# Patient Record
Sex: Female | Born: 1940 | Race: White | Hispanic: No | Smoking: Never smoker
Health system: Southern US, Community
[De-identification: ages and names within clinical notes are randomized; demographics above are authoritative.]

## PROBLEM LIST (undated history)

## (undated) DIAGNOSIS — F419 Anxiety disorder, unspecified: Secondary | ICD-10-CM

## (undated) DIAGNOSIS — G309 Alzheimer's disease, unspecified: Secondary | ICD-10-CM

## (undated) DIAGNOSIS — G252 Other specified forms of tremor: Secondary | ICD-10-CM

## (undated) DIAGNOSIS — G250 Essential tremor: Secondary | ICD-10-CM

## (undated) DIAGNOSIS — R269 Unspecified abnormalities of gait and mobility: Secondary | ICD-10-CM

## (undated) DIAGNOSIS — E785 Hyperlipidemia, unspecified: Secondary | ICD-10-CM

## (undated) DIAGNOSIS — I10 Essential (primary) hypertension: Secondary | ICD-10-CM

## (undated) DIAGNOSIS — F0280 Dementia in other diseases classified elsewhere without behavioral disturbance: Secondary | ICD-10-CM

## (undated) DIAGNOSIS — R413 Other amnesia: Principal | ICD-10-CM

## (undated) DIAGNOSIS — F0390 Unspecified dementia without behavioral disturbance: Secondary | ICD-10-CM

## (undated) DIAGNOSIS — S0300XA Dislocation of jaw, unspecified side, initial encounter: Secondary | ICD-10-CM

## (undated) DIAGNOSIS — K219 Gastro-esophageal reflux disease without esophagitis: Secondary | ICD-10-CM

## (undated) HISTORY — DX: Other amnesia: R41.3

## (undated) HISTORY — DX: Essential (primary) hypertension: I10

## (undated) HISTORY — DX: Unspecified abnormalities of gait and mobility: R26.9

## (undated) HISTORY — PX: RETINAL TEAR REPAIR CRYOTHERAPY: SHX5304

## (undated) HISTORY — DX: Essential tremor: G25.0

## (undated) HISTORY — DX: Hyperlipidemia, unspecified: E78.5

## (undated) HISTORY — DX: Dislocation of jaw, unspecified side, initial encounter: S03.00XA

## (undated) HISTORY — DX: Anxiety disorder, unspecified: F41.9

## (undated) HISTORY — DX: Unspecified dementia, unspecified severity, without behavioral disturbance, psychotic disturbance, mood disturbance, and anxiety: F03.90

## (undated) HISTORY — DX: Other specified forms of tremor: G25.2

## (undated) HISTORY — DX: Gastro-esophageal reflux disease without esophagitis: K21.9

---

## 2000-05-07 ENCOUNTER — Other Ambulatory Visit: Admission: RE | Admit: 2000-05-07 | Discharge: 2000-05-07 | Payer: Self-pay | Admitting: Obstetrics & Gynecology

## 2000-09-15 ENCOUNTER — Encounter: Admission: RE | Admit: 2000-09-15 | Discharge: 2000-09-15 | Payer: Self-pay | Admitting: Pulmonary Disease

## 2000-09-15 ENCOUNTER — Encounter: Payer: Self-pay | Admitting: Pulmonary Disease

## 2001-06-28 ENCOUNTER — Ambulatory Visit (HOSPITAL_BASED_OUTPATIENT_CLINIC_OR_DEPARTMENT_OTHER): Admission: RE | Admit: 2001-06-28 | Discharge: 2001-06-28 | Payer: Self-pay | Admitting: Orthopedic Surgery

## 2001-12-12 ENCOUNTER — Other Ambulatory Visit: Admission: RE | Admit: 2001-12-12 | Discharge: 2001-12-12 | Payer: Self-pay | Admitting: Family Medicine

## 2002-11-28 ENCOUNTER — Encounter: Payer: Self-pay | Admitting: Family Medicine

## 2002-11-28 ENCOUNTER — Encounter: Admission: RE | Admit: 2002-11-28 | Discharge: 2002-11-28 | Payer: Self-pay | Admitting: Family Medicine

## 2003-12-18 ENCOUNTER — Encounter: Admission: RE | Admit: 2003-12-18 | Discharge: 2003-12-18 | Payer: Self-pay | Admitting: Family Medicine

## 2004-08-27 ENCOUNTER — Other Ambulatory Visit: Admission: RE | Admit: 2004-08-27 | Discharge: 2004-08-27 | Payer: Self-pay | Admitting: Family Medicine

## 2005-01-02 ENCOUNTER — Ambulatory Visit: Payer: Self-pay | Admitting: Family Medicine

## 2005-03-30 ENCOUNTER — Ambulatory Visit: Payer: Self-pay | Admitting: Family Medicine

## 2005-07-31 ENCOUNTER — Ambulatory Visit: Payer: Self-pay | Admitting: Family Medicine

## 2005-08-06 ENCOUNTER — Ambulatory Visit: Payer: Self-pay | Admitting: Family Medicine

## 2006-10-11 ENCOUNTER — Encounter: Admission: RE | Admit: 2006-10-11 | Discharge: 2006-10-11 | Payer: Self-pay | Admitting: Internal Medicine

## 2009-03-01 ENCOUNTER — Encounter: Admission: RE | Admit: 2009-03-01 | Discharge: 2009-03-01 | Payer: Self-pay | Admitting: Internal Medicine

## 2010-05-25 ENCOUNTER — Inpatient Hospital Stay (HOSPITAL_COMMUNITY): Admission: EM | Admit: 2010-05-25 | Discharge: 2010-05-27 | Payer: Self-pay | Admitting: Emergency Medicine

## 2010-12-21 ENCOUNTER — Encounter: Payer: Self-pay | Admitting: Internal Medicine

## 2011-02-15 LAB — CK TOTAL AND CKMB (NOT AT ARMC)
CK, MB: 3.2 ng/mL (ref 0.3–4.0)
CK, MB: 3.4 ng/mL (ref 0.3–4.0)
Relative Index: 1 (ref 0.0–2.5)
Relative Index: 1.3 (ref 0.0–2.5)
Relative Index: 1.4 (ref 0.0–2.5)
Total CK: 241 U/L — ABNORMAL HIGH (ref 7–177)
Total CK: 246 U/L — ABNORMAL HIGH (ref 7–177)

## 2011-02-15 LAB — BASIC METABOLIC PANEL
BUN: 5 mg/dL — ABNORMAL LOW (ref 6–23)
BUN: 5 mg/dL — ABNORMAL LOW (ref 6–23)
CO2: 28 mEq/L (ref 19–32)
CO2: 29 mEq/L (ref 19–32)
Calcium: 8.8 mg/dL (ref 8.4–10.5)
Calcium: 8.9 mg/dL (ref 8.4–10.5)
Chloride: 105 mEq/L (ref 96–112)
Chloride: 108 mEq/L (ref 96–112)
Creatinine, Ser: 0.71 mg/dL (ref 0.4–1.2)
Creatinine, Ser: 0.78 mg/dL (ref 0.4–1.2)
GFR calc Af Amer: >60 mL/min (ref >60)
GFR calc Af Amer: >60 mL/min (ref >60)
GFR calc non Af Amer: >60 mL/min (ref >60)
GFR calc non Af Amer: >60 mL/min (ref >60)
Glucose, Bld: 112 mg/dL — ABNORMAL HIGH (ref 70–99)
Glucose, Bld: 115 mg/dL — ABNORMAL HIGH (ref 70–99)
Potassium: 3.6 mEq/L (ref 3.5–5.1)
Potassium: 3.9 mEq/L (ref 3.5–5.1)
Sodium: 141 mEq/L (ref 135–145)
Sodium: 141 mEq/L (ref 135–145)

## 2011-02-15 LAB — TROPONIN I
Troponin I: 0.01 ng/mL (ref 0.00–0.06)
Troponin I: 0.01 ng/mL (ref 0.00–0.06)
Troponin I: 0.01 ng/mL (ref 0.00–0.06)

## 2011-02-15 LAB — COMPREHENSIVE METABOLIC PANEL
ALT: 13 U/L (ref 0–35)
AST: 19 U/L (ref 0–37)
CO2: 29 mEq/L (ref 19–32)
Calcium: 8.6 mg/dL (ref 8.4–10.5)
Chloride: 99 mEq/L (ref 96–112)
Creatinine, Ser: 0.76 mg/dL (ref 0.4–1.2)
Creatinine, Ser: 0.83 mg/dL (ref 0.4–1.2)
GFR calc Af Amer: >60 mL/min (ref >60)
GFR calc Af Amer: >60 mL/min (ref >60)
GFR calc non Af Amer: >60 mL/min (ref >60)
Glucose, Bld: 119 mg/dL — ABNORMAL HIGH (ref 70–99)
Glucose, Bld: 142 mg/dL — ABNORMAL HIGH (ref 70–99)
Sodium: 141 mEq/L (ref 135–145)
Total Bilirubin: 0.7 mg/dL (ref 0.3–1.2)
Total Protein: 5.6 g/dL — ABNORMAL LOW (ref 6.0–8.3)
Total Protein: 7.5 g/dL (ref 6.0–8.3)

## 2011-02-15 LAB — FOLATE: Folate: >20 ng/mL

## 2011-02-15 LAB — RETICULOCYTES
RBC.: 3.66 MIL/uL — ABNORMAL LOW (ref 3.87–5.11)
Retic Count, Absolute: 40.3 10*3/uL (ref 19.0–186.0)
Retic Ct Pct: 1.1 % (ref 0.4–3.1)

## 2011-02-15 LAB — CBC
HCT: 33.8 % — ABNORMAL LOW (ref 36.0–46.0)
Hemoglobin: 11.6 g/dL — ABNORMAL LOW (ref 12.0–15.0)
Hemoglobin: 13.7 g/dL (ref 12.0–15.0)
MCH: 34.9 pg — ABNORMAL HIGH (ref 26.0–34.0)
MCH: 35.3 pg — ABNORMAL HIGH (ref 26.0–34.0)
MCHC: 34.5 g/dL (ref 30.0–36.0)
MCHC: 35.2 g/dL (ref 30.0–36.0)
MCV: 101.3 fL — ABNORMAL HIGH (ref 78.0–100.0)
Platelets: 223 10*3/uL (ref 150–400)
Platelets: 269 10*3/uL (ref 150–400)
RBC: 3.33 MIL/uL — ABNORMAL LOW (ref 3.87–5.11)
RDW: 13.1 % (ref 11.5–15.5)
RDW: 13.2 % (ref 11.5–15.5)
RDW: 13.4 % (ref 11.5–15.5)
WBC: 8.5 10*3/uL (ref 4.0–10.5)

## 2011-02-15 LAB — IRON AND TIBC
Iron: 45 ug/dL (ref 42–135)
UIBC: 262 ug/dL

## 2011-02-15 LAB — URINALYSIS, ROUTINE W REFLEX MICROSCOPIC
Glucose, UA: NEGATIVE mg/dL
Specific Gravity, Urine: 1.024 (ref 1.005–1.030)
Urobilinogen, UA: 1 mg/dL (ref 0.0–1.0)

## 2011-02-15 LAB — SAMPLE TO BLOOD BANK

## 2011-02-15 LAB — PROTIME-INR: INR: 1.13 (ref 0.00–1.49)

## 2011-02-15 LAB — DIFFERENTIAL
Eosinophils Absolute: 0.2 10*3/uL (ref 0.0–0.7)
Eosinophils Relative: 1 % (ref 0–5)
Lymphocytes Relative: 12 % (ref 12–46)
Lymphocytes Relative: 32 % (ref 12–46)
Lymphs Abs: 2.6 10*3/uL (ref 0.7–4.0)
Monocytes Relative: 8 % (ref 3–12)
Neutro Abs: 9.3 10*3/uL — ABNORMAL HIGH (ref 1.7–7.7)
Neutrophils Relative %: 57 % (ref 43–77)
Neutrophils Relative %: 81 % — ABNORMAL HIGH (ref 43–77)

## 2011-02-15 LAB — POCT CARDIAC MARKERS: Troponin i, poc: <0.05 ng/mL (ref 0.00–0.09)

## 2011-02-15 LAB — URINE MICROSCOPIC-ADD ON

## 2011-02-15 LAB — FOLATE RBC: RBC Folate: 728 ng/mL — ABNORMAL HIGH (ref 180–600)

## 2011-02-15 LAB — FERRITIN: Ferritin: 98 ng/mL (ref 10–291)

## 2011-02-15 LAB — LIPASE, BLOOD: Lipase: 43 U/L (ref 11–59)

## 2011-04-17 NOTE — Op Note (Signed)
[name]  Patient:    [name], Theresa Sloan                      MRN: [id number] Proc. Date: 06/28/01 Attending:  Nicki Reaper, M.D. CC:         Nicki Reaper, M.D. (2)   Operative Report  PREOPERATIVE DIAGNOSIS:  _______ ________ right hand.  POSTOPERATIVE DIAGNOSIS: _______ ________ right hand.  OPERATION:  Release right carpal tunnel.  SURGEON:  Nicki Reaper, M.D.  ASSISTANT:  Joaquin Courts, R.N.  ANESTHESIA:  Forearm based IV regional.  ANESTHESIOLOGIST:  Janetta Hora. Gelene Mink, M.D.  INDICATIONS:  The patient is a 70 year old female with a history of carpal tunnel syndrome.  EMG and nerve conductions positive, which has not responded to conservative treatment.  DESCRIPTION OF PROCEDURE:  The patient is brought to the operating room where a forearm based IV regional anesthetic was carried out without difficulty. She was prepped and draped using Betadine scrub and solution with the right free.  A longitudinal incision was made in the palm and carried down through the subcutaneous tissue.  Bleeders were electrocauterized.  The palmar fascia was split.  The superficial palmar arch was identified.  The flexor tendon of the ring and little finger were identified to the ulnar side of the median nerve.  The carpal retinaculum was incised with sharp dissection.  A right angle and Sewall retractor were placed between skin and forearm fascia.  The fascia was released for approximately 3.0 cm proximal to the wrist crease under direct vision.  The canal was explored and no further lesions were identified.  The wound was irrigated and the skin was closed with interrupted #5-0 nylon sutures.  A sterile compressive dressing and splint were applied.  The patient tolerated the procedure well and was taken to the recovery room for observation in satisfactory condition.  DISPOSITION:  She is discharged home, to return to the [facility] in  one week on Vicodin and Keflex. DD:  06/28/01 TD:  06/28/01 Job: [id number]

## 2011-09-09 ENCOUNTER — Other Ambulatory Visit: Payer: Self-pay | Admitting: Family Medicine

## 2011-09-09 DIAGNOSIS — Z1231 Encounter for screening mammogram for malignant neoplasm of breast: Secondary | ICD-10-CM

## 2011-09-09 DIAGNOSIS — Z780 Asymptomatic menopausal state: Secondary | ICD-10-CM

## 2011-10-06 ENCOUNTER — Ambulatory Visit: Payer: Self-pay

## 2011-10-06 ENCOUNTER — Other Ambulatory Visit: Payer: Self-pay

## 2011-11-02 ENCOUNTER — Other Ambulatory Visit: Payer: Self-pay

## 2011-11-02 ENCOUNTER — Ambulatory Visit: Payer: Self-pay

## 2011-12-02 ENCOUNTER — Ambulatory Visit: Payer: Self-pay

## 2011-12-02 ENCOUNTER — Other Ambulatory Visit: Payer: Self-pay

## 2012-02-26 ENCOUNTER — Ambulatory Visit
Admission: RE | Admit: 2012-02-26 | Discharge: 2012-02-26 | Disposition: A | Discharge status: Home / Self Care | Payer: Medicare Other | Source: Ambulatory Visit | Attending: Family Medicine | Admitting: Family Medicine

## 2012-02-26 DIAGNOSIS — Z1231 Encounter for screening mammogram for malignant neoplasm of breast: Secondary | ICD-10-CM

## 2012-06-09 DIAGNOSIS — H20019 Primary iridocyclitis, unspecified eye: Secondary | ICD-10-CM | POA: Diagnosis not present

## 2012-06-14 DIAGNOSIS — H20019 Primary iridocyclitis, unspecified eye: Secondary | ICD-10-CM | POA: Diagnosis not present

## 2012-06-21 DIAGNOSIS — H20019 Primary iridocyclitis, unspecified eye: Secondary | ICD-10-CM | POA: Diagnosis not present

## 2012-06-27 DIAGNOSIS — H20019 Primary iridocyclitis, unspecified eye: Secondary | ICD-10-CM | POA: Diagnosis not present

## 2012-07-01 DIAGNOSIS — H20019 Primary iridocyclitis, unspecified eye: Secondary | ICD-10-CM | POA: Diagnosis not present

## 2012-07-07 DIAGNOSIS — H35359 Cystoid macular degeneration, unspecified eye: Secondary | ICD-10-CM | POA: Insufficient documentation

## 2012-07-07 DIAGNOSIS — H353190 Nonexudative age-related macular degeneration, unspecified eye, stage unspecified: Secondary | ICD-10-CM | POA: Diagnosis not present

## 2012-07-07 DIAGNOSIS — H35372 Puckering of macula, left eye: Secondary | ICD-10-CM | POA: Insufficient documentation

## 2012-07-07 DIAGNOSIS — H04123 Dry eye syndrome of bilateral lacrimal glands: Secondary | ICD-10-CM | POA: Insufficient documentation

## 2012-07-07 DIAGNOSIS — Z961 Presence of intraocular lens: Secondary | ICD-10-CM | POA: Insufficient documentation

## 2012-07-07 DIAGNOSIS — H209 Unspecified iridocyclitis: Secondary | ICD-10-CM | POA: Insufficient documentation

## 2012-09-30 DIAGNOSIS — M279 Disease of jaws, unspecified: Secondary | ICD-10-CM | POA: Diagnosis not present

## 2013-01-19 DIAGNOSIS — H18059 Posterior corneal pigmentations, unspecified eye: Secondary | ICD-10-CM | POA: Diagnosis not present

## 2013-01-19 DIAGNOSIS — H35369 Drusen (degenerative) of macula, unspecified eye: Secondary | ICD-10-CM | POA: Diagnosis not present

## 2013-01-19 DIAGNOSIS — Z961 Presence of intraocular lens: Secondary | ICD-10-CM | POA: Diagnosis not present

## 2013-01-19 DIAGNOSIS — H33059 Total retinal detachment, unspecified eye: Secondary | ICD-10-CM | POA: Diagnosis not present

## 2013-02-06 DIAGNOSIS — E785 Hyperlipidemia, unspecified: Secondary | ICD-10-CM | POA: Diagnosis not present

## 2013-02-06 DIAGNOSIS — I10 Essential (primary) hypertension: Secondary | ICD-10-CM | POA: Diagnosis not present

## 2013-03-16 ENCOUNTER — Ambulatory Visit: Payer: Self-pay | Admitting: Family Medicine

## 2013-03-22 ENCOUNTER — Ambulatory Visit: Payer: Self-pay | Admitting: Family Medicine

## 2013-03-28 ENCOUNTER — Encounter: Payer: Self-pay | Admitting: Family Medicine

## 2013-03-28 ENCOUNTER — Ambulatory Visit (INDEPENDENT_AMBULATORY_CARE_PROVIDER_SITE_OTHER): Payer: Medicare Other | Admitting: Family Medicine

## 2013-03-28 VITALS — BP 130/76 | HR 64 | Temp 97.8°F | Resp 16 | Wt 143.0 lb

## 2013-03-28 DIAGNOSIS — R109 Unspecified abdominal pain: Secondary | ICD-10-CM

## 2013-03-28 LAB — HEPATIC FUNCTION PANEL
Albumin: 4.2 g/dL (ref 3.5–5.2)
Indirect Bilirubin: 0.4 mg/dL (ref 0.0–0.9)
Total Bilirubin: 0.5 mg/dL (ref 0.3–1.2)
Total Protein: 6.7 g/dL (ref 6.0–8.3)

## 2013-03-28 LAB — CBC WITH DIFFERENTIAL/PLATELET
Basophils Absolute: 0 10*3/uL (ref 0.0–0.1)
Eosinophils Absolute: 0.2 10*3/uL (ref 0.0–0.7)
Eosinophils Relative: 3 % (ref 0–5)
Lymphocytes Relative: 30 % (ref 12–46)
MCV: 96.6 fL (ref 78.0–100.0)
Neutrophils Relative %: 58 % (ref 43–77)
Platelets: 307 10*3/uL (ref 150–400)
RBC: 3.88 MIL/uL (ref 3.87–5.11)
RDW: 13.6 % (ref 11.5–15.5)
WBC: 8.2 10*3/uL (ref 4.0–10.5)

## 2013-03-28 LAB — BASIC METABOLIC PANEL
BUN: 11 mg/dL (ref 6–23)
Creat: 0.92 mg/dL (ref 0.50–1.10)
Glucose, Bld: 94 mg/dL (ref 70–99)
Potassium: 4.5 mEq/L (ref 3.5–5.3)

## 2013-03-28 NOTE — Progress Notes (Signed)
  Subjective:    Patient ID: Theresa Sloan, female    DOB: [birth year], 73 y.o.   MRN: [id number]  HPI Patient presents with 2 weeks of right lower quadrant abdominal pain. The pain is constant in nature. There are no exacerbating or alleviating factors. There is no nausea. No vomiting. No diarrhea. There is no constipation. There is no fever.  She has a normal appetite. She is tolerating food without difficulty. She denies any injury in that area. She denies any melena, hematochezia, or change in bowel habits. She denies any vaginal bleeding. She has been lifting and pulling on her husband performing ADLs due to his severe dementia.   Past history significant for hypertension and hyperlipidemia. Medication list is reviewed She has no known drug allergies .   Review of Systems Review of systems is negative    Objective:   Physical Exam  Constitutional: She appears well-developed and well-nourished. No distress.  Eyes: Conjunctivae are normal. Pupils are equal, round, and reactive to light.  Neck: No JVD present. No thyromegaly present.  Cardiovascular: Normal rate, regular rhythm and normal heart sounds.  Exam reveals no gallop and no friction rub.   No murmur heard. Pulmonary/Chest: Effort normal and breath sounds normal. No respiratory distress. She has no wheezes. She has no rales.  Abdominal: Soft. Bowel sounds are normal. She exhibits no distension and no mass. There is no tenderness. There is no rebound and no guarding.  Lymphadenopathy:    She has no cervical adenopathy.  Skin: She is not diaphoretic.          Assessment & Plan:  Abdominal  pain, other specified site - Plan: Basic Metabolic Panel, CBC with Differential, Hepatic Function Panel  I believe this is likely musculoskeletal pain due to a strained oblique muscle.  Recommended tincture of time with when necessary Tylenol. If symptoms worsen I would proceed with CAT scan of the abdomen and pelvis immediately.  Explained this to the patient. She knows to come back immediately or seek medical attention immediately if she develops fevers or severe worsening pain.  If symptoms are no better we would also pursue CAT scan to evaluate for other causes of abdominal pain. Her exam is nonfocal today. There is no evidence of an acute abdomen

## 2013-03-29 NOTE — Addendum Note (Signed)
Addended by: Lynnea Ferrier on: 03/29/2013 07:20 AM   Modules accepted: Orders

## 2013-04-07 ENCOUNTER — Ambulatory Visit
Admission: RE | Admit: 2013-04-07 | Discharge: 2013-04-07 | Disposition: A | Discharge status: Home / Self Care | Payer: Medicare Other | Source: Ambulatory Visit | Attending: Family Medicine | Admitting: Family Medicine

## 2013-04-07 DIAGNOSIS — R109 Unspecified abdominal pain: Secondary | ICD-10-CM

## 2013-04-07 DIAGNOSIS — I10 Essential (primary) hypertension: Secondary | ICD-10-CM | POA: Diagnosis not present

## 2013-04-07 DIAGNOSIS — R7989 Other specified abnormal findings of blood chemistry: Secondary | ICD-10-CM | POA: Diagnosis not present

## 2013-09-09 ENCOUNTER — Other Ambulatory Visit: Payer: Self-pay | Admitting: Family Medicine

## 2013-09-10 ENCOUNTER — Other Ambulatory Visit: Payer: Self-pay | Admitting: Family Medicine

## 2013-09-11 NOTE — Telephone Encounter (Signed)
ok 

## 2013-09-11 NOTE — Telephone Encounter (Signed)
?   OK to Refill  

## 2013-11-13 ENCOUNTER — Ambulatory Visit (INDEPENDENT_AMBULATORY_CARE_PROVIDER_SITE_OTHER): Payer: Medicare Other | Admitting: Family Medicine

## 2013-11-13 ENCOUNTER — Encounter: Payer: Self-pay | Admitting: Family Medicine

## 2013-11-13 VITALS — BP 120/80 | HR 82 | Temp 97.0°F | Resp 18 | Wt 126.0 lb

## 2013-11-13 DIAGNOSIS — M26609 Unspecified temporomandibular joint disorder, unspecified side: Secondary | ICD-10-CM

## 2013-11-13 MED ORDER — DICLOFENAC SODIUM 75 MG PO TBEC: 75.0000 mg | DELAYED_RELEASE_TABLET | Freq: Two times a day (BID) | ORAL | Status: DC

## 2013-11-13 MED ORDER — DIAZEPAM 10 MG PO TABS: 10.0000 mg | ORAL_TABLET | Freq: Two times a day (BID) | ORAL | Status: DC | PRN

## 2013-11-13 NOTE — Progress Notes (Signed)
   Subjective:    Patient ID: Theresa Sloan, female    DOB: Sep 03, 1941, 72 y.o.   MRN: [id number]  HPI Patient is a very pleasant 72 year old white female who presents with 2 weeks of persistent daily bilateral ear pain. She denies any fevers or chills. She denies any symptoms of an upper respiratory infection. She denies any sinus pain, rhinorrhea, sinus tenderness, or cough, or postnasal drip. She denies any hearing loss or tinnitus. Of note she is unable to sleep at night because she is caring for her husband who has severe Alzheimer's disease. She sleeps less than 3 hours a night sitting upright in a chair. She is very anxious and grinding her teeth at night. No past medical history on file. Current Outpatient Prescriptions on File Prior to Visit  Medication Sig Dispense Refill  . atenolol (TENORMIN) 50 MG tablet Take 50 mg by mouth daily.      Marland Kitchen atorvastatin (LIPITOR) 80 MG tablet TAKE 1 TABLET BY MOUTH AT BEDTIME  30 tablet  3  . chlorthalidone (HYGROTON) 25 MG tablet Take 25 mg by mouth daily.      . diazepam (VALIUM) 10 MG tablet TAKE 1 TABLET BY MOUTH AT BEDTIME  30 tablet  1   No current facility-administered medications on file prior to visit.   No Known Allergies History   Social History  . Marital Status: Married    Spouse Name: N/A    Number of Children: N/A  . Years of Education: N/A   Occupational History  . Not on file.   Social History Main Topics  . Smoking status: Never Smoker   . Smokeless tobacco: Not on file  . Alcohol Use: No  . Drug Use: No  . Sexual Activity: Not on file   Other Topics Concern  . Not on file   Social History Narrative  . No narrative on file      Review of Systems  All other systems reviewed and are negative.       Objective:   Physical Exam  Vitals reviewed. HENT:  Right Ear: Hearing, tympanic membrane, external ear and ear canal normal.  Left Ear: Hearing, tympanic membrane, external ear and ear canal normal.    Nose: Nose normal.  Mouth/Throat: Uvula is midline, oropharynx is clear and moist and mucous membranes are normal. No oropharyngeal exudate.          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 I recommended Valium 5-10 mg at night 30 minutes before sleep and relax her muscles and allow her to enter deeper sleep. I recommend she wear a mouth guard. I recommend she take diclofenac 75 mg by mouth twice a day for joint swelling and pain. Recheck in 2 weeks if no better or sooner if worse.

## 2013-12-19 DIAGNOSIS — H2510 Age-related nuclear cataract, unspecified eye: Secondary | ICD-10-CM | POA: Diagnosis not present

## 2013-12-20 ENCOUNTER — Ambulatory Visit: Payer: Medicare Other | Admitting: Physician Assistant

## 2013-12-20 ENCOUNTER — Other Ambulatory Visit: Payer: Self-pay | Admitting: Family Medicine

## 2013-12-20 NOTE — Telephone Encounter (Signed)
?   OK to Refill  

## 2013-12-21 ENCOUNTER — Encounter: Payer: Self-pay | Admitting: Physician Assistant

## 2013-12-21 ENCOUNTER — Ambulatory Visit (INDEPENDENT_AMBULATORY_CARE_PROVIDER_SITE_OTHER): Payer: Medicare Other | Admitting: Physician Assistant

## 2013-12-21 VITALS — BP 128/78 | HR 48 | Temp 98.2°F | Resp 18 | Wt 118.0 lb

## 2013-12-21 DIAGNOSIS — G4700 Insomnia, unspecified: Secondary | ICD-10-CM

## 2013-12-21 DIAGNOSIS — K219 Gastro-esophageal reflux disease without esophagitis: Secondary | ICD-10-CM | POA: Diagnosis not present

## 2013-12-21 MED ORDER — OMEPRAZOLE 20 MG PO CPDR: 20.0000 mg | DELAYED_RELEASE_CAPSULE | Freq: Every day | ORAL | Status: DC

## 2013-12-21 MED ORDER — DIAZEPAM 10 MG PO TABS: ORAL_TABLET | ORAL | Status: DC

## 2013-12-21 NOTE — Progress Notes (Signed)
Patient ID: Theresa Sloan MRN: [id number], DOB: 08/26/1941, 73 y.o. Date of Encounter: 12/21/2013, 12:07 PM    Chief Complaint:  Chief Complaint  Patient presents with  . c/o prob with hiatal hernia  reflux is bad    refill valium     HPI: 73 y.o. year old white female states that she had endoscopy performed remotely and this showed a hiatal hernia. However she says that she had no problems with reflux until recently. Recently has been having a lot of heartburn sensation in her epigastric area. Also occasionally she can feel/taste acid coming up into her throat. She notices these symptoms after eating. He has tried to determine if there are certain foods causing this but says that she is recently having the symptoms regardless of what she eats. Has been using TUMS with minimal relief. Tried no other medicines.  She also needs a refill on her Valium. Her past bottle has expired. She gets the 10 mg dose but she cuts them in half. She only uses them at night but does not use them every night. Uses them when she needs them to relax to sleep.    Home Meds: See attached medication section for any medications that were entered at today's visit. The computer does not put those onto this list.The following list is a list of meds entered prior to today's visit.   Current Outpatient Prescriptions on File Prior to Visit  Medication Sig Dispense Refill  . atenolol (TENORMIN) 50 MG tablet Take 50 mg by mouth daily.      Marland Kitchen atorvastatin (LIPITOR) 80 MG tablet TAKE 1 TABLET BY MOUTH AT BEDTIME  30 tablet  3  . chlorthalidone (HYGROTON) 25 MG tablet Take 25 mg by mouth daily.      . diclofenac (VOLTAREN) 75 MG EC tablet Take 1 tablet (75 mg total) by mouth 2 (two) times daily.  30 tablet  0  . valACYclovir (VALTREX) 500 MG tablet Take 500 mg by mouth 2 (two) times daily.       No current facility-administered medications on file prior to visit.    Allergies: No Known Allergies    Review of  Systems: See HPI for pertinent ROS. All other ROS negative.    Physical Exam: Blood pressure 128/78, pulse 48, temperature 98.2 F (36.8 C), temperature source Oral, resp. rate 18, weight 118 lb (53.524 kg)., There is no height on file to calculate BMI. General: WNWD WF.  Appears in no acute distress. Lungs: Clear bilaterally to auscultation without wheezes, rales, or rhonchi. Breathing is unlabored. Heart: Regular rhythm. No murmurs, rubs, or gallops. Abdomen: Soft,  non-distended with normoactive bowel sounds. No hepatomegaly. No rebound/guarding. No obvious abdominal masses. Minimal tenderness with palpation of the epigastric area. No other areas of tenderness. Msk:  Strength and tone normal for age. Extremities/Skin: Warm and dry. Neuro: Alert and oriented X 3. Moves all extremities spontaneously. Gait is normal. CNII-XII grossly in tact. Psych:  Responds to questions appropriately with a normal affect.     ASSESSMENT AND PLAN:  73 y.o. year old female with  1. GERD (gastroesophageal reflux disease) - omeprazole (PRILOSEC) 20 MG capsule; Take 1 capsule (20 mg total) by mouth daily.  Dispense: 30 capsule; Refill: 3 Follow up if symptoms worsen or are not improved in 2 weeks.  2. Insomnia - diazepam (VALIUM) 10 MG tablet; TAKE 1 TABLET BY MOUTH AT BEDTIME  Dispense: 30 tablet; Refill: 1 She cuts these in half and take  one half at night when necessary. Offered to prescribe a 5 mg dose so she would not have to cut them in half but she says that for cost purposes she was to continue them at the 10 mg.  Marin Olp [place], [place], [facility] 12/21/2013 12:07 PM

## 2013-12-21 NOTE — Telephone Encounter (Signed)
ok 

## 2014-01-08 ENCOUNTER — Ambulatory Visit: Payer: Medicare Other | Admitting: Family Medicine

## 2014-01-12 ENCOUNTER — Ambulatory Visit (INDEPENDENT_AMBULATORY_CARE_PROVIDER_SITE_OTHER): Payer: Medicare Other | Admitting: Family Medicine

## 2014-01-12 ENCOUNTER — Encounter: Payer: Self-pay | Admitting: Family Medicine

## 2014-01-12 VITALS — BP 106/62 | HR 58 | Temp 97.1°F | Resp 14 | Ht 62.0 in | Wt 116.0 lb

## 2014-01-12 DIAGNOSIS — R413 Other amnesia: Secondary | ICD-10-CM

## 2014-01-12 DIAGNOSIS — R079 Chest pain, unspecified: Secondary | ICD-10-CM

## 2014-01-12 DIAGNOSIS — E119 Type 2 diabetes mellitus without complications: Secondary | ICD-10-CM | POA: Diagnosis not present

## 2014-01-12 DIAGNOSIS — R634 Abnormal weight loss: Secondary | ICD-10-CM | POA: Diagnosis not present

## 2014-01-12 DIAGNOSIS — N644 Mastodynia: Secondary | ICD-10-CM

## 2014-01-12 NOTE — Progress Notes (Signed)
Subjective:    Patient ID: Theresa Sloan, female    DOB: 03/16/1941, 73 y.o.   MRN: [id number]  HPI Patient is here today with a family member named Tammy.  Her chief complaint was pain in her left breast. However Tammy called me in the hallway and said that they're concerned that the patient is experiencing memory loss. Patient states that she has pain in her left breast left chest on a daily basis. It is throbbing in nature. It is worse with palpation. It is also worse when she lies on that side at night. She was recently seen in our clinic and was diagnosed with GERD and was given omeprazole however Tammy states that the patient is not taking it. At that time the patient is complaining of severe reflux. Today the patient denies any reflux. She denies any shortness of breath. She denies any angina. There is no rash on the left breast. There is no redness in the left breast. I can appreciate no masses in the left breast today on examination.  However the patient was frequently repeating to me things she has told me numerous times in the past. I asked the patient what the date was today. She was unable to tell me what day it was. She is unable to name what month it was. She correctly identified the year as 2015 and season is winter. She was able to name the location 5 out of 5. She was able to repeat 3 objects only with repeated effort. However on recall a few minutes later she was only able to name one of them.  She was able to correctly spell WORLD backward but was unable to perform serial sevens. She was unable to follow two-step command. She is able to correctly identify pen and a watch.  The patient seemed extremely anxious. She is actually tremulous today in the exam room. She seems extremely sad. She is only sleeping one to 2 hours every night due to her anxiety over caring for her husband who has dementia.   Past Medical History  Diagnosis Date  . Hypertension   . Hyperlipidemia   . Anxiety    . GERD (gastroesophageal reflux disease)    Current Outpatient Prescriptions on File Prior to Visit  Medication Sig Dispense Refill  . atenolol (TENORMIN) 50 MG tablet Take 50 mg by mouth daily.      Marland Kitchen atorvastatin (LIPITOR) 80 MG tablet TAKE 1 TABLET BY MOUTH AT BEDTIME  30 tablet  3  . chlorthalidone (HYGROTON) 25 MG tablet Take 25 mg by mouth daily.      . diazepam (VALIUM) 10 MG tablet TAKE 1 TABLET BY MOUTH AT BEDTIME  30 tablet  1  . diazepam (VALIUM) 10 MG tablet TAKE 1 TABLET BY MOUTH AT BEDTIME  30 tablet  1  . diclofenac (VOLTAREN) 75 MG EC tablet Take 1 tablet (75 mg total) by mouth 2 (two) times daily.  30 tablet  0  . omeprazole (PRILOSEC) 20 MG capsule Take 1 capsule (20 mg total) by mouth daily.  30 capsule  3  . valACYclovir (VALTREX) 500 MG tablet Take 500 mg by mouth 2 (two) times daily.       No current facility-administered medications on file prior to visit.   No Known Allergies History   Social History  . Marital Status: Married    Spouse Name: N/A    Number of Children: N/A  . Years of Education: N/A   Occupational History  .  Not on file.   Social History Main Topics  . Smoking status: Never Smoker   . Smokeless tobacco: Not on file  . Alcohol Use: No  . Drug Use: No  . Sexual Activity: Not on file   Other Topics Concern  . Not on file   Social History Narrative  . No narrative on file       Review of Systems  All other systems reviewed and are negative.       Objective:   Physical Exam  Vitals reviewed. Constitutional: She is oriented to person, place, and time. She appears well-developed and well-nourished. No distress.  HENT:  Head: Normocephalic and atraumatic.  Right Ear: External ear normal.  Left Ear: External ear normal.  Nose: Nose normal.  Mouth/Throat: No oropharyngeal exudate.  Neck: Neck supple. No thyromegaly present.  Cardiovascular: Normal rate, regular rhythm, normal heart sounds and intact distal pulses.   No  murmur heard. Pulmonary/Chest: Effort normal and breath sounds normal. No respiratory distress. She has no wheezes. She has no rales. She exhibits no tenderness.  Abdominal: Soft. Bowel sounds are normal. She exhibits no distension and no mass. There is no tenderness. There is no rebound and no guarding.  Musculoskeletal: She exhibits no edema.  Lymphadenopathy:    She has no cervical adenopathy.  Neurological: She is alert and oriented to person, place, and time. She has normal reflexes. She displays normal reflexes. No cranial nerve deficit. She exhibits normal muscle tone. Coordination normal.  Skin: She is not diaphoretic.   EKG today in the office shows normal sinus rhythm but she is in sinus bradycardia at 45 beats per minute. She has a prolonged QRS interval indicating a nonspecific interventricular conduction block an abnormal left axis deviation. This makes it difficult to interpret her EKG. She has delayed R wave progression in the precordial leads and ST segment depression in 1 and aVL       Assessment & Plan:  1. Breast pain, left Examination of the patient's left breast reveals no abnormalities. I believe she is referring to chest pain.  2. Memory loss Patient's Mini-Mental Status examination is abnormal and significant for moderate dementia. I will obtain an MRI of the brain as well as a CBC CMP and TSH. Also the patient back in one week to discuss all his results. At that time I like to start the patient on Lexapro for anxiety and depression. I have asked her to discontinue Valium as it may contribute to her memory loss. I will also start the patient on Aricept for dementia. - CBC with Differential - COMPLETE METABOLIC PANEL WITH GFR - TSH - MR Brain W Wo Contrast; Future  3. Chest pain I do not feel the patient's chest pain is cardiac in nature. I have asked her to discontinue atenolol due to sinus bradycardia. Given the abnormal nature of her EKG I will consult cardiology  for a stress test. However I believe her chest pain is likely gastrointestinal related. The patient never began omeprazole but she is supposed to take at her last office visit due to her memory problems. Therefore I recommended she begin omeprazole 20 mg by mouth daily and will check the patient back next week. - EKG 12-Lead

## 2014-01-13 LAB — CBC WITH DIFFERENTIAL/PLATELET
BASOS ABS: 0.1 10*3/uL (ref 0.0–0.1)
BASOS PCT: 1 % (ref 0–1)
EOS ABS: 0.1 10*3/uL (ref 0.0–0.7)
EOS PCT: 1 % (ref 0–5)
HCT: 36.1 % (ref 36.0–46.0)
Hemoglobin: 12.7 g/dL (ref 12.0–15.0)
LYMPHS PCT: 33 % (ref 12–46)
Lymphs Abs: 2.9 10*3/uL (ref 0.7–4.0)
MCH: 34 pg (ref 26.0–34.0)
MCHC: 35.2 g/dL (ref 30.0–36.0)
MCV: 96.8 fL (ref 78.0–100.0)
MONO ABS: 0.8 10*3/uL (ref 0.1–1.0)
Monocytes Relative: 9 % (ref 3–12)
Neutro Abs: 5 10*3/uL (ref 1.7–7.7)
Neutrophils Relative %: 56 % (ref 43–77)
PLATELETS: 328 10*3/uL (ref 150–400)
RBC: 3.73 MIL/uL — ABNORMAL LOW (ref 3.87–5.11)
RDW: 14.4 % (ref 11.5–15.5)
WBC: 8.8 10*3/uL (ref 4.0–10.5)

## 2014-01-13 LAB — COMPLETE METABOLIC PANEL WITH GFR
ALT: 13 U/L (ref 0–35)
AST: 22 U/L (ref 0–37)
Albumin: 4.2 g/dL (ref 3.5–5.2)
Alkaline Phosphatase: 90 U/L (ref 39–117)
BILIRUBIN TOTAL: 0.5 mg/dL (ref 0.2–1.2)
BUN: 18 mg/dL (ref 6–23)
CO2: 28 mEq/L (ref 19–32)
CREATININE: 0.85 mg/dL (ref 0.50–1.10)
Calcium: 9.9 mg/dL (ref 8.4–10.5)
Chloride: 99 mEq/L (ref 96–112)
GFR, Est African American: 80 mL/min
GFR, Est Non African American: 69 mL/min
Glucose, Bld: 88 mg/dL (ref 70–99)
Potassium: 3.8 mEq/L (ref 3.5–5.3)
Sodium: 141 mEq/L (ref 135–145)
Total Protein: 7.2 g/dL (ref 6.0–8.3)

## 2014-01-13 LAB — TSH: TSH: 3.119 u[IU]/mL (ref 0.350–4.500)

## 2014-01-17 ENCOUNTER — Encounter: Payer: Self-pay | Admitting: Family Medicine

## 2014-01-19 ENCOUNTER — Encounter: Payer: Self-pay | Admitting: Family Medicine

## 2014-01-19 ENCOUNTER — Ambulatory Visit
Admission: RE | Admit: 2014-01-19 | Discharge: 2014-01-19 | Disposition: A | Discharge status: Home / Self Care | Payer: Medicare Other | Source: Ambulatory Visit | Attending: Family Medicine | Admitting: Family Medicine

## 2014-01-19 ENCOUNTER — Ambulatory Visit (INDEPENDENT_AMBULATORY_CARE_PROVIDER_SITE_OTHER): Payer: Medicare Other | Admitting: Family Medicine

## 2014-01-19 VITALS — BP 126/64 | HR 64 | Temp 96.7°F | Resp 18 | Ht 61.0 in | Wt 113.5 lb

## 2014-01-19 DIAGNOSIS — F418 Other specified anxiety disorders: Secondary | ICD-10-CM

## 2014-01-19 DIAGNOSIS — F341 Dysthymic disorder: Secondary | ICD-10-CM

## 2014-01-19 DIAGNOSIS — R413 Other amnesia: Secondary | ICD-10-CM

## 2014-01-19 DIAGNOSIS — G9389 Other specified disorders of brain: Secondary | ICD-10-CM | POA: Diagnosis not present

## 2014-01-19 MED ORDER — DONEPEZIL HCL 5 MG PO TABS: 5.0000 mg | ORAL_TABLET | Freq: Every day | ORAL | Status: DC

## 2014-01-19 MED ORDER — ESCITALOPRAM OXALATE 10 MG PO TABS: 10.0000 mg | ORAL_TABLET | Freq: Every day | ORAL | Status: DC

## 2014-01-19 MED ORDER — GADOBENATE DIMEGLUMINE 529 MG/ML IV SOLN
10.0000 mL | Freq: Once | INTRAVENOUS | Status: AC | PRN
  Administered 2014-01-19: 10 mL via INTRAVENOUS

## 2014-01-19 NOTE — Progress Notes (Signed)
Subjective:    Patient ID: Theresa Sloan, female    DOB: 26-Oct-1941, 73 y.o.   MRN: [id number]  HPI 01/12/14 Patient is here today with a family member named Tammy.  Her chief complaint was pain in her left breast. However Tammy called me in the hallway and said that they're concerned that the patient is experiencing memory loss. Patient states that she has pain in her left breast/left chest on a daily basis. It is throbbing in nature. It is worse with palpation. It is also worse when she lies on that side at night. She was recently seen in our clinic and was diagnosed with GERD and was given omeprazole however Tammy states that the patient is not taking it. At that time the patient was complaining of severe reflux. Today the patient denies any reflux. She denies any shortness of breath. She denies any angina. There is no rash on the left breast. There is no redness in the left breast. I can appreciate no masses in the left breast today on examination.  However the patient was frequently repeating to me things she has told me numerous times in the past. I asked the patient what the date was today. She was unable to tell me what day it was. She is unable to name what month it was. She correctly identified the year as 2015 and season is winter. She was able to name the location 5 out of 5. She was able to repeat 3 objects only with repeated effort. However on recall a few minutes later she was only able to name one of them.  She was able to correctly spell WORLD backward but was unable to perform serial sevens. She was unable to follow two-step command. She is able to correctly identify pen and a watch.  The patient seemed extremely anxious. She is actually tremulous today in the exam room. She seems extremely sad. She is only sleeping one to 2 hours every night due to her anxiety over caring for her husband who has dementia.  At that time, my plan was:  1. Breast pain, left Examination of the patient's  left breast reveals no abnormalities. I believe she is referring to chest pain.  2. Memory loss Patient's Mini-Mental Status examination is abnormal and significant for moderate dementia. I will obtain an MRI of the brain as well as a CBC CMP and TSH. Also the patient back in one week to discuss all his results. At that time I like to start the patient on Lexapro for anxiety and depression. I have asked her to discontinue Valium as it may contribute to her memory loss. I will also start the patient on Aricept for dementia. - CBC with Differential - COMPLETE METABOLIC PANEL WITH GFR - TSH - MR Brain W Wo Contrast; Future  3. Chest pain I do not feel the patient's chest pain is cardiac in nature. I have asked her to discontinue atenolol due to sinus bradycardia. Given the abnormal nature of her EKG I will consult cardiology for a stress test. However I believe her chest pain is likely gastrointestinal related. The patient never began omeprazole that she was supposed to take at her last office visit due to her memory problems. Therefore I recommended she begin omeprazole 20 mg by mouth daily and will check the patient back next week. - EKG 12-Lead  01/19/13 Patient is here today for follow up.  Her chest pain has completely stopped after she began the omeprazole.  She is adamant that she is not having chest pain. Since discontinuing the atenolol her blood pressure is still in a therapeutic range and her heart rate has improved to 64 beats per minute. Her lab work is normal and shows no reversible causes of memory loss. The patient is scheduled for an MRI this evening. I do not have the results of the MRI back at this time.  In addition to the memory loss, the family states that the patient is extremely negative. She is having mood swings. She is unable to sleep at night. She seemed extremely anxious today in the exam room. She is visibly trembling at times.  They are extremely concerned because she  recently forgot who an individual was that her son has been dating for 8 months.  She doesn't remember ever knowing that individual.  Past Medical History  Diagnosis Date  . Hypertension   . Hyperlipidemia   . Anxiety   . GERD (gastroesophageal reflux disease)    Current Outpatient Prescriptions on File Prior to Visit  Medication Sig Dispense Refill  . atenolol (TENORMIN) 50 MG tablet Take 50 mg by mouth daily.      Marland Kitchen atorvastatin (LIPITOR) 80 MG tablet TAKE 1 TABLET BY MOUTH AT BEDTIME  30 tablet  3  . chlorthalidone (HYGROTON) 25 MG tablet Take 25 mg by mouth daily.      . diazepam (VALIUM) 10 MG tablet TAKE 1 TABLET BY MOUTH AT BEDTIME  30 tablet  1  . diazepam (VALIUM) 10 MG tablet TAKE 1 TABLET BY MOUTH AT BEDTIME  30 tablet  1  . diclofenac (VOLTAREN) 75 MG EC tablet Take 1 tablet (75 mg total) by mouth 2 (two) times daily.  30 tablet  0  . omeprazole (PRILOSEC) 20 MG capsule Take 1 capsule (20 mg total) by mouth daily.  30 capsule  3  . valACYclovir (VALTREX) 500 MG tablet Take 500 mg by mouth 2 (two) times daily.       No current facility-administered medications on file prior to visit.   No Known Allergies History   Social History  . Marital Status: Married    Spouse Name: N/A    Number of Children: N/A  . Years of Education: N/A   Occupational History  . Not on file.   Social History Main Topics  . Smoking status: Never Smoker   . Smokeless tobacco: Not on file  . Alcohol Use: No  . Drug Use: No  . Sexual Activity: Not on file   Other Topics Concern  . Not on file   Social History Narrative  . No narrative on file       Review of Systems  All other systems reviewed and are negative.       Objective:   Physical Exam  Vitals reviewed. Constitutional: She is oriented to person, place, and time. She appears well-developed and well-nourished. No distress.  HENT:  Head: Normocephalic and atraumatic.  Right Ear: External ear normal.  Left Ear:  External ear normal.  Nose: Nose normal.  Mouth/Throat: No oropharyngeal exudate.  Neck: Neck supple. No thyromegaly present.  Cardiovascular: Normal rate, regular rhythm, normal heart sounds and intact distal pulses.   No murmur heard. Pulmonary/Chest: Effort normal and breath sounds normal. No respiratory distress. She has no wheezes. She has no rales. She exhibits no tenderness.  Abdominal: Soft. Bowel sounds are normal. She exhibits no distension and no mass. There is no tenderness. There is no rebound and no  guarding.  Musculoskeletal: She exhibits no edema.  Lymphadenopathy:    She has no cervical adenopathy.  Neurological: She is alert and oriented to person, place, and time. She has normal reflexes. No cranial nerve deficit. She exhibits normal muscle tone. Coordination normal.  Skin: She is not diaphoretic.         Assessment & Plan:  1. Memory loss  2. Depression with anxiety  - donepezil (ARICEPT) 5 MG tablet; Take 1 tablet (5 mg total) by mouth at bedtime.  Dispense: 30 tablet; Refill: 1 - escitalopram (LEXAPRO) 10 MG tablet; Take 1 tablet (10 mg total) by mouth daily.  Dispense: 30 tablet; Refill: 5  I will start the patient on Aricept 5 mg by mouth daily.  However given the stress that the patient is under, her insomnia, her anxiety, and her depression, I am concerned that her mood problems may also be contributing to her memory loss.  I am going to start treating her depression with Lexapro 10 mg by mouth daily. I will see the patient back in one month. At that time we'll repeat Mini-Mental Status Examination to see if there is any signs of improvement since starting the Lexapro. If the patient is not improving on the Lexapro we may discontinue that medication. Rapidly try to increase the dose of Aricept as the patient will tolerate. In the future we will also consider adding namenda xr.  The patient's EKG in the office today is unchanged. She still has signs of a left bundle  branch block. Therefore I am unable to evaluate for ischemia. However the patient is now asymptomatic with no chest pain. I believe her chest pain was gastrointestinal in nature. If the patient is no longer having chest pain I believe she can cancel her cardiology appointment.

## 2014-01-22 ENCOUNTER — Encounter: Payer: Self-pay | Admitting: *Deleted

## 2014-01-23 ENCOUNTER — Telehealth: Payer: Self-pay | Admitting: *Deleted

## 2014-01-23 ENCOUNTER — Institutional Professional Consult (permissible substitution): Payer: Medicare Other | Admitting: Cardiology

## 2014-01-23 NOTE — Telephone Encounter (Signed)
Called with results and left message.

## 2014-01-23 NOTE — Telephone Encounter (Signed)
Received call from Wonda Cheng, patient daughter.   Requested information regarding MRI results.   Call back number [phone].  States that if VM obtained, results can be left.

## 2014-02-07 ENCOUNTER — Encounter: Payer: Self-pay | Admitting: Family Medicine

## 2014-02-16 ENCOUNTER — Telehealth: Payer: Self-pay | Admitting: Family Medicine

## 2014-02-16 NOTE — Telephone Encounter (Signed)
Call back number is [phone] Maudie Mercury is calling about her mother apt she will not be able to come with her mother and she is wanting to let Dr Dennard Schaumann know some things.  Her mother is on her own medication her mother takes aerscet and depression medications, meds are doing good, it is okay to put her on another medication to go with aerscet if that is what he thinks. And her mothers breast hurt all way up to upper back near shoulders she thinks it could be from [place] husband around. They did cancel cardiologist apt. Maudie Mercury is wanting to know if Dr Dennard Schaumann could say something about her mother making comment of wanting to die, she says it all the time, it worries her daughter, Maudie Mercury is wanting to know can you increase the depression medication. Maudie Mercury also states that he is noticing that her mothers head and hands shaking more than normal. Pt is taking her medication Maudie Mercury goes over every night to make sure. If any questions please fell free call

## 2014-02-22 ENCOUNTER — Encounter: Payer: Self-pay | Admitting: Family Medicine

## 2014-02-22 ENCOUNTER — Ambulatory Visit (INDEPENDENT_AMBULATORY_CARE_PROVIDER_SITE_OTHER): Payer: Medicare Other | Admitting: Family Medicine

## 2014-02-22 VITALS — BP 150/70 | HR 70 | Temp 97.6°F | Resp 16 | Ht 62.0 in | Wt 112.0 lb

## 2014-02-22 DIAGNOSIS — F341 Dysthymic disorder: Secondary | ICD-10-CM

## 2014-02-22 DIAGNOSIS — F418 Other specified anxiety disorders: Secondary | ICD-10-CM

## 2014-02-22 MED ORDER — ESCITALOPRAM OXALATE 10 MG PO TABS: 10.0000 mg | ORAL_TABLET | Freq: Every day | ORAL | Status: DC

## 2014-02-22 MED ORDER — DONEPEZIL HCL 10 MG PO TABS: 10.0000 mg | ORAL_TABLET | Freq: Every day | ORAL | Status: DC

## 2014-02-22 NOTE — Progress Notes (Signed)
Subjective:    Patient ID: Theresa Sloan, female    DOB: [birth year], 73 y.o.   MRN: [id number]  HPI 01/12/14 Patient is here today with a family member named Tammy.  Her chief complaint was pain in her left breast. However Tammy called me in the hallway and said that they're concerned that the patient is experiencing memory loss. Patient states that she has pain in her left breast/left chest on a daily basis. It is throbbing in nature. It is worse with palpation. It is also worse when she lies on that side at night. She was recently seen in our clinic and was diagnosed with GERD and was given omeprazole however Tammy states that the patient is not taking it. At that time the patient was complaining of severe reflux. Today the patient denies any reflux. She denies any shortness of breath. She denies any angina. There is no rash on the left breast. There is no redness in the left breast. I can appreciate no masses in the left breast today on examination.  However the patient was frequently repeating to me things she has told me numerous times in the past. I asked the patient what the date was today. She was unable to tell me what day it was. She is unable to name what month it was. She correctly identified the year as 2015 and season is winter. She was able to name the location 5 out of 5. She was able to repeat 3 objects only with repeated effort. However on recall a few minutes later she was only able to name one of them.  She was able to correctly spell WORLD backward but was unable to perform serial sevens. She was unable to follow two-step command. She is able to correctly identify pen and a watch.  The patient seemed extremely anxious. She is actually tremulous today in the exam room. She seems extremely sad. She is only sleeping one to 2 hours every night due to her anxiety over caring for her husband who has dementia.  At that time, my plan was:  1. Breast pain, left Examination of the patient's  left breast reveals no abnormalities. I believe she is referring to chest pain.  2. Memory loss Patient's Mini-Mental Status examination is abnormal and significant for moderate dementia. I will obtain an MRI of the brain as well as a CBC CMP and TSH. Also the patient back in one week to discuss all his results. At that time I like to start the patient on Lexapro for anxiety and depression. I have asked her to discontinue Valium as it may contribute to her memory loss. I will also start the patient on Aricept for dementia. - CBC with Differential - COMPLETE METABOLIC PANEL WITH GFR - TSH - MR Brain W Wo Contrast; Future  3. Chest pain I do not feel the patient's chest pain is cardiac in nature. I have asked her to discontinue atenolol due to sinus bradycardia. Given the abnormal nature of her EKG I will consult cardiology for a stress test. However I believe her chest pain is likely gastrointestinal related. The patient never began omeprazole that she was supposed to take at her last office visit due to her memory problems. Therefore I recommended she begin omeprazole 20 mg by mouth daily and will check the patient back next week. - EKG 12-Lead  01/19/13 Patient is here today for follow up.  Her chest pain has completely stopped after she began the omeprazole.  She is adamant that she is not having chest pain. Since discontinuing the atenolol her blood pressure is still in a therapeutic range and her heart rate has improved to 64 beats per minute. Her lab work is normal and shows no reversible causes of memory loss. The patient is scheduled for an MRI this evening. I do not have the results of the MRI back at this time.  In addition to the memory loss, the family states that the patient is extremely negative. She is having mood swings. She is unable to sleep at night. She seemed extremely anxious today in the exam room. She is visibly trembling at times.  They are extremely concerned because she  recently forgot who an individual was that her son has been dating for 8 months.  She doesn't remember ever knowing that individual.  At that time, my plan was: 1. Memory loss  2. Depression with anxiety  - donepezil (ARICEPT) 5 MG tablet; Take 1 tablet (5 mg total) by mouth at bedtime.  Dispense: 30 tablet; Refill: 1 - escitalopram (LEXAPRO) 10 MG tablet; Take 1 tablet (10 mg total) by mouth daily.  Dispense: 30 tablet; Refill: 5  I will start the patient on Aricept 5 mg by mouth daily.  However given the stress that the patient is under, her insomnia, her anxiety, and her depression, I am concerned that her mood problems may also be contributing to her memory loss.  I am going to start treating her depression with Lexapro 10 mg by mouth daily. I will see the patient back in one month. At that time we'll repeat Mini-Mental Status Examination to see if there is any signs of improvement since starting the Lexapro. If the patient is not improving on the Lexapro we may discontinue that medication. Rapidly try to increase the dose of Aricept as the patient will tolerate. In the future we will also consider adding namenda xr.  The patient's EKG in the office today is unchanged. She still has signs of a left bundle branch block. Therefore I am unable to evaluate for ischemia. However the patient is now asymptomatic with no chest pain. I believe her chest pain was gastrointestinal in nature. If the patient is no longer having chest pain I believe she can cancel her cardiology appointment.   02/22/14 Patient is here today for a recheck. She states that she does feel some better on Lexapro. She is still noticing she is still not resting well but this is primarily due to the situation of caring for her husband with severe dementia. However she does state depression is better. Her son is with her he states that she seems to be doing some better on the depression medication. She is currently taking Aricept 5 mg by  mouth daily for memory loss. Today on her Mini-Mental Status exam she is able to get the correct date was she missed previously. She is able to spell backward. She is able to remember two out of three objects on rapid recall which was not able to perform any previously. She can still not perform serial sevens. However overall her Mini-Mental Status exam is noticeably better since starting the depression medication.  Past Medical History  Diagnosis Date  . Hypertension   . Hyperlipidemia   . Anxiety   . GERD (gastroesophageal reflux disease)    Current Outpatient Prescriptions on File Prior to Visit  Medication Sig Dispense Refill  . atorvastatin (LIPITOR) 80 MG tablet TAKE 1 TABLET BY MOUTH AT  BEDTIME  30 tablet  3  . chlorthalidone (HYGROTON) 25 MG tablet Take 25 mg by mouth daily.      . diclofenac (VOLTAREN) 75 MG EC tablet Take 1 tablet (75 mg total) by mouth 2 (two) times daily.  30 tablet  0  . omeprazole (PRILOSEC) 20 MG capsule Take 1 capsule (20 mg total) by mouth daily.  30 capsule  3   No current facility-administered medications on file prior to visit.   No Known Allergies History   Social History  . Marital Status: Married    Spouse Name: N/A    Number of Children: N/A  . Years of Education: N/A   Occupational History  . Not on file.   Social History Main Topics  . Smoking status: Never Smoker   . Smokeless tobacco: Not on file  . Alcohol Use: No  . Drug Use: No  . Sexual Activity: Not on file   Other Topics Concern  . Not on file   Social History Narrative  . No narrative on file       Review of Systems  All other systems reviewed and are negative.       Objective:   Physical Exam  Vitals reviewed. Constitutional: She is oriented to person, place, and time. She appears well-developed and well-nourished. No distress.  HENT:  Head: Normocephalic and atraumatic.  Right Ear: External ear normal.  Left Ear: External ear normal.  Nose: Nose normal.   Mouth/Throat: No oropharyngeal exudate.  Neck: Neck supple. No thyromegaly present.  Cardiovascular: Normal rate, regular rhythm, normal heart sounds and intact distal pulses.   No murmur heard. Pulmonary/Chest: Effort normal and breath sounds normal. No respiratory distress. She has no wheezes. She has no rales. She exhibits no tenderness.  Abdominal: Soft. Bowel sounds are normal. She exhibits no distension and no mass. There is no tenderness. There is no rebound and no guarding.  Musculoskeletal: She exhibits no edema.  Lymphadenopathy:    She has no cervical adenopathy.  Neurological: She is alert and oriented to person, place, and time. She has normal reflexes. No cranial nerve deficit. She exhibits normal muscle tone. Coordination normal.  Skin: She is not diaphoretic.         Assessment & Plan:  1. Depression with anxiety I will continue Lexapro 10 mg by mouth daily. I do believe the patient still has underlying dementia. Therefore I recommended increasing Aricept to 10 mg by mouth daily. Recheck the patient in 3 months. Consider adding namenda at that time. - donepezil (ARICEPT) 10 MG tablet; Take 1 tablet (10 mg total) by mouth at bedtime.  Dispense: 30 tablet; Refill: 5 - escitalopram (LEXAPRO) 10 MG tablet; Take 1 tablet (10 mg total) by mouth daily.  Dispense: 30 tablet; Refill: 5\

## 2014-02-23 ENCOUNTER — Other Ambulatory Visit: Payer: Self-pay | Admitting: Family Medicine

## 2014-02-23 DIAGNOSIS — Z1211 Encounter for screening for malignant neoplasm of colon: Secondary | ICD-10-CM

## 2014-02-23 DIAGNOSIS — M810 Age-related osteoporosis without current pathological fracture: Secondary | ICD-10-CM

## 2014-02-23 DIAGNOSIS — Z79899 Other long term (current) drug therapy: Secondary | ICD-10-CM

## 2014-03-20 ENCOUNTER — Other Ambulatory Visit: Payer: Self-pay | Admitting: Family Medicine

## 2014-04-12 ENCOUNTER — Telehealth: Payer: Self-pay | Admitting: *Deleted

## 2014-04-12 MED ORDER — ALPRAZOLAM 0.5 MG PO TABS: 0.5000 mg | ORAL_TABLET | Freq: Three times a day (TID) | ORAL | Status: DC

## 2014-04-12 NOTE — Telephone Encounter (Signed)
Pt is not on xanax - do you want to call her in some??

## 2014-04-12 NOTE — Telephone Encounter (Signed)
Patient has mild dementia and memory problems are permanent and will likely get worse over time.  The shaky hands are likely due to stress.  I refilled her xanax this morning and she can use this as needed for anxiety.

## 2014-04-12 NOTE — Addendum Note (Signed)
Addended by: Shary Decamp B on: 04/12/2014 05:14 PM   Modules accepted: Orders

## 2014-04-12 NOTE — Telephone Encounter (Signed)
Med called out and pts dtr aware

## 2014-04-12 NOTE — Telephone Encounter (Signed)
Yes xanax .5 mg po q 8 hrs prn anxiety.  (30)

## 2014-04-12 NOTE — Telephone Encounter (Signed)
Theresa Sloan pts daughter called stating that she is concerned about her mother, she feels she is having problems again with her memory and also both her hands are very shaky. Joelene Millin did say that pts husband is going into [place] place today and not expecting him to live long and thinks it may be stress that is causing memory issues. She wants to know if there is anything she needs to do to help with this. Please advise.

## 2014-04-19 ENCOUNTER — Telehealth: Payer: Self-pay | Admitting: Family Medicine

## 2014-04-19 NOTE — Telephone Encounter (Signed)
Patients dtr aware and list left up front

## 2014-04-19 NOTE — Telephone Encounter (Signed)
Daughter called to let you know that her husband passes last night. Also they are confused about what medication you took her off of. Maudie Mercury (the daughter) states that you took her off the atenolol but also wants to make sure you did not want her on Lipitor anymore either????  (FYI - Print medication list after updating and leave up front and Maudie Mercury will pick up tomorrow)

## 2014-04-19 NOTE — Telephone Encounter (Signed)
I did not stop any heart, cholesterol, or BP med.  She needs to continue all of these.  I want her to stop valium.  I gave her xanax recently to deal with husband's death/illness.  I would like to stop that asap too because of her dementia.

## 2014-04-25 ENCOUNTER — Other Ambulatory Visit: Payer: Self-pay | Admitting: Family Medicine

## 2014-04-25 MED ORDER — CHLORTHALIDONE 25 MG PO TABS: 25.0000 mg | ORAL_TABLET | Freq: Every day | ORAL | Status: DC

## 2014-05-14 DIAGNOSIS — H04129 Dry eye syndrome of unspecified lacrimal gland: Secondary | ICD-10-CM | POA: Diagnosis not present

## 2014-05-23 ENCOUNTER — Telehealth: Payer: Self-pay | Admitting: Family Medicine

## 2014-05-23 DIAGNOSIS — F0391 Unspecified dementia with behavioral disturbance: Secondary | ICD-10-CM

## 2014-05-23 NOTE — Telephone Encounter (Signed)
Pt's daughter is calling stating that she wants to know if we can refer pt to see [facility] b/c the pt is having increased memory problems, no eating is down to 98lbs and she is forgetting where she puts everything and is getting more hateful. She is also wondering if we can increase her depression medication and maybe this would help???

## 2014-05-24 NOTE — Telephone Encounter (Signed)
I am okay with neuro referral, we could also add namenda xr.

## 2014-05-24 NOTE — Telephone Encounter (Signed)
LMOVM about referral and to call me back and would explain Namenda. Referral put in for Neuro.

## 2014-05-31 ENCOUNTER — Ambulatory Visit: Payer: Medicare Other | Admitting: Neurology

## 2014-06-04 ENCOUNTER — Ambulatory Visit: Payer: Medicare Other | Admitting: Neurology

## 2014-06-04 ENCOUNTER — Telehealth: Payer: Self-pay | Admitting: Neurology

## 2014-06-04 NOTE — Telephone Encounter (Signed)
Patient no showed for an appointment today, 06/04/2014, at 1400.

## 2014-06-13 ENCOUNTER — Other Ambulatory Visit: Payer: Self-pay | Admitting: Physician Assistant

## 2014-06-13 NOTE — Telephone Encounter (Signed)
Prescription sent to pharmacy.

## 2014-06-18 ENCOUNTER — Ambulatory Visit: Payer: Medicare Other | Admitting: Neurology

## 2014-06-20 DIAGNOSIS — M26629 Arthralgia of temporomandibular joint, unspecified side: Secondary | ICD-10-CM | POA: Diagnosis not present

## 2014-06-20 DIAGNOSIS — J310 Chronic rhinitis: Secondary | ICD-10-CM | POA: Diagnosis not present

## 2014-06-20 DIAGNOSIS — H9209 Otalgia, unspecified ear: Secondary | ICD-10-CM | POA: Diagnosis not present

## 2014-07-13 DIAGNOSIS — L650 Telogen effluvium: Secondary | ICD-10-CM | POA: Diagnosis not present

## 2014-08-07 ENCOUNTER — Encounter (INDEPENDENT_AMBULATORY_CARE_PROVIDER_SITE_OTHER): Payer: Self-pay

## 2014-08-07 ENCOUNTER — Encounter: Payer: Self-pay | Admitting: Neurology

## 2014-08-07 ENCOUNTER — Ambulatory Visit (INDEPENDENT_AMBULATORY_CARE_PROVIDER_SITE_OTHER): Payer: Medicare Other | Admitting: Neurology

## 2014-08-07 VITALS — BP 126/70 | HR 60 | Ht 62.0 in | Wt 112.0 lb

## 2014-08-07 DIAGNOSIS — R6889 Other general symptoms and signs: Secondary | ICD-10-CM | POA: Diagnosis not present

## 2014-08-07 DIAGNOSIS — R413 Other amnesia: Secondary | ICD-10-CM | POA: Diagnosis not present

## 2014-08-07 DIAGNOSIS — D519 Vitamin B12 deficiency anemia, unspecified: Secondary | ICD-10-CM

## 2014-08-07 DIAGNOSIS — G250 Essential tremor: Secondary | ICD-10-CM | POA: Diagnosis not present

## 2014-08-07 DIAGNOSIS — G252 Other specified forms of tremor: Secondary | ICD-10-CM | POA: Diagnosis not present

## 2014-08-07 DIAGNOSIS — D518 Other vitamin B12 deficiency anemias: Secondary | ICD-10-CM

## 2014-08-07 HISTORY — DX: Essential tremor: G25.0

## 2014-08-07 HISTORY — DX: Other amnesia: R41.3

## 2014-08-07 NOTE — Progress Notes (Signed)
Reason for visit: Memory disturbance  Theresa Sloan is a 73 y.o. female  History of present illness:  Theresa Sloan is a 73 year old right-handed white female with a history of a progressive memory disturbance that has been present for greater than one year. The patient is on Aricept for least one year, and she seems to have tolerated the medication fairly well without weight loss or diarrhea. 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 The patient has had some significant issues with depression since that time, and she has begun to lose weight. The patient is on Lexapro. She reports some occasional episodes of dizziness with standing up rapidly. She has developed tremors that have affected the head and neck, and the left greater than right arms over the last month or so. The patient had MRI evaluation of the brain in February 2015 showing minimal small vessel changes. The patient also underwent a thyroid profile in February that was unremarkable. She indicates that she is sleeping fairly well, and she denies any numbness or weakness of the extremities. The patient has not had any falls. She denies issues controlling the bowels or the bladder. The tremor currently does not prevent her from doing activities of daily living. The patient indicates that her sister also has a similar tremor. She comes to this office for an evaluation. She reports the family indicates that she is having an alteration in behavior with repeating herself, and hiding things in the house. There is some change in personality.  Past Medical History  Diagnosis Date  . Hypertension   . Hyperlipidemia   . Anxiety   . GERD (gastroesophageal reflux disease)   . TMJ (dislocation of temporomandibular joint)   . Memory difficulty 08/07/2014  . 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 Retinal tear repair cryotherapy    . Cesarean  section  1992    Family History  Problem Relation Age of Onset  . Hypertension Sister   . Tremor Sister     Social history:  reports that she has never smoked. She has never used smokeless tobacco. She reports that she does not drink alcohol or use illicit drugs.  Medications:  Current Outpatient Prescriptions on File Prior to Visit  Medication Sig Dispense Refill  . ALPRAZolam (XANAX) 0.5 MG tablet Take 1 tablet (0.5 mg total) by mouth every 8 (eight) hours.  30 tablet  0  . chlorthalidone (HYGROTON) 25 MG tablet Take 1 tablet (25 mg total) by mouth daily.  30 tablet  5  . diclofenac (VOLTAREN) 75 MG EC tablet Take 1 tablet (75 mg total) by mouth 2 (two) times daily.  30 tablet  0  . donepezil (ARICEPT) 10 MG tablet Take 1 tablet (10 mg total) by mouth at bedtime.  30 tablet  5  . escitalopram (LEXAPRO) 10 MG tablet Take 1 tablet (10 mg total) by mouth daily.  30 tablet  5  . omeprazole (PRILOSEC) 20 MG capsule TAKE ONE CAPSULE EVERY DAY  90 capsule  3   No current facility-administered medications on file prior to visit.     No Known Allergies  ROS:  Out of a complete 14 system review of symptoms, the patient complains only of the following symptoms, and all other reviewed systems are negative.  Memory disturbance, confusion, headache, dizziness Heart murmur Allergies, runny nose Depression, anxiety, change in appetite Moles  Blood pressure 126/70, pulse 60, height 5\' 2"  (1.575 m), weight 112 lb (50.803 kg).  Physical Exam  General: The patient is alert and cooperative at the time of the examination.  Eyes: Pupils are equal, round, and reactive to light. Discs are flat bilaterally.  Neck: The neck is supple, no carotid bruits are noted.  Respiratory: The respiratory examination is clear.  Cardiovascular: The cardiovascular examination reveals a regular rate and rhythm, no obvious murmurs or rubs are noted.  Skin: Extremities are without significant  edema.  Neurologic Exam  Mental status: The Mini-Mental status examination done today shows a total score 22/30.  Cranial nerves: Facial symmetry is present. There is good sensation of the face to pinprick and soft touch bilaterally. The strength of the facial muscles and the muscles to head turning and shoulder shrug are normal bilaterally. Speech is well enunciated, no aphasia or dysarthria is noted. Extraocular movements are full. Visual fields are full. The tongue is midline, and the patient has symmetric elevation of the soft palate. No obvious hearing deficits are noted.  Motor: The motor testing reveals 5 over 5 strength of all 4 extremities. Good symmetric motor tone is noted throughout.  Sensory: Sensory testing is intact to pinprick, soft touch, vibration sensation, and position sense on all 4 extremities. No evidence of extinction is noted.  Coordination: Cerebellar testing reveals good finger-nose-finger and heel-to-shin bilaterally. A fine intention tremor seen with finger-nose-finger bilaterally, left greater than right. A side-to-side head tremor is also noted.  Gait and station: Gait is normal. Tandem gait is normal. Romberg is negative. No drift is seen.  Reflexes: Deep tendon reflexes are symmetric and normal bilaterally. Toes are downgoing bilaterally.    MRI brain 01/20/14:  IMPRESSION:  Mild asymmetry of the ventricles, which may be on congenital basis,  overall within normal range for patient's age. No acute intracranial  process.  Mild to moderate white matter changes, as well juxta cortical/  cortical changes suggest chronic small vessel ischemic disease.    Assessment/Plan:  One. Memory disturbance  2. Tremor  The patient likely has an essential tremor, but the tremors are quite fine and could represent an exaggerated physiologic tremor. For this reason, I will check thyroid hormone levels. The patient has been on Aricept for greater than one year. She  will remain on the medication. She will followup in about 6 months. The weight loss issue needs to be followed closely, and if the patient continues to lose weight, the Aricept will need to be reduced. The patient indicates that the depression has been significant, and the Lexapro dosing may need to be increased in the future.  Jill Alexanders MD 08/07/2014 7:16 PM  [facility] [address] [place], [place] [address]  Phone [phone] Fax [phone]

## 2014-08-08 ENCOUNTER — Telehealth: Payer: Self-pay | Admitting: *Deleted

## 2014-08-08 LAB — T4, FREE: FREE T4: 1.08 ng/dL (ref 0.82–1.77)

## 2014-08-08 LAB — T3, FREE: T3, Free: 3.3 pg/mL (ref 2.0–4.4)

## 2014-08-08 LAB — VITAMIN B12: VITAMIN B 12: 433 pg/mL (ref 211–946)

## 2014-08-08 NOTE — Telephone Encounter (Signed)
All labs normal. Patient notified.

## 2014-08-13 ENCOUNTER — Telehealth: Payer: Self-pay | Admitting: Neurology

## 2014-08-13 NOTE — Telephone Encounter (Signed)
Daughther questioning blood work results. Questioning if its dementia or more sore grief after losing her husband. Questioning if she should continue taking donepezil (ARICEPT) 10 MG tablet and escitalopram (LEXAPRO) 10 MG tablet. Please call anytime and may leave detailed message on vm (There's a long pause before vm picks up).

## 2014-08-13 NOTE — Telephone Encounter (Signed)
I called the daughter. The memory issues that the patient has predated the death of her husband, and I suspect that there is truly a dementia at work. Certainly, the grieving process may worsen the cognitive functioning, and the Lexapro may be reasonable to use at this point. The combination of Aricept and Lexapro can be utilized. I would continue both medications.

## 2014-08-13 NOTE — Telephone Encounter (Signed)
Daughther questioning blood work results.  Questioning if its dementia or more sore grief after losing her husband. Questioning if she should continue taking donepezil (ARICEPT) 10 MG tablet and escitalopram (LEXAPRO) 10 MG tablet.  Please call anytime and may leave detailed message on vm (There's a long pause before vm picks up).

## 2014-08-14 ENCOUNTER — Telehealth: Payer: Self-pay | Admitting: Neurology

## 2014-08-14 NOTE — Telephone Encounter (Signed)
Patient's daughter Maudie Mercury calling to speak with a nurse, wants to know how patient did during the memory test, please return call and advise.

## 2014-08-16 NOTE — Telephone Encounter (Signed)
Patient's daughter Theresa Sloan calling to state that she has already signed the HIPAA form and that both her and her son are on it. [name] wants to know how patient did during the memory test, wants to know if she has dementia or Alzheimer's and if a CT scan is needed. Please return call and advise, there is a long pause before the beep for the voicemail.

## 2014-08-17 NOTE — Telephone Encounter (Addendum)
Closed encounter by mistake, left message for daughter to return call and leave a time she is available.  Mini mental 22/30.

## 2014-08-20 NOTE — Telephone Encounter (Signed)
I called the daughter. The patient has already had MRI evaluation the brain earlier in 2015. 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

## 2014-08-20 NOTE — Telephone Encounter (Signed)
Dr. Jannifer Franklin patient's daughter did not call back, but she wanted to if patient has dementia or Alzheimers and if she should have a CT.

## 2014-08-27 ENCOUNTER — Other Ambulatory Visit: Payer: Self-pay | Admitting: Family Medicine

## 2014-08-27 NOTE — Telephone Encounter (Signed)
Refill appropriate and filled per protocol. 

## 2014-09-10 ENCOUNTER — Other Ambulatory Visit: Payer: Self-pay | Admitting: Family Medicine

## 2014-09-10 NOTE — Telephone Encounter (Signed)
Prescription sent to pharmacy.   Patient is overdue for F/U.   Letter sent.

## 2014-10-01 ENCOUNTER — Ambulatory Visit (INDEPENDENT_AMBULATORY_CARE_PROVIDER_SITE_OTHER): Payer: Medicare Other | Admitting: Family Medicine

## 2014-10-01 ENCOUNTER — Encounter: Payer: Self-pay | Admitting: Family Medicine

## 2014-10-01 VITALS — BP 116/70 | HR 60 | Temp 97.5°F | Resp 18 | Wt 114.0 lb

## 2014-10-01 DIAGNOSIS — M7550 Bursitis of unspecified shoulder: Secondary | ICD-10-CM

## 2014-10-01 DIAGNOSIS — J019 Acute sinusitis, unspecified: Secondary | ICD-10-CM

## 2014-10-01 MED ORDER — AMOXICILLIN 875 MG PO TABS: 875.0000 mg | ORAL_TABLET | Freq: Two times a day (BID) | ORAL | Status: DC

## 2014-10-01 MED ORDER — DICLOFENAC SODIUM 75 MG PO TBEC: 75.0000 mg | DELAYED_RELEASE_TABLET | Freq: Two times a day (BID) | ORAL | Status: DC

## 2014-10-01 NOTE — Progress Notes (Signed)
Subjective:    Patient ID: Theresa Sloan, female    DOB: 1940/12/29, 73 y.o.   MRN: [id number]  HPI Patient complains of 3 weeks of pain and pressure behind both eyes and behind both ears.  She has had mild rhinorrhea. She denies any cough or sore throat. She denies any fever. She denies any vision changes. She describes it as a dull constant pain behind her eyeballs and in the area of the frontal sinuses. She also complains of a deep ache within her ears. She is slightly tender to palpation over the right temporal artery. She also complains of 3 weeks of aching pain in both shoulders. It is exacerbated slightly by abduction of shoulder greater than 90 but it does ache constantly. She denies any injury. She denies any pain in her hips.  On examination today, a filling has fallen out of one of her right upper posterior molars. There is no evidence of abscess in this area but the area is tender to the patient. She is scheduled for surgery with her dentist in the next week. Past Medical History  Diagnosis Date  . Hypertension   . Hyperlipidemia   . Anxiety   . GERD (gastroesophageal reflux disease)   . TMJ (dislocation of temporomandibular joint)   . Memory difficulty 08/07/2014  . 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 Retinal tear repair cryotherapy    . Cesarean section  1992   Current Outpatient Prescriptions on File Prior to Visit  Medication Sig Dispense Refill  . ALPRAZolam (XANAX) 0.5 MG tablet Take 1 tablet (0.5 mg total) by mouth every 8 (eight) hours. 30 tablet 0  . chlorthalidone (HYGROTON) 25 MG tablet Take 1 tablet (25 mg total) by mouth daily. 30 tablet 5  . diclofenac (VOLTAREN) 75 MG EC tablet Take 1 tablet (75 mg total) by mouth 2 (two) times daily. 30 tablet 0  . donepezil (ARICEPT) 10 MG tablet TAKE 1 TABLET (10 MG TOTAL) BY MOUTH AT BEDTIME. 30 tablet 5  . escitalopram (LEXAPRO) 10 MG tablet TAKE 1  TABLET (10 MG TOTAL) BY MOUTH DAILY. 30 tablet 5  . Multiple Vitamins-Minerals (PRESERVISION AREDS 2 PO) Take 2 capsules by mouth daily.    Marland Kitchen omeprazole (PRILOSEC) 20 MG capsule TAKE ONE CAPSULE EVERY DAY 90 capsule 3  . valACYclovir (VALTREX) 1000 MG tablet Take 500 mg by mouth 2 (two) times daily.     No current facility-administered medications on file prior to visit.   No Known Allergies History   Social History  . Marital Status: Married    Spouse Name: N/A    Number of Children: 2  . Years of Education: 12 th   Occupational History  . retired    Social History Main Topics  . Smoking status: Never Smoker   . Smokeless tobacco: Never Used  . Alcohol Use: No  . Drug Use: No  . Sexual Activity: Not on file   Other Topics Concern  . Not on file   Social History Narrative      Review of Systems  All other systems reviewed and are negative.      Objective:   Physical Exam  Constitutional: She appears well-developed and well-nourished. No distress.  HENT:  Right Ear: External ear normal.  Left Ear: External ear normal.  Nose: Nose normal.  Mouth/Throat: Oropharynx is clear and moist. No oropharyngeal exudate.  Eyes: Conjunctivae and EOM are  normal. Pupils are equal, round, and reactive to light. No scleral icterus.  Neck: Normal range of motion. Neck supple. No JVD present.  Cardiovascular: Normal rate, regular rhythm and normal heart sounds.   No murmur heard. Pulmonary/Chest: Effort normal and breath sounds normal. No respiratory distress. She has no wheezes. She has no rales.  Musculoskeletal:       Right shoulder: She exhibits decreased range of motion and tenderness.       Left shoulder: She exhibits decreased range of motion and tenderness.  Lymphadenopathy:    She has no cervical adenopathy.  Skin: She is not diaphoretic.  Vitals reviewed.         Assessment & Plan:  Acute rhinosinusitis - Plan: amoxicillin (AMOXIL) 875 MG tablet  Bursitis of  shoulder, unspecified laterality - Plan: diclofenac (VOLTAREN) 75 MG EC tablet   Differential diagnosis includes rhinosinusitis coupled with bursitis in the shoulder, TMJ syndrome causing her ear pain and bursitis in the shoulder, polymyalgia rheumatica as an explanation of both symptoms. I will start the patient on amoxicillin 875 mg by mouth twice a day for 10 days to treat possible sinusitis. I will also start the patient on diclofenac 75 mg by mouth twice a day for bursitis in her shoulder. I would like to recheck the patient later this week on Friday. If her symptoms are improving no further workup is necessary. If the patient's symptoms are worsening, I may try her empirically on prednisone for possible polymyalgia rheumatica. Given her anxiety I'm also concerned about possible TMJ syndrome causing the pain in her ear although there is no palpable crepitus today on examination of the TMJ joint and her pain is not reproducible with chewing. Furthermore TMJ would not explain the pressure behind her eyes or the pain in her shoulders.

## 2014-10-05 ENCOUNTER — Ambulatory Visit (INDEPENDENT_AMBULATORY_CARE_PROVIDER_SITE_OTHER): Payer: Medicare Other | Admitting: Family Medicine

## 2014-10-05 ENCOUNTER — Encounter: Payer: Self-pay | Admitting: Family Medicine

## 2014-10-05 VITALS — BP 132/70 | HR 60 | Temp 97.8°F | Resp 14 | Ht 62.0 in | Wt 114.0 lb

## 2014-10-05 DIAGNOSIS — M26629 Arthralgia of temporomandibular joint, unspecified side: Secondary | ICD-10-CM

## 2014-10-05 DIAGNOSIS — M2662 Arthralgia of temporomandibular joint: Secondary | ICD-10-CM

## 2014-10-05 MED ORDER — DIAZEPAM 10 MG PO TABS: 10.0000 mg | ORAL_TABLET | Freq: Every evening | ORAL | Status: DC | PRN

## 2014-10-05 NOTE — Progress Notes (Signed)
Subjective:    Patient ID: Theresa Sloan, female    DOB: 06-12-1941, 73 y.o.   MRN: [id number]  HPI 10/01/14 Patient complains of 3 weeks of pain and pressure behind both eyes and behind both ears.  She has had mild rhinorrhea. She denies any cough or sore throat. She denies any fever. She denies any vision changes. She describes it as a dull constant pain behind her eyeballs and in the area of the frontal sinuses. She also complains of a deep ache within her ears. She is slightly tender to palpation over the right temporal artery. She also complains of 3 weeks of aching pain in both shoulders. It is exacerbated slightly by abduction of shoulder greater than 90 but it does ache constantly. She denies any injury. She denies any pain in her hips.  On examination today, a filling has fallen out of one of her right upper posterior molars. There is no evidence of abscess in this area but the area is tender to the patient. She is scheduled for surgery with her dentist in the next week.  At that time, my plan was:  Differential diagnosis includes rhinosinusitis coupled with bursitis in the shoulder, TMJ syndrome causing her ear pain and bursitis in the shoulder, polymyalgia rheumatica as an explanation of both symptoms. I will start the patient on amoxicillin 875 mg by mouth twice a day for 10 days to treat possible sinusitis. I will also start the patient on diclofenac 75 mg by mouth twice a day for bursitis in her shoulder. I would like to recheck the patient later this week on Friday. If her symptoms are improving no further workup is necessary. If the patient's symptoms are worsening, I may try her empirically on prednisone for possible polymyalgia rheumatica. Given her anxiety I'm also concerned about possible TMJ syndrome causing the pain in her ear although there is no palpable crepitus today on examination of the TMJ joint and her pain is not reproducible with chewing. Furthermore TMJ would not explain  the pressure behind her eyes or the pain in her shoulders.  10/05/14 Patient is here today for recheck.  The pain in both of her shoulders is essentially gone ever since she started taking the diclofenac making bursitis the most likely explanation. This also makes PMR very unlikely area unfortunately she continues to complain of pressure and pain around both the ureters particularly near the TMJ joint. Today on examination she does have some palpable crepitus in the joint and some tenderness around the joint which was not present when I last examined her. She continues to complain of tension and pain around both eyes although examination of her eyes are normal. Past Medical History  Diagnosis Date  . Hypertension   . Hyperlipidemia   . Anxiety   . GERD (gastroesophageal reflux disease)   . TMJ (dislocation of temporomandibular joint)   . Memory difficulty 08/07/2014  . 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 Retinal tear repair cryotherapy    . Cesarean section  1992   Current Outpatient Prescriptions on File Prior to Visit  Medication Sig Dispense Refill  . ALPRAZolam (XANAX) 0.5 MG tablet Take 1 tablet (0.5 mg total) by mouth every 8 (eight) hours. 30 tablet 0  . amoxicillin (AMOXIL) 875 MG tablet Take 1 tablet (875 mg total) by mouth 2 (two) times daily. 20 tablet 0  . atorvastatin (LIPITOR) 80 MG tablet Take 1  tablet by mouth every evening.  3  . chlorthalidone (HYGROTON) 25 MG tablet Take 1 tablet (25 mg total) by mouth daily. 30 tablet 5  . diclofenac (VOLTAREN) 75 MG EC tablet Take 1 tablet (75 mg total) by mouth 2 (two) times daily. 30 tablet 0  . diclofenac (VOLTAREN) 75 MG EC tablet Take 1 tablet (75 mg total) by mouth 2 (two) times daily. 30 tablet 0  . donepezil (ARICEPT) 10 MG tablet TAKE 1 TABLET (10 MG TOTAL) BY MOUTH AT BEDTIME. 30 tablet 5  . escitalopram (LEXAPRO) 10 MG tablet TAKE 1 TABLET (10 MG TOTAL)  BY MOUTH DAILY. 30 tablet 5  . Multiple Vitamins-Minerals (PRESERVISION AREDS 2 PO) Take 2 capsules by mouth daily.    Marland Kitchen omeprazole (PRILOSEC) 20 MG capsule TAKE ONE CAPSULE EVERY DAY 90 capsule 3  . valACYclovir (VALTREX) 1000 MG tablet Take 500 mg by mouth 2 (two) times daily.     No current facility-administered medications on file prior to visit.   No Known Allergies History   Social History  . Marital Status: Married    Spouse Name: N/A    Number of Children: 2  . Years of Education: 12 th   Occupational History  . retired    Social History Main Topics  . Smoking status: Never Smoker   . Smokeless tobacco: Never Used  . Alcohol Use: No  . Drug Use: No  . Sexual Activity: Not on file   Other Topics Concern  . Not on file   Social History Narrative      Review of Systems  All other systems reviewed and are negative.      Objective:   Physical Exam  Constitutional: She appears well-developed and well-nourished. No distress.  HENT:  Right Ear: External ear normal.  Left Ear: External ear normal.  Nose: Nose normal.  Mouth/Throat: Oropharynx is clear and moist. No oropharyngeal exudate.  Eyes: Conjunctivae and EOM are normal. Pupils are equal, round, and reactive to light. No scleral icterus.  Neck: Normal range of motion. Neck supple. No JVD present.  Cardiovascular: Normal rate, regular rhythm and normal heart sounds.   No murmur heard. Pulmonary/Chest: Effort normal and breath sounds normal. No respiratory distress. She has no wheezes. She has no rales.  Musculoskeletal:       Right shoulder: She exhibits normal range of motion and no tenderness.       Left shoulder: She exhibits normal range of motion and no tenderness.  Lymphadenopathy:    She has no cervical adenopathy.  Skin: She is not diaphoretic.  Vitals reviewed.         Assessment & Plan:  TMJ arthralgia - Plan: diazepam (VALIUM) 10 MG tablet  Most likely explanation for the patient's  ear pain and pressure is TMJ. She is not improving on the amoxicillin and therefore I asked her to discontinue the amoxicillin. I recommended she take Valium 10 mg 30 minutes before bed every night. Also recommended that she wear a mouthguard every night. Continue the diclofenac for the next 2 weeks. Recheck in 2 weeks if no better or sooner if worse

## 2014-10-08 ENCOUNTER — Other Ambulatory Visit: Payer: Self-pay | Admitting: Family Medicine

## 2014-10-11 ENCOUNTER — Encounter: Payer: Self-pay | Admitting: Family Medicine

## 2014-10-17 ENCOUNTER — Encounter: Payer: Self-pay | Admitting: Neurology

## 2014-10-23 ENCOUNTER — Encounter: Payer: Self-pay | Admitting: Neurology

## 2014-11-01 ENCOUNTER — Encounter: Payer: Self-pay | Admitting: Family Medicine

## 2014-11-01 ENCOUNTER — Ambulatory Visit (INDEPENDENT_AMBULATORY_CARE_PROVIDER_SITE_OTHER): Payer: Medicare Other | Admitting: Family Medicine

## 2014-11-01 VITALS — BP 130/84 | HR 78 | Temp 98.6°F | Resp 18 | Ht 62.0 in | Wt 112.0 lb

## 2014-11-01 DIAGNOSIS — M353 Polymyalgia rheumatica: Secondary | ICD-10-CM

## 2014-11-01 MED ORDER — PREDNISONE 20 MG PO TABS: ORAL_TABLET | ORAL | Status: DC

## 2014-11-01 NOTE — Progress Notes (Signed)
Subjective:    Patient ID: Theresa Sloan, female    DOB: 07/28/1941, 73 y.o.   MRN: [id number]  HPI 10/01/14 Patient complains of 3 weeks of pain and pressure behind both eyes and behind both ears.  She has had mild rhinorrhea. She denies any cough or sore throat. She denies any fever. She denies any vision changes. She describes it as a dull constant pain behind her eyeballs and in the area of the frontal sinuses. She also complains of a deep ache within her ears. She is slightly tender to palpation over the right temporal artery. She also complains of 3 weeks of aching pain in both shoulders. It is exacerbated slightly by abduction of shoulder greater than 90 but it does ache constantly. She denies any injury. She denies any pain in her hips.  On examination today, a filling has fallen out of one of her right upper posterior molars. There is no evidence of abscess in this area but the area is tender to the patient. She is scheduled for surgery with her dentist in the next week.  At that time, my plan was:  Differential diagnosis includes rhinosinusitis coupled with bursitis in the shoulder, TMJ syndrome causing her ear pain and bursitis in the shoulder, polymyalgia rheumatica as an explanation of both symptoms. I will start the patient on amoxicillin 875 mg by mouth twice a day for 10 days to treat possible sinusitis. I will also start the patient on diclofenac 75 mg by mouth twice a day for bursitis in her shoulder. I would like to recheck the patient later this week on Friday. If her symptoms are improving no further workup is necessary. If the patient's symptoms are worsening, I may try her empirically on prednisone for possible polymyalgia rheumatica. Given her anxiety I'm also concerned about possible TMJ syndrome causing the pain in her ear although there is no palpable crepitus today on examination of the TMJ joint and her pain is not reproducible with chewing. Furthermore TMJ would not explain  the pressure behind her eyes or the pain in her shoulders.  10/05/14 Patient is here today for recheck.  The pain in both of her shoulders is essentially gone ever since she started taking the diclofenac making bursitis the most likely explanation. This also makes PMR very unlikely.  Unfortunately she continues to complain of pressure and pain around both ears particularly near the TMJ joint. Today on examination she does have some palpable crepitus in the joint and some tenderness around the joint which was not present when I last examined her. She continues to complain of tension and pain around both eyes although examination of her eyes are normal.  At that time, my plan was:  Most likely explanation for the patient's ear pain and pressure is TMJ. She is not improving on the amoxicillin and therefore I asked her to discontinue the amoxicillin. I recommended she take Valium 10 mg 30 minutes before bed every night. Also recommended that she wear a mouthguard every night. Continue the diclofenac for the next 2 weeks. Recheck in 2 weeks if no better or sooner if worse.  11/01/14 She is here today for follow up.  Patient's symptoms are much worse. She continues to have severe pain and pressure around her eyes and in both temples. She continues to complain of pain around her ears near the TMJ joint. She's been trying the mouthguard and the Valium without any relief. She denies any blurry vision but she does complain  of headaches. She is now also complaining of severe pain in both shoulders and in her neck. Her symptoms are starting to set more like temporal arteritis. Patient has quit taking diclofenac. She is also more confused. It seems her dementia is worsening. She is also having a worsening essential tremor. Past Medical History  Diagnosis Date  . Hypertension   . Hyperlipidemia   . Anxiety   . GERD (gastroesophageal reflux disease)   . TMJ (dislocation of temporomandibular joint)   . Memory  difficulty 08/07/2014  . 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 Retinal tear repair cryotherapy    . Cesarean section  1992   Current Outpatient Prescriptions on File Prior to Visit  Medication Sig Dispense Refill  . ALPRAZolam (XANAX) 0.5 MG tablet Take 1 tablet (0.5 mg total) by mouth every 8 (eight) hours. 30 tablet 0  . atorvastatin (LIPITOR) 80 MG tablet Take 1 tablet by mouth every evening.  3  . chlorthalidone (HYGROTON) 25 MG tablet TAKE 1 TABLET EVERY DAY 30 tablet 5  . diazepam (VALIUM) 10 MG tablet Take 1 tablet (10 mg total) by mouth at bedtime as needed for sleep (for tmj). 30 tablet 1  . diclofenac (VOLTAREN) 75 MG EC tablet Take 1 tablet (75 mg total) by mouth 2 (two) times daily. 30 tablet 0  . donepezil (ARICEPT) 10 MG tablet TAKE 1 TABLET (10 MG TOTAL) BY MOUTH AT BEDTIME. 30 tablet 5  . escitalopram (LEXAPRO) 10 MG tablet TAKE 1 TABLET (10 MG TOTAL) BY MOUTH DAILY. 30 tablet 5  . Multiple Vitamins-Minerals (PRESERVISION AREDS 2 PO) Take 2 capsules by mouth daily.    Marland Kitchen omeprazole (PRILOSEC) 20 MG capsule TAKE ONE CAPSULE EVERY DAY 90 capsule 3  . valACYclovir (VALTREX) 1000 MG tablet Take 500 mg by mouth 2 (two) times daily.     No current facility-administered medications on file prior to visit.   No Known Allergies History   Social History  . Marital Status: Married    Spouse Name: N/A    Number of Children: 2  . Years of Education: 12 th   Occupational History  . retired    Social History Main Topics  . Smoking status: Never Smoker   . Smokeless tobacco: Never Used  . Alcohol Use: No  . Drug Use: No  . Sexual Activity: Not on file   Other Topics Concern  . Not on file   Social History Narrative      Review of Systems  All other systems reviewed and are negative.      Objective:   Physical Exam  Constitutional: She appears well-developed and well-nourished. No distress.    HENT:  Right Ear: External ear normal.  Left Ear: External ear normal.  Nose: Nose normal.  Mouth/Throat: Oropharynx is clear and moist. No oropharyngeal exudate.  Eyes: Conjunctivae and EOM are normal. Pupils are equal, round, and reactive to light. No scleral icterus.  Neck: Normal range of motion. Neck supple. No JVD present.  Cardiovascular: Normal rate, regular rhythm and normal heart sounds.   No murmur heard. Pulmonary/Chest: Effort normal and breath sounds normal. No respiratory distress. She has no wheezes. She has no rales.  Musculoskeletal:       Right shoulder: She exhibits normal range of motion and no tenderness.       Left shoulder: She exhibits normal range of motion and no tenderness.  Lymphadenopathy:  She has no cervical adenopathy.  Skin: She is not diaphoretic.  Vitals reviewed.         Assessment & Plan:   PMR (polymyalgia rheumatica) - Plan: predniSONE (DELTASONE) 20 MG tablet, Sedimentation rate  I'm concerned that the patient's headache, ear pain, eye pain, neck pain, shoulder pain may all be related and possibly polymyalgia rheumatica. I'm going to start the patient on prednisone. I will also check a sedimentation rate. I will see the patient back on Monday to reassess. If the patient has experienced rapid improvement in her symptoms I will begin to wean her down on the prednisone. If she is having no improvement in her symptoms, given her headaches and worsening mental status, I would recommend consulting a neurologist.

## 2014-11-02 LAB — SEDIMENTATION RATE: SED RATE: 20 mm/h (ref 0–22)

## 2014-11-05 ENCOUNTER — Ambulatory Visit (INDEPENDENT_AMBULATORY_CARE_PROVIDER_SITE_OTHER): Payer: Medicare Other | Admitting: Family Medicine

## 2014-11-05 ENCOUNTER — Encounter: Payer: Self-pay | Admitting: Neurology

## 2014-11-05 ENCOUNTER — Ambulatory Visit (INDEPENDENT_AMBULATORY_CARE_PROVIDER_SITE_OTHER): Payer: Medicare Other | Admitting: Neurology

## 2014-11-05 ENCOUNTER — Other Ambulatory Visit: Payer: Self-pay | Admitting: Family Medicine

## 2014-11-05 ENCOUNTER — Encounter: Payer: Self-pay | Admitting: Family Medicine

## 2014-11-05 VITALS — BP 110/70 | HR 60 | Temp 97.6°F | Resp 16 | Ht 62.0 in | Wt 114.0 lb

## 2014-11-05 VITALS — BP 125/66 | HR 63

## 2014-11-05 DIAGNOSIS — R251 Tremor, unspecified: Secondary | ICD-10-CM

## 2014-11-05 DIAGNOSIS — M26609 Unspecified temporomandibular joint disorder, unspecified side: Secondary | ICD-10-CM

## 2014-11-05 DIAGNOSIS — M2660 Temporomandibular joint disorder, unspecified: Secondary | ICD-10-CM | POA: Diagnosis not present

## 2014-11-05 DIAGNOSIS — R413 Other amnesia: Secondary | ICD-10-CM

## 2014-11-05 DIAGNOSIS — G250 Essential tremor: Secondary | ICD-10-CM

## 2014-11-05 DIAGNOSIS — G252 Other specified forms of tremor: Secondary | ICD-10-CM

## 2014-11-05 DIAGNOSIS — F0390 Unspecified dementia without behavioral disturbance: Secondary | ICD-10-CM | POA: Diagnosis not present

## 2014-11-05 MED ORDER — PROPRANOLOL HCL 10 MG PO TABS: 10.0000 mg | ORAL_TABLET | Freq: Two times a day (BID) | ORAL | Status: DC

## 2014-11-05 MED ORDER — MEMANTINE HCL ER 28 MG PO CP24: 28.0000 mg | ORAL_CAPSULE | Freq: Every day | ORAL | Status: DC

## 2014-11-05 NOTE — Progress Notes (Signed)
Subjective:    Patient ID: Theresa Sloan, female    DOB: 07/28/1941, 73 y.o.   MRN: [id number]  HPI 10/01/14 Patient complains of 3 weeks of pain and pressure behind both eyes and behind both ears.  She has had mild rhinorrhea. She denies any cough or sore throat. She denies any fever. She denies any vision changes. She describes it as a dull constant pain behind her eyeballs and in the area of the frontal sinuses. She also complains of a deep ache within her ears. She is slightly tender to palpation over the right temporal artery. She also complains of 3 weeks of aching pain in both shoulders. It is exacerbated slightly by abduction of shoulder greater than 90 but it does ache constantly. She denies any injury. She denies any pain in her hips.  On examination today, a filling has fallen out of one of her right upper posterior molars. There is no evidence of abscess in this area but the area is tender to the patient. She is scheduled for surgery with her dentist in the next week.  At that time, my plan was:  Differential diagnosis includes rhinosinusitis coupled with bursitis in the shoulder, TMJ syndrome causing her ear pain and bursitis in the shoulder, polymyalgia rheumatica as an explanation of both symptoms. I will start the patient on amoxicillin 875 mg by mouth twice a day for 10 days to treat possible sinusitis. I will also start the patient on diclofenac 75 mg by mouth twice a day for bursitis in her shoulder. I would like to recheck the patient later this week on Friday. If her symptoms are improving no further workup is necessary. If the patient's symptoms are worsening, I may try her empirically on prednisone for possible polymyalgia rheumatica. Given her anxiety I'm also concerned about possible TMJ syndrome causing the pain in her ear although there is no palpable crepitus today on examination of the TMJ joint and her pain is not reproducible with chewing. Furthermore TMJ would not explain  the pressure behind her eyes or the pain in her shoulders.  10/05/14 Patient is here today for recheck.  The pain in both of her shoulders is essentially gone ever since she started taking the diclofenac making bursitis the most likely explanation. This also makes PMR very unlikely.  Unfortunately she continues to complain of pressure and pain around both ears particularly near the TMJ joint. Today on examination she does have some palpable crepitus in the joint and some tenderness around the joint which was not present when I last examined her. She continues to complain of tension and pain around both eyes although examination of her eyes are normal.  At that time, my plan was:  Most likely explanation for the patient's ear pain and pressure is TMJ. She is not improving on the amoxicillin and therefore I asked her to discontinue the amoxicillin. I recommended she take Valium 10 mg 30 minutes before bed every night. Also recommended that she wear a mouthguard every night. Continue the diclofenac for the next 2 weeks. Recheck in 2 weeks if no better or sooner if worse.  11/01/14 She is here today for follow up.  Patient's symptoms are much worse. She continues to have severe pain and pressure around her eyes and in both temples. She continues to complain of pain around her ears near the TMJ joint. She's been trying the mouthguard and the Valium without any relief. She denies any blurry vision but she does complain  of headaches. She is now also complaining of severe pain in both shoulders and in her neck. Her symptoms are starting to sound more like temporal arteritis. Patient has quit taking diclofenac. She is also more confused. It seems her dementia is worsening. She is also having a worsening essential tremor.  At that time, my plan was: I'm concerned that the patient's headache, ear pain, eye pain, neck pain, shoulder pain may all be related and possibly polymyalgia rheumatica. I'm going to start the  patient on prednisone. I will also check a sedimentation rate. I will see the patient back on Monday to reassess. If the patient has experienced rapid improvement in her symptoms I will begin to wean her down on the prednisone. If she is having no improvement in her symptoms, given her headaches and worsening mental status, I would recommend consulting a neurologist.  She had an MRI in 2/15 and results are as follows:  "Mild asymmetry of the ventricles, which may be on congenital basis, overall within normal range for patient's age. No acute intracranial process. Mild to moderate white matter changes, as well juxta cortical/ cortical changes suggest chronic small vessel ischemic disease."    11/05/14 Patient is here today for follow-up.  ESR was in the 20's.  Given her memory issues and confusion at the last OV, I also asked her to stop the valium and only use the prednisone for possible PMR.  Patient saw no improvement in the pain around the TMJ joint. She continues to have pain and pressure in this area. She did notice improvement in her neck and shoulders on the prednisone. Improved approximately 30-40%. However her response was not consistent with PMR. She continues to have confusion and memory loss. She is on maximum dose Aricept. I spent approximately 25 minutes with the patient and her daughter discussing the history of her symptoms and the most likely causes. Past Medical History  Diagnosis Date  . Hypertension   . Hyperlipidemia   . Anxiety   . GERD (gastroesophageal reflux disease)   . TMJ (dislocation of temporomandibular joint)   . Memory difficulty 08/07/2014  . 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 Dementia     Past Surgical History  Procedure Laterality Date  . Retinal tear repair cryotherapy    . Cesarean section  1992   Current Outpatient Prescriptions on File Prior to Visit  Medication Sig Dispense Refill  . atorvastatin (LIPITOR) 80 MG tablet Take  1 tablet by mouth every evening.  3  . chlorthalidone (HYGROTON) 25 MG tablet TAKE 1 TABLET EVERY DAY 30 tablet 5  . diazepam (VALIUM) 10 MG tablet Take 1 tablet (10 mg total) by mouth at bedtime as needed for sleep (for tmj). 30 tablet 1  . donepezil (ARICEPT) 10 MG tablet TAKE 1 TABLET (10 MG TOTAL) BY MOUTH AT BEDTIME. 30 tablet 5  . escitalopram (LEXAPRO) 10 MG tablet TAKE 1 TABLET (10 MG TOTAL) BY MOUTH DAILY. 30 tablet 5  . Multiple Vitamins-Minerals (PRESERVISION AREDS 2 PO) Take 2 capsules by mouth daily.    Marland Kitchen omeprazole (PRILOSEC) 20 MG capsule TAKE ONE CAPSULE EVERY DAY 90 capsule 3  . valACYclovir (VALTREX) 1000 MG tablet Take 500 mg by mouth 2 (two) times daily.     No current facility-administered medications on file prior to visit.   No Known Allergies History   Social History  . Marital Status: Married    Spouse Name: N/A    Number of Children:  2  . Years of Education: 12 th   Occupational History  . retired    Social History Main Topics  . Smoking status: Never Smoker   . Smokeless tobacco: Never Used  . Alcohol Use: No  . Drug Use: No  . Sexual Activity: Not on file   Other Topics Concern  . Not on file   Social History Narrative      Review of Systems  All other systems reviewed and are negative.      Objective:   Physical Exam  Constitutional: She appears well-developed and well-nourished. No distress.  HENT:  Right Ear: External ear normal.  Left Ear: External ear normal.  Nose: Nose normal.  Mouth/Throat: Oropharynx is clear and moist. No oropharyngeal exudate.  Eyes: Conjunctivae and EOM are normal. Pupils are equal, round, and reactive to light. No scleral icterus.  Neck: Normal range of motion. Neck supple. No JVD present.  Cardiovascular: Normal rate, regular rhythm and normal heart sounds.   No murmur heard. Pulmonary/Chest: Effort normal and breath sounds normal. No respiratory distress. She has no wheezes. She has no rales.    Musculoskeletal:       Right shoulder: She exhibits normal range of motion and no tenderness.       Left shoulder: She exhibits normal range of motion and no tenderness.  Lymphadenopathy:    She has no cervical adenopathy.  Skin: She is not diaphoretic.  Vitals reviewed.         Assessment & Plan:   TMJ (temporomandibular joint disorder) - Plan: Ambulatory referral to Oral Maxillofacial Surgery  Dementia, without behavioral disturbance  I spent approximately 25 minute was with the patient and her daughter discussing her workup today, her symptoms, and the potential causes. I truly believe that her pain in her face and in her ears is due to TMJ disorder. I have tried and failed conservative strategies. Therefore I'm going to refer her to an oral maxillofacial surgeon for possible cortisone injections in the TMJ joint. I do not believe her symptoms are now due to polymyalgia rheumatica given the normal sedimentation rate and the lack of response to prednisone. I have asked the patient to discontinue prednisone. I believe the pain in her neck and shoulders is due to arthritis. It responded to prednisone but also responded to diclofenac. I will treat her symptoms with when necessary NSAIDs. I believe her memory problems are due to dementia. Continue Aricept 10 mg by mouth daily and start the patient on Namenda XR and gradually taper up to 28 mg a day.  Offered the patient a referral to a neurologist but they're comfortable with starting the [place].

## 2014-11-05 NOTE — Patient Instructions (Signed)

## 2014-11-05 NOTE — Progress Notes (Signed)
Reason for visit: Memory disturbance  Theresa Sloan is an 73 y.o. female  History of present illness:  Theresa Sloan is a 73 year old right-handed white female with an essential tremor involving the hands and the head and neck. The patient also has a memory disturbance. The patient has been on Aricept. Recently, her primary care physician has given her samples of Namenda to start. The patient is concerned about her tremor that affects the hands. The patient was on some diazepam, but this negatively impacted her balance and her cognitive status. She has come off the medication with some improvement. She lives alone, and she does operate a Teacher, music. The patient indicates that she does not have any issues with safety with driving. The patient does not sleep a long time at nighttime. She is somewhat anxious about living alone. She comes to this office for an evaluation.  Past Medical History  Diagnosis Date  . Hypertension   . Hyperlipidemia   . Anxiety   . GERD (gastroesophageal reflux disease)   . TMJ (dislocation of temporomandibular joint)   . Memory difficulty 08/07/2014  . 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 Dementia     Past Surgical History  Procedure Laterality Date  . Retinal tear repair cryotherapy    . Cesarean section  1992    Family History  Problem Relation Age of Onset  . Hypertension Sister   . Tremor Sister     Social history:  reports that she has never smoked. She has never used smokeless tobacco. She reports that she does not drink alcohol or use illicit drugs.   No Known Allergies  Medications:  Current Outpatient Prescriptions on File Prior to Visit  Medication Sig Dispense Refill  . atorvastatin (LIPITOR) 80 MG tablet Take 1 tablet by mouth every evening.  3  . chlorthalidone (HYGROTON) 25 MG tablet TAKE 1 TABLET EVERY DAY 30 tablet 5  . donepezil (ARICEPT) 10 MG tablet TAKE 1 TABLET (10 MG TOTAL) BY MOUTH AT BEDTIME. 30  tablet 5  . escitalopram (LEXAPRO) 10 MG tablet TAKE 1 TABLET (10 MG TOTAL) BY MOUTH DAILY. 30 tablet 5  . Multiple Vitamins-Minerals (PRESERVISION AREDS 2 PO) Take 2 capsules by mouth daily.    Marland Kitchen omeprazole (PRILOSEC) 20 MG capsule TAKE ONE CAPSULE EVERY DAY 90 capsule 3   No current facility-administered medications on file prior to visit.    ROS:  Out of a complete 14 system review of symptoms, the patient complains only of the following symptoms, and all other reviewed systems are negative.  Appetite change Ear pain Eye pain Frequency of urination Daytime sleepiness Joint pain, back pain, neck pain, neck stiffness Memory loss, dizziness, tremors Behavior problems, confusion, depression  Blood pressure 125/66, pulse 63.  Physical Exam  General: The patient is alert and cooperative at the time of the examination.  Skin: No significant peripheral edema is noted.   Neurologic Exam  Mental status: The Mini-Mental Status Examination done today shows a total score 27/30.  Cranial nerves: Facial symmetry is present. Speech is normal, no aphasia or dysarthria is noted. Extraocular movements are full. Visual fields are full. A side-to-side head and neck tremor is noted.  Motor: The patient has good strength in all 4 extremities.  Sensory examination: Soft touch sensation is symmetric on the face, arms, and legs.  Coordination: The patient has good finger-nose-finger and heel-to-shin bilaterally. An intention tremor seen with both upper extremities.  Gait and station:  The patient has a normal gait. Tandem gait is normal. Romberg is negative. No drift is seen.  Reflexes: Deep tendon reflexes are symmetric.   Assessment/Plan:  1. Memory disturbance  2. Benign essential tremor   The patient wishes to have treatment for the tremor. I will start very low-dose propranolol, starting at 10 mg twice daily. The patient will go on Namenda, she will remain on Aricept. She will  follow-up through this office in about 6 months. She will contact me if he is not doing well on the propranolol or if she needs a higher dose. The patient indicates that she weighs 113 pounds today, she has been able to maintain her weight on Aricept.  Jill Alexanders MD 11/05/2014 8:08 PM  [facility] [address] [place], [place] [address]  Phone [phone] Fax [phone]

## 2014-11-07 ENCOUNTER — Telehealth: Payer: Self-pay | Admitting: Neurology

## 2014-11-07 DIAGNOSIS — R269 Unspecified abnormalities of gait and mobility: Secondary | ICD-10-CM

## 2014-11-07 NOTE — Telephone Encounter (Signed)
Spoke to patient's daughter, Maudie Mercury, who relayed that the patient fell again.  She also relayed that she is confused and didn't recognize her the other night.  She is concerned that it could be from the Valium, but she stopped the Valium last Thursday.  Please call and advise.  She can be reached after 5pm at [phone].

## 2014-11-07 NOTE — Telephone Encounter (Signed)
I called the daughter, left a message, I will call back later. 

## 2014-11-07 NOTE — Telephone Encounter (Signed)
I called the patient, talk with the daughter. The patient has fallen again today. She has been off of Valium for 6 days. I'm not sure that this is still the cause of her walking problems. We will check MRI scan of the brain, get her into some physical therapy for gait training. The daughter asked that they call her at [phone] to schedule the testing.

## 2014-11-15 ENCOUNTER — Ambulatory Visit
Admission: RE | Admit: 2014-11-15 | Discharge: 2014-11-15 | Disposition: A | Discharge status: Home / Self Care | Payer: Medicare Other | Source: Ambulatory Visit | Attending: Neurology | Admitting: Neurology

## 2014-11-15 DIAGNOSIS — R269 Unspecified abnormalities of gait and mobility: Secondary | ICD-10-CM

## 2014-11-19 ENCOUNTER — Telehealth: Payer: Self-pay | Admitting: Neurology

## 2014-11-19 NOTE — Telephone Encounter (Signed)
I called the daughter, Maudie Mercury. 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 I will have her go on low dose aspriin.

## 2014-11-26 ENCOUNTER — Telehealth: Payer: Self-pay | Admitting: Family Medicine

## 2014-11-26 DIAGNOSIS — M5489 Other dorsalgia: Secondary | ICD-10-CM

## 2014-11-26 MED ORDER — MEMANTINE HCL ER 28 MG PO CP24: 28.0000 mg | ORAL_CAPSULE | Freq: Every day | ORAL | Status: DC

## 2014-11-26 NOTE — Telephone Encounter (Signed)
?   OK to order x-ray

## 2014-11-26 NOTE — Telephone Encounter (Signed)
RX sent to pharm and order placed for x-ray - tried to call Maudie Mercury but no answer and vm is full.

## 2014-11-26 NOTE — Telephone Encounter (Signed)
Pt would like to have rx called in of the trail sample of amenda  [facility]  [phone] (there is a long pause on kim cell phone if you need to leave VM) -pt had fall on christmas eve and pt refused to go to er and she is having back pain, can you order xray to [facility]

## 2014-11-26 NOTE — Telephone Encounter (Signed)
[facility] with xray and refill on namenda xr 28 mg poqday

## 2014-11-26 NOTE — Telephone Encounter (Signed)
Patient's daughter Maudie Mercury is calling because she has questions about patient's MRI. Please call after 5:00pm. Thank you.

## 2014-11-26 NOTE — Telephone Encounter (Signed)
I called the daughter. The MRI showed a number of the corpus callosum. The presumption is that this is related to small vessel disease. I will have her go on low-dose aspirin. A recent vitamin B12 level was done, this was unremarkable.

## 2014-11-28 ENCOUNTER — Ambulatory Visit
Admission: RE | Admit: 2014-11-28 | Discharge: 2014-11-28 | Disposition: A | Discharge status: Home / Self Care | Payer: Medicare Other | Source: Ambulatory Visit | Attending: Family Medicine | Admitting: Family Medicine

## 2014-11-28 DIAGNOSIS — M5489 Other dorsalgia: Secondary | ICD-10-CM

## 2014-11-28 DIAGNOSIS — M47817 Spondylosis without myelopathy or radiculopathy, lumbosacral region: Secondary | ICD-10-CM | POA: Diagnosis not present

## 2014-11-28 DIAGNOSIS — M5137 Other intervertebral disc degeneration, lumbosacral region: Secondary | ICD-10-CM | POA: Diagnosis not present

## 2014-11-28 NOTE — Telephone Encounter (Signed)
[name] aware via vm 

## 2015-01-17 ENCOUNTER — Other Ambulatory Visit: Payer: Self-pay | Admitting: Family Medicine

## 2015-01-17 NOTE — Telephone Encounter (Signed)
Refill appropriate and filled per protocol. 

## 2015-02-05 ENCOUNTER — Ambulatory Visit: Payer: Medicare Other | Admitting: Neurology

## 2015-03-05 ENCOUNTER — Ambulatory Visit (INDEPENDENT_AMBULATORY_CARE_PROVIDER_SITE_OTHER): Payer: Medicare Other | Admitting: Neurology

## 2015-03-05 ENCOUNTER — Encounter: Payer: Self-pay | Admitting: Neurology

## 2015-03-05 VITALS — BP 119/67 | HR 50 | Ht 63.0 in | Wt 109.6 lb

## 2015-03-05 DIAGNOSIS — R251 Tremor, unspecified: Secondary | ICD-10-CM

## 2015-03-05 DIAGNOSIS — R413 Other amnesia: Secondary | ICD-10-CM

## 2015-03-05 DIAGNOSIS — G250 Essential tremor: Secondary | ICD-10-CM

## 2015-03-05 DIAGNOSIS — R269 Unspecified abnormalities of gait and mobility: Secondary | ICD-10-CM | POA: Diagnosis not present

## 2015-03-05 DIAGNOSIS — G252 Other specified forms of tremor: Principal | ICD-10-CM

## 2015-03-05 HISTORY — DX: Unspecified abnormalities of gait and mobility: R26.9

## 2015-03-05 MED ORDER — PROPRANOLOL HCL 10 MG PO TABS: 10.0000 mg | ORAL_TABLET | Freq: Two times a day (BID) | ORAL | Status: DC

## 2015-03-05 NOTE — Progress Notes (Signed)
Reason for visit: Memory disorder  Theresa Sloan is an 74 y.o. female  History of present illness:  Theresa Sloan is a 74 year old right-handed white female with a history of an essential tremor, and a history of a memory disorder. The patient had a fall that occurred in December 2018. The patient underwent MRI evaluation of the brain following this, she was found to have an acute small centrum semiovale stroke event. The patient is on low-dose aspirin. She indicates that she has not had any further episodes of falling since that time. The patient continues to have ongoing memory issues, but she still operates a motor vehicle without difficulty. The patient manages all of her day-to-day activities without problems. The patient denies any focal numbness or weakness of the face, arms, or legs. She has not required a cane or walker for ambulation. The tremors currently do not impact her ability to function, she is satisfied with the degree of tremor.  Past Medical History  Diagnosis Date  . Hypertension   . Hyperlipidemia   . Anxiety   . GERD (gastroesophageal reflux disease)   . TMJ (dislocation of temporomandibular joint)   . Memory difficulty 08/07/2014  . 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 Dementia   . Gait difficulty 03/05/2015    Past Surgical History  Procedure Laterality Date  . Retinal tear repair cryotherapy    . Cesarean section  1992    Family History  Problem Relation Age of Onset  . Hypertension Sister   . Tremor Sister     Social history:  reports that she has never smoked. She has never used smokeless tobacco. She reports that she does not drink alcohol or use illicit drugs.   No Known Allergies  Medications:  Prior to Admission medications   Medication Sig Start Date End Date Taking? Authorizing Provider  aspirin 81 MG tablet Take 81 mg by mouth daily.   Yes Historical Provider, MD  atorvastatin (LIPITOR) 80 MG tablet Take 1 tablet by  mouth every evening. 09/10/14  Yes Historical Provider, MD  chlorthalidone (HYGROTON) 25 MG tablet TAKE 1 TABLET EVERY DAY 10/08/14  Yes [name], MD  donepezil (ARICEPT) 10 MG tablet TAKE 1 TABLET (10 MG TOTAL) BY MOUTH AT BEDTIME. 09/10/14  Yes [name], MD  escitalopram (LEXAPRO) 10 MG tablet TAKE 1 TABLET (10 MG TOTAL) BY MOUTH DAILY. 08/27/14  Yes [name], MD  Memantine HCl ER 28 MG CP24 Take 28 mg by mouth daily. 11/26/14  Yes [name], MD  Multiple Vitamins-Minerals (PRESERVISION AREDS 2 PO) Take 2 capsules by mouth daily.   Yes Historical Provider, MD  omeprazole (PRILOSEC) 20 MG capsule TAKE ONE CAPSULE EVERY DAY 06/13/14  Yes [name], MD  propranolol (INDERAL) 10 MG tablet Take 1 tablet (10 mg total) by mouth 2 (two) times daily. 11/05/14  Yes Kathrynn Ducking, MD    ROS:  Out of a complete 14 system review of symptoms, the patient complains only of the following symptoms, and all other reviewed systems are negative.  Ear pain, runny nose Dizziness, tremors  Blood pressure 119/67, pulse 50, height 5\' 3"  (1.6 m), weight 109 lb 9.6 oz (49.714 kg).  Physical Exam  General: The patient is alert and cooperative at the time of the examination.  Skin: No significant peripheral edema is noted.   Neurologic Exam  Mental status: The Mini-Mental Status Examination done today shows a total score  23/30.   Cranial nerves: Facial symmetry is present. Speech is normal, no aphasia or dysarthria is noted. Extraocular movements are full. Visual fields are full. The patient has a mild side to side head tremor.  Motor: The patient has good strength in all 4 extremities.  Sensory examination: Soft touch sensation is symmetric on the face, arms, and legs.  Coordination: The patient has good finger-nose-finger and heel-to-shin bilaterally. There is a mild intention tremor with finger-nose-finger bilaterally.  Gait and station: The patient has a normal  gait. Tandem gait is slightly unsteady. Romberg is negative. No drift is seen.  Reflexes: Deep tendon reflexes are symmetric.   MRI brain 11/16/14:  IMPRESSION:  Abnormal MRI brain (without) demonstrating: 1. New area (compared to MRI 01/19/14) of T2 hyperintensity noted in the splenium of corpus callosum. Elsewhere, there are stable mild scattered periventricular, subcortical, juxtacortical foci of non-specifici gliosis / T2 hyperintensities. These findings are non-specific and considerations include toxic/metabolic, nutritional deficiency, autoimmune, inflammatory, post-infectious or microvascular ischemic etiologies.  2. Mild perisylvian atrophy. Lateral, third and fourth ventricle are moderately enlarged (right occipital horn larger than left occipital horn). 3. No acute findings.    Assessment/Plan:  1. Essential tremor  2. Memory disorder  3. Cerebrovascular disease, recent stroke  4. Gait disorder  The patient is to continue the Aricept and Namenda. She will remain on aspirin for the cerebrovascular disease. The patient has done well with the tremor, she will remain on propranolol 10 mg twice daily, a prescription was called in for this medication. She will follow-up in 6 months or sooner if needed.  Jill Alexanders MD 03/05/2015 9:12 PM  [facility] [address] [place], [place] [address]  Phone [phone] Fax [phone]

## 2015-03-05 NOTE — Patient Instructions (Signed)

## 2015-03-12 DIAGNOSIS — H16143 Punctate keratitis, bilateral: Secondary | ICD-10-CM | POA: Diagnosis not present

## 2015-03-25 DIAGNOSIS — H04123 Dry eye syndrome of bilateral lacrimal glands: Secondary | ICD-10-CM | POA: Diagnosis not present

## 2015-03-26 ENCOUNTER — Other Ambulatory Visit: Payer: Self-pay | Admitting: Family Medicine

## 2015-03-26 NOTE — Telephone Encounter (Signed)
Medication refilled per protocol. 

## 2015-03-28 ENCOUNTER — Telehealth: Payer: Self-pay | Admitting: Family Medicine

## 2015-03-28 NOTE — Telephone Encounter (Signed)
Patient's daughter kim calling to see if Theresa Sloan still needs to be taking the chlorthalidone  Please call her at [phone]

## 2015-03-29 NOTE — Telephone Encounter (Signed)
I see no record of [place] stopping it so yes.

## 2015-04-02 NOTE — Telephone Encounter (Signed)
Pt's daughter aware via vm 

## 2015-04-12 ENCOUNTER — Other Ambulatory Visit: Payer: Self-pay | Admitting: Family Medicine

## 2015-04-12 NOTE — Telephone Encounter (Signed)
Medication refilled per protocol. 

## 2015-04-12 NOTE — Telephone Encounter (Signed)
Medication filled x1 with no refills.   Requires office visit before any further refills can be given.   Letter sent.  

## 2015-05-02 ENCOUNTER — Telehealth: Payer: Self-pay | Admitting: *Deleted

## 2015-05-02 NOTE — Telephone Encounter (Signed)
Received a call from Wonda Cheng pt's daughter wanting to let us know that she will not be at pt's appt but her son Dellis Filbert will be with her and wanted to give you some issues that needs to be discussed:  1). There is a place on her forehead that is broke out, has been putting OTC ointment on it x2 weeks and not getting better  2). Pt has frequent urination has to wear depends and was questioning is her meds are causing this problem  3). Pt is still c/o being dizzy and has gait abnormality since last visit and wants to see if provider can see what is going on.  4). Wants Dr. Dennard Schaumann opinion on her memory since her last visit if she gotten worse or seems better  Daughter wants someone to call her after the office visit to let her know about the visit.  Merry Proud will either be in waiting room or in his vehicle during pt's visit.(FYI)

## 2015-05-03 ENCOUNTER — Ambulatory Visit (INDEPENDENT_AMBULATORY_CARE_PROVIDER_SITE_OTHER): Payer: Medicare Other | Admitting: Family Medicine

## 2015-05-03 ENCOUNTER — Encounter: Payer: Self-pay | Admitting: Family Medicine

## 2015-05-03 VITALS — BP 110/76 | HR 60 | Temp 97.4°F | Resp 16 | Wt 112.0 lb

## 2015-05-03 DIAGNOSIS — E785 Hyperlipidemia, unspecified: Secondary | ICD-10-CM | POA: Diagnosis not present

## 2015-05-03 DIAGNOSIS — L309 Dermatitis, unspecified: Secondary | ICD-10-CM

## 2015-05-03 DIAGNOSIS — Z23 Encounter for immunization: Secondary | ICD-10-CM | POA: Diagnosis not present

## 2015-05-03 DIAGNOSIS — Z0000 Encounter for general adult medical examination without abnormal findings: Secondary | ICD-10-CM

## 2015-05-03 DIAGNOSIS — Z1231 Encounter for screening mammogram for malignant neoplasm of breast: Secondary | ICD-10-CM

## 2015-05-03 LAB — CBC WITH DIFFERENTIAL/PLATELET
BASOS PCT: 0 % (ref 0–1)
Basophils Absolute: 0 10*3/uL (ref 0.0–0.1)
EOS PCT: 1 % (ref 0–5)
Eosinophils Absolute: 0.1 10*3/uL (ref 0.0–0.7)
HEMATOCRIT: 40.8 % (ref 36.0–46.0)
Hemoglobin: 13.6 g/dL (ref 12.0–15.0)
LYMPHS PCT: 22 % (ref 12–46)
Lymphs Abs: 2.2 10*3/uL (ref 0.7–4.0)
MCH: 32.9 pg (ref 26.0–34.0)
MCHC: 33.3 g/dL (ref 30.0–36.0)
MCV: 98.6 fL (ref 78.0–100.0)
MPV: 10.6 fL (ref 8.6–12.4)
Monocytes Absolute: 0.5 10*3/uL (ref 0.1–1.0)
Monocytes Relative: 5 % (ref 3–12)
NEUTROS PCT: 72 % (ref 43–77)
Neutro Abs: 7.1 10*3/uL (ref 1.7–7.7)
Platelets: 324 10*3/uL (ref 150–400)
RBC: 4.14 MIL/uL (ref 3.87–5.11)
RDW: 13.9 % (ref 11.5–15.5)
WBC: 9.8 10*3/uL (ref 4.0–10.5)

## 2015-05-03 LAB — COMPLETE METABOLIC PANEL WITH GFR
ALT: 22 U/L (ref 0–35)
AST: 25 U/L (ref 0–37)
Albumin: 3.9 g/dL (ref 3.5–5.2)
Alkaline Phosphatase: 80 U/L (ref 39–117)
BILIRUBIN TOTAL: 0.7 mg/dL (ref 0.2–1.2)
BUN: 15 mg/dL (ref 6–23)
CO2: 31 meq/L (ref 19–32)
Calcium: 9.2 mg/dL (ref 8.4–10.5)
Chloride: 99 mEq/L (ref 96–112)
Creat: 0.77 mg/dL (ref 0.50–1.10)
GFR, EST NON AFRICAN AMERICAN: 77 mL/min
GFR, Est African American: 89 mL/min
Glucose, Bld: 98 mg/dL (ref 70–99)
Potassium: 3.6 mEq/L (ref 3.5–5.3)
Sodium: 140 mEq/L (ref 135–145)
Total Protein: 6.7 g/dL (ref 6.0–8.3)

## 2015-05-03 LAB — LIPID PANEL
CHOL/HDL RATIO: 2.5 ratio
Cholesterol: 206 mg/dL — ABNORMAL HIGH (ref 0–200)
HDL: 82 mg/dL (ref >=46)
LDL Cholesterol: 98 mg/dL (ref 0–99)
TRIGLYCERIDES: 129 mg/dL (ref <150)
VLDL: 26 mg/dL (ref 0–40)

## 2015-05-03 MED ORDER — TRIAMCINOLONE ACETONIDE 0.1 % EX CREA: 1.0000 "application " | TOPICAL_CREAM | Freq: Two times a day (BID) | CUTANEOUS | Status: DC

## 2015-05-03 NOTE — Progress Notes (Signed)
Subjective:    Patient ID: Theresa Sloan, female    DOB: 30-Apr-1941, 74 y.o.   MRN: [id number]  HPI Patient is here today for complete physical exam. She has moderate dementia. I performed a Mini-Mental status exam today. The patient accidentally extremely well on date, time, and location. Infectious she did better than she has done in the past. However she is unable to perform serial sevens. She was able to spell world in reverse. She was unable to remember 3 objects on recall. In total she performed a 26 on 30. This is actually stable or slightly better than in the past and therefore I feel comforted by the fact that the Namenda and Aricept seem to be helping this patient. She brings a list of questions today from her daughter from home. Her daughter was unable to come to the appointment. Her daughter is concerned by a rash on her left forehead. There are 3 sores in a linear grouping on her upper left forehead that have been there for 2 weeks. Patient states that they itched severely when they first started but has slowly started to improve as they have dried up. To me it looks like it may be a mild case of shingles. Her daughter is also concerned about dizziness and unstable gait that the patient has been having. Patient states that when she stands up rapidly she feels lightheaded. However once she has been standing for a while, she does not have as much dizziness. Her blood pressures actually low today in the office. She is on propranolol for tremor and she is on chlorthalidone for blood pressure. These 2 medications may be contributing to her disequilibrium. She also reports polyuria. She denies any dysuria or hematuria. She occasionally has urge incontinence. This may be getting exacerbated by chlorthalidone or may be a sign of overactive bladder. Past Medical History  Diagnosis Date  . Hypertension   . Hyperlipidemia   . Anxiety   . GERD (gastroesophageal reflux disease)   . TMJ (dislocation  of temporomandibular joint)   . Memory difficulty 08/07/2014  . 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 Dementia   . Gait difficulty 03/05/2015   Past Surgical History  Procedure Laterality Date  . Retinal tear repair cryotherapy    . Cesarean section  1992   Current Outpatient Prescriptions on File Prior to Visit  Medication Sig Dispense Refill  . aspirin 81 MG tablet Take 81 mg by mouth daily.    Marland Kitchen atorvastatin (LIPITOR) 80 MG tablet TAKE 1 TABLET BY MOUTH AT BEDTIME 30 tablet 0  . chlorthalidone (HYGROTON) 25 MG tablet TAKE 1 TABLET EVERY DAY 30 tablet 5  . donepezil (ARICEPT) 10 MG tablet TAKE 1 TABLET (10 MG TOTAL) BY MOUTH AT BEDTIME. 30 tablet 5  . escitalopram (LEXAPRO) 10 MG tablet TAKE 1 TABLET (10 MG TOTAL) BY MOUTH DAILY. 30 tablet 5  . Memantine HCl ER 28 MG CP24 Take 28 mg by mouth daily. 30 capsule 11  . Multiple Vitamins-Minerals (PRESERVISION AREDS 2 PO) Take 2 capsules by mouth daily.    Marland Kitchen omeprazole (PRILOSEC) 20 MG capsule TAKE ONE CAPSULE EVERY DAY 90 capsule 3  . propranolol (INDERAL) 10 MG tablet Take 1 tablet (10 mg total) by mouth 2 (two) times daily. 60 tablet 5   No current facility-administered medications on file prior to visit.   No Known Allergies History   Social History  . Marital Status: Married    Spouse Name:  N/A  . Number of Children: 2  . Years of Education: 12 th   Occupational History  . retired    Social History Main Topics  . Smoking status: Never Smoker   . Smokeless tobacco: Never Used  . Alcohol Use: No  . Drug Use: No  . Sexual Activity: Not on file   Other Topics Concern  . Not on file   Social History Narrative   Patient is right handed.   Patient does not drink caffeine.   Family History  Problem Relation Age of Onset  . Hypertension Sister   . Tremor Sister       Review of Systems  All other systems reviewed and are negative.      Objective:   Physical Exam  Constitutional: She is  oriented to person, place, and time. She appears well-developed and well-nourished. No distress.  HENT:  Head: Normocephalic and atraumatic.  Right Ear: External ear normal.  Left Ear: External ear normal.  Nose: Nose normal.  Mouth/Throat: Oropharynx is clear and moist. No oropharyngeal exudate.  Eyes: Conjunctivae and EOM are normal. Pupils are equal, round, and reactive to light. Right eye exhibits no discharge. Left eye exhibits no discharge. No scleral icterus.  Neck: Neck supple. No JVD present. No tracheal deviation present. No thyromegaly present.  Cardiovascular: Normal rate, regular rhythm, normal heart sounds and intact distal pulses.  Exam reveals no gallop and no friction rub.   No murmur heard. Pulmonary/Chest: Effort normal and breath sounds normal. No stridor. No respiratory distress. She has no wheezes. She has no rales.  Abdominal: Soft. Bowel sounds are normal. She exhibits no distension and no mass. There is no tenderness. There is no rebound and no guarding.  Musculoskeletal: Normal range of motion. She exhibits no edema.  Lymphadenopathy:    She has no cervical adenopathy.  Neurological: She is alert and oriented to person, place, and time. She has normal reflexes. She displays normal reflexes. No cranial nerve deficit. She exhibits normal muscle tone. Coordination normal.  Skin: Skin is warm. No rash noted. She is not diaphoretic. No erythema. No pallor.  Psychiatric: She has a normal mood and affect. Her speech is normal and behavior is normal. Judgment and thought content normal. Cognition and memory are impaired. She exhibits abnormal recent memory.  Vitals reviewed.         Assessment & Plan:  Dermatitis - Plan: triamcinolone cream (KENALOG) 0.1 %  HLD (hyperlipidemia) - Plan: CBC with Differential/Platelet, COMPLETE METABOLIC PANEL WITH GFR, Lipid panel  Need for vaccination - Plan: Pneumococcal polysaccharide vaccine 23-valent greater than or equal to 2yo  subcutaneous/IM  Routine general medical examination at a health care facility - Plan: DG Bone Density  Screening mammogram for high-risk patient - Plan: MM Digital Screening  Patient received Pneumovax 23 today in clinic. I have scheduled her for a mammogram as well as a bone density test. I will also check a CBC, CMP, fasting lipid panel. Goal LDL cholesterol is less than 70 given her history of cerebrovascular disease. Continue aspirin 81 mg by mouth daily. I believe that her dementia is stable on Aricept and Namenda and on May no changes in his medications at the present time. I have asked the family to discontinue chlorthalidone to see if this helps with polyuria and maybe even helps with some imbalance that could be related to drops in her blood pressure. I would hesitate to place this patient on anything for overactive bladder giving her dementia.  I will treat the rash on her forever triamcinolone cream. If the rash is not completely improved in 1 week I would like her to return for biopsy.

## 2015-05-08 ENCOUNTER — Other Ambulatory Visit: Payer: Self-pay | Admitting: Family Medicine

## 2015-05-08 ENCOUNTER — Telehealth: Payer: Self-pay | Admitting: *Deleted

## 2015-05-08 DIAGNOSIS — E2839 Other primary ovarian failure: Secondary | ICD-10-CM

## 2015-05-08 NOTE — Telephone Encounter (Signed)
[facility], left message on daughter Delfin Edis to return my call

## 2015-05-14 ENCOUNTER — Other Ambulatory Visit: Payer: Self-pay | Admitting: Family Medicine

## 2015-05-22 ENCOUNTER — Other Ambulatory Visit: Payer: Medicare Other

## 2015-05-22 ENCOUNTER — Ambulatory Visit: Payer: Medicare Other

## 2015-05-26 ENCOUNTER — Other Ambulatory Visit: Payer: Self-pay | Admitting: Family Medicine

## 2015-05-27 NOTE — Telephone Encounter (Signed)
Refill appropriate and filled per protocol. 

## 2015-06-05 ENCOUNTER — Telehealth: Payer: Self-pay | Admitting: Family Medicine

## 2015-06-05 NOTE — Telephone Encounter (Signed)
I called the pharmacy.  Apparently Namenda XR does not have a generic component.  Only the plain Namenda.  Pharmacist also discussed this with daughter.  Family wants to switch to formulary generic Namenda BID.  OK?  What strength for the plain?

## 2015-06-05 NOTE — Telephone Encounter (Signed)
Pharmacy is asking for generic Namenda, insurance will no longer cover Namenda XR.  Need to call pharmacy, looks like pt already on generic???

## 2015-06-06 MED ORDER — MEMANTINE HCL 10 MG PO TABS
10.0000 mg | ORAL_TABLET | Freq: Two times a day (BID) | ORAL | Status: DC
Start: 2015-06-06 — End: 2015-06-21

## 2015-06-06 NOTE — Telephone Encounter (Signed)
namenda 10 mg pobid

## 2015-06-06 NOTE — Telephone Encounter (Signed)
New Rx to pharmacy, med list updated

## 2015-06-17 ENCOUNTER — Telehealth: Payer: Self-pay | Admitting: Family Medicine

## 2015-06-17 NOTE — Telephone Encounter (Signed)
Needs to go to er

## 2015-06-17 NOTE — Telephone Encounter (Signed)
Daughter reports mother has been C/O of heart fluttering and had syncopal episode over the weekend.  Says it must be one of her medications!  Refuses to go to ED.  States can not come to office today.  Appt given for tomorrow.

## 2015-06-17 NOTE — Telephone Encounter (Signed)
Called daughter back left message with provider recommendations.

## 2015-06-18 ENCOUNTER — Ambulatory Visit: Payer: Self-pay | Admitting: Family Medicine

## 2015-06-21 ENCOUNTER — Encounter: Payer: Self-pay | Admitting: Family Medicine

## 2015-06-21 ENCOUNTER — Ambulatory Visit (INDEPENDENT_AMBULATORY_CARE_PROVIDER_SITE_OTHER): Payer: Medicare Other | Admitting: Family Medicine

## 2015-06-21 VITALS — BP 130/70 | HR 64 | Temp 98.3°F | Resp 14 | Ht 62.0 in | Wt 120.0 lb

## 2015-06-21 DIAGNOSIS — R55 Syncope and collapse: Secondary | ICD-10-CM | POA: Diagnosis not present

## 2015-06-21 NOTE — Progress Notes (Signed)
Subjective:    Patient ID: Theresa Sloan, female    DOB: 04-14-1941, 74 y.o.   MRN: [id number]  HPI History is very difficult to obtain. Patient is a 74 year old Caucasian female with a history of dementia. Therefore she cannot provide an accurate history. She is accompanied by her grandson who states that the patient was riding in a car with him.  When she stood to get out of the car, she commented that she didn't feel well. She held onto the car for a few seconds and then lost consciousness and collapsed to the ground. By the time he ran to the other side of the car to check on her, she did regain consciousness. However she has no recollection of the event. She states today that she feels fine and she denies any chest pain shortness of breath or dyspnea on exertion. She denies any palpitations although I am unsure how much we can trust this given her dementia. Past Medical History  Diagnosis Date  . Hypertension   . Hyperlipidemia   . Anxiety   . GERD (gastroesophageal reflux disease)   . TMJ (dislocation of temporomandibular joint)   . Memory difficulty 08/07/2014  . 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 Dementia   . Gait difficulty 03/05/2015   Past Surgical History  Procedure Laterality Date  . Retinal tear repair cryotherapy    . Cesarean section  1992   Current Outpatient Prescriptions on File Prior to Visit  Medication Sig Dispense Refill  . aspirin 81 MG tablet Take 81 mg by mouth daily.    Marland Kitchen atorvastatin (LIPITOR) 80 MG tablet TAKE 1 TABLET BY MOUTH AT BEDTIME 30 tablet 3  . chlorthalidone (HYGROTON) 25 MG tablet TAKE 1 TABLET EVERY DAY 30 tablet 5  . escitalopram (LEXAPRO) 10 MG tablet TAKE 1 TABLET (10 MG TOTAL) BY MOUTH DAILY. 30 tablet 5  . Multiple Vitamins-Minerals (PRESERVISION AREDS 2 PO) Take 2 capsules by mouth daily.    Marland Kitchen omeprazole (PRILOSEC) 20 MG capsule TAKE ONE CAPSULE EVERY DAY 90 capsule 3  . propranolol (INDERAL) 10 MG tablet Take 1  tablet (10 mg total) by mouth 2 (two) times daily. 60 tablet 5  . triamcinolone cream (KENALOG) 0.1 % Apply 1 application topically 2 (two) times daily. 30 g 0   No current facility-administered medications on file prior to visit.   No Known Allergies History   Social History  . Marital Status: Married    Spouse Name: N/A  . Number of Children: 2  . Years of Education: 12 th   Occupational History  . retired    Social History Main Topics  . Smoking status: Never Smoker   . Smokeless tobacco: Never Used  . Alcohol Use: No  . Drug Use: No  . Sexual Activity: Not on file   Other Topics Concern  . Not on file   Social History Narrative   Patient is right handed.   Patient does not drink caffeine.      Review of Systems  All other systems reviewed and are negative.      Objective:   Physical Exam  Constitutional: She appears well-developed and well-nourished.  Cardiovascular: Normal rate, regular rhythm, normal heart sounds and intact distal pulses.   No murmur heard. Pulmonary/Chest: Effort normal and breath sounds normal. No respiratory distress. She has no wheezes. She has no rales.  Abdominal: Soft. Bowel sounds are normal. She exhibits no distension. There is no tenderness. There  is no rebound and no guarding.  Musculoskeletal: She exhibits no edema.  Vitals reviewed.         Assessment & Plan:  Syncope and collapse - Plan: Ambulatory referral to Cardiology  given the patient's age, I believe she is a cardiology referral for an echocardiogram of the heart, an event monitor to evaluate for any cardiac arrhythmias that could cause syncope. I will expedite this referral as soon as possible. I will also check a basic lab work to ensure that the patient is not anemic or dehydrated.  Given the orthostatic nature of the event I'm concerned it may have been due to hypotension possibly from dehydration. Therefore I recommended that she stop chlorthalidone temporarily  until we rule out cardiac arrhythmias.  EKG today reveals mild sinus bradycardia with a left bundle branch block. There is no significant change from her previous EKGs in the chart. We will proceed with cardiology evaluation.

## 2015-06-22 LAB — CBC WITH DIFFERENTIAL/PLATELET
Basophils Absolute: 0.1 10*3/uL (ref 0.0–0.1)
Basophils Relative: 1 % (ref 0–1)
Eosinophils Absolute: 0.3 10*3/uL (ref 0.0–0.7)
Eosinophils Relative: 3 % (ref 0–5)
HCT: 35 % — ABNORMAL LOW (ref 36.0–46.0)
Hemoglobin: 11.8 g/dL — ABNORMAL LOW (ref 12.0–15.0)
Lymphocytes Relative: 34 % (ref 12–46)
Lymphs Abs: 2.9 10*3/uL (ref 0.7–4.0)
MCH: 33.1 pg (ref 26.0–34.0)
MCHC: 33.7 g/dL (ref 30.0–36.0)
MCV: 98.3 fL (ref 78.0–100.0)
MONOS PCT: 8 % (ref 3–12)
MPV: 10.6 fL (ref 8.6–12.4)
Monocytes Absolute: 0.7 10*3/uL (ref 0.1–1.0)
Neutro Abs: 4.5 10*3/uL (ref 1.7–7.7)
Neutrophils Relative %: 54 % (ref 43–77)
Platelets: 251 10*3/uL (ref 150–400)
RBC: 3.56 MIL/uL — ABNORMAL LOW (ref 3.87–5.11)
RDW: 13.4 % (ref 11.5–15.5)
WBC: 8.4 10*3/uL (ref 4.0–10.5)

## 2015-06-22 LAB — COMPLETE METABOLIC PANEL WITH GFR
ALBUMIN: 3.6 g/dL (ref 3.5–5.2)
ALK PHOS: 76 U/L (ref 39–117)
ALT: 15 U/L (ref 0–35)
AST: 23 U/L (ref 0–37)
BUN: 12 mg/dL (ref 6–23)
CALCIUM: 8.7 mg/dL (ref 8.4–10.5)
CHLORIDE: 105 meq/L (ref 96–112)
CO2: 29 meq/L (ref 19–32)
Creat: 0.79 mg/dL (ref 0.50–1.10)
GFR, EST NON AFRICAN AMERICAN: 74 mL/min
GFR, Est African American: 86 mL/min
GLUCOSE: 72 mg/dL (ref 70–99)
POTASSIUM: 3.5 meq/L (ref 3.5–5.3)
Sodium: 143 mEq/L (ref 135–145)
Total Bilirubin: 0.4 mg/dL (ref 0.2–1.2)
Total Protein: 6 g/dL (ref 6.0–8.3)

## 2015-06-24 ENCOUNTER — Inpatient Hospital Stay: Admission: RE | Admit: 2015-06-24 | Payer: Medicare Other | Source: Ambulatory Visit

## 2015-06-24 ENCOUNTER — Encounter: Payer: Self-pay | Admitting: *Deleted

## 2015-06-24 ENCOUNTER — Ambulatory Visit: Payer: Medicare Other

## 2015-07-02 ENCOUNTER — Telehealth: Payer: Self-pay | Admitting: Family Medicine

## 2015-07-02 MED ORDER — VALACYCLOVIR HCL 1 G PO TABS: 1000.0000 mg | ORAL_TABLET | Freq: Three times a day (TID) | ORAL | Status: DC

## 2015-07-02 NOTE — Telephone Encounter (Signed)
Pt has broken out again with Shingles on back.  Very painful.  Can you call something for her to help dry it up.  Using Tylenol for pain and that is working well.  Just wants to dry up blisters.

## 2015-07-02 NOTE — Telephone Encounter (Signed)
Valtrex 1 g potid for 7 days

## 2015-07-02 NOTE — Telephone Encounter (Signed)
RX to pharmacy and left daughter voice message

## 2015-07-11 ENCOUNTER — Telehealth: Payer: Self-pay | Admitting: Family Medicine

## 2015-07-11 NOTE — Telephone Encounter (Signed)
Daughter called requesting last office note and EKG to be sent to Dr. Einar Gip Cardiologist with attn: Rise Paganini patient has an appt in the morning. I have faxed via EPIC.

## 2015-07-12 DIAGNOSIS — R001 Bradycardia, unspecified: Secondary | ICD-10-CM | POA: Diagnosis not present

## 2015-07-12 DIAGNOSIS — R55 Syncope and collapse: Secondary | ICD-10-CM | POA: Diagnosis not present

## 2015-07-12 DIAGNOSIS — I10 Essential (primary) hypertension: Secondary | ICD-10-CM | POA: Diagnosis not present

## 2015-07-19 ENCOUNTER — Telehealth: Payer: Self-pay | Admitting: Family Medicine

## 2015-07-19 ENCOUNTER — Other Ambulatory Visit: Payer: Self-pay | Admitting: Family Medicine

## 2015-07-19 NOTE — Telephone Encounter (Signed)
Patients daughter kim turner calling regarding one of patients doctors took her off of the medication for her tremors would like you to call her back  At [phone]

## 2015-07-19 NOTE — Telephone Encounter (Signed)
Refill appropriate and filled per protocol. 

## 2015-07-19 NOTE — Telephone Encounter (Signed)
Pt called to inform us that Dr. Einar Gip took pt off propranolol as her heart rate in office was 40. She wanted to know what else she could take for the tremors? I informed her that Dr. Dennard Schaumann did not put her on this medication that Dr. Jannifer Franklin did and that is who she should call. She agreed and will put a call into Dr. Tobey Grim office.

## 2015-07-25 DIAGNOSIS — H01005 Unspecified blepharitis left lower eyelid: Secondary | ICD-10-CM | POA: Diagnosis not present

## 2015-07-25 DIAGNOSIS — H01004 Unspecified blepharitis left upper eyelid: Secondary | ICD-10-CM | POA: Diagnosis not present

## 2015-07-25 DIAGNOSIS — Z961 Presence of intraocular lens: Secondary | ICD-10-CM | POA: Diagnosis not present

## 2015-07-25 DIAGNOSIS — H00024 Hordeolum internum left upper eyelid: Secondary | ICD-10-CM | POA: Diagnosis not present

## 2015-07-25 DIAGNOSIS — H00025 Hordeolum internum left lower eyelid: Secondary | ICD-10-CM | POA: Diagnosis not present

## 2015-07-25 DIAGNOSIS — H16222 Keratoconjunctivitis sicca, not specified as Sjogren's, left eye: Secondary | ICD-10-CM | POA: Diagnosis not present

## 2015-07-26 DIAGNOSIS — H16222 Keratoconjunctivitis sicca, not specified as Sjogren's, left eye: Secondary | ICD-10-CM | POA: Diagnosis not present

## 2015-07-26 DIAGNOSIS — H01005 Unspecified blepharitis left lower eyelid: Secondary | ICD-10-CM | POA: Diagnosis not present

## 2015-07-31 DIAGNOSIS — I447 Left bundle-branch block, unspecified: Secondary | ICD-10-CM | POA: Diagnosis not present

## 2015-07-31 DIAGNOSIS — R55 Syncope and collapse: Secondary | ICD-10-CM | POA: Diagnosis not present

## 2015-08-09 ENCOUNTER — Telehealth: Payer: Self-pay | Admitting: Family Medicine

## 2015-08-09 MED ORDER — VALACYCLOVIR HCL 1 G PO TABS: 1000.0000 mg | ORAL_TABLET | Freq: Three times a day (TID) | ORAL | Status: DC

## 2015-08-09 NOTE — Telephone Encounter (Signed)
Valtrex 1 g tid for 7 days

## 2015-08-09 NOTE — Telephone Encounter (Signed)
Patients daughter kim calling to see if there is anyway something can be called in for shingles, she has an outbreak this morning, kim daughter  said we called it in last time without her coming in [place]

## 2015-08-09 NOTE — Telephone Encounter (Signed)
Medication called/sent to requested pharmacy and [name] aware via vm

## 2015-09-06 ENCOUNTER — Ambulatory Visit: Payer: Medicare Other | Admitting: Neurology

## 2015-09-06 ENCOUNTER — Telehealth: Payer: Self-pay | Admitting: Neurology

## 2015-09-06 NOTE — Telephone Encounter (Signed)
This patient did not show for a revisit appointment today. 

## 2015-09-11 ENCOUNTER — Encounter: Payer: Self-pay | Admitting: Neurology

## 2015-10-02 ENCOUNTER — Other Ambulatory Visit: Payer: Self-pay | Admitting: Family Medicine

## 2015-10-02 NOTE — Telephone Encounter (Signed)
Refill appropriate and filled per protocol. 

## 2015-10-04 ENCOUNTER — Other Ambulatory Visit: Payer: Self-pay | Admitting: Family Medicine

## 2015-10-04 NOTE — Telephone Encounter (Signed)
Medication refilled per protocol. 

## 2015-10-07 ENCOUNTER — Telehealth: Payer: Self-pay | Admitting: Family Medicine

## 2015-10-07 NOTE — Telephone Encounter (Signed)
LMTRC, this medication is used for hypertension and edema, according to medlist pt is suppose to be taking it.

## 2015-10-07 NOTE — Telephone Encounter (Signed)
[name] daughter of patient called in this morning states her mom picked up chlorthalidone from [facility] yesterday. She wasn't sure if her mom was supposed to take this or not. Please call Maudie Mercury at [phone] to let her know if her mom is supposed to take this and what it is for.

## 2015-10-08 NOTE — Telephone Encounter (Signed)
Pt daughter aware of the usage for this medication, stated was told by her cardiologist to stop taking, advised pt if was told to stop until next appointment then should do this. Pt daughter is requesting med list to be mailed to her address

## 2015-11-18 DIAGNOSIS — I10 Essential (primary) hypertension: Secondary | ICD-10-CM | POA: Diagnosis not present

## 2015-11-18 DIAGNOSIS — R001 Bradycardia, unspecified: Secondary | ICD-10-CM | POA: Diagnosis not present

## 2015-11-18 DIAGNOSIS — R000 Tachycardia, unspecified: Secondary | ICD-10-CM | POA: Diagnosis not present

## 2015-11-19 ENCOUNTER — Encounter: Payer: Self-pay | Admitting: Family Medicine

## 2015-11-19 ENCOUNTER — Other Ambulatory Visit: Payer: Self-pay | Admitting: Family Medicine

## 2015-11-19 NOTE — Telephone Encounter (Signed)
Medication refill for one time only.  Patient needs to be seen.  Letter sent for patient to call and schedule 

## 2015-11-21 ENCOUNTER — Other Ambulatory Visit: Payer: Self-pay | Admitting: Family Medicine

## 2015-11-21 DIAGNOSIS — R001 Bradycardia, unspecified: Secondary | ICD-10-CM | POA: Diagnosis not present

## 2015-11-21 MED ORDER — DONEPEZIL HCL 10 MG PO TABS: ORAL_TABLET | ORAL | Status: DC

## 2015-11-21 MED ORDER — ESCITALOPRAM OXALATE 10 MG PO TABS: ORAL_TABLET | ORAL | Status: DC

## 2015-11-21 MED ORDER — MEMANTINE HCL 10 MG PO TABS: ORAL_TABLET | ORAL | Status: DC

## 2015-11-21 NOTE — Addendum Note (Signed)
Addended by: Shary Decamp B on: 11/21/2015 04:22 PM   Modules accepted: Orders

## 2015-11-27 DIAGNOSIS — R001 Bradycardia, unspecified: Secondary | ICD-10-CM | POA: Diagnosis not present

## 2015-11-27 DIAGNOSIS — R000 Tachycardia, unspecified: Secondary | ICD-10-CM | POA: Diagnosis not present

## 2015-11-27 DIAGNOSIS — I10 Essential (primary) hypertension: Secondary | ICD-10-CM | POA: Diagnosis not present

## 2016-01-31 DIAGNOSIS — B078 Other viral warts: Secondary | ICD-10-CM | POA: Diagnosis not present

## 2016-01-31 DIAGNOSIS — L281 Prurigo nodularis: Secondary | ICD-10-CM | POA: Diagnosis not present

## 2016-01-31 DIAGNOSIS — D225 Melanocytic nevi of trunk: Secondary | ICD-10-CM | POA: Diagnosis not present

## 2016-02-03 ENCOUNTER — Ambulatory Visit (INDEPENDENT_AMBULATORY_CARE_PROVIDER_SITE_OTHER): Payer: Medicare Other | Admitting: Neurology

## 2016-02-03 ENCOUNTER — Telehealth: Payer: Self-pay | Admitting: Neurology

## 2016-02-03 ENCOUNTER — Encounter: Payer: Self-pay | Admitting: Neurology

## 2016-02-03 VITALS — BP 138/84 | HR 62 | Resp 20 | Ht 63.0 in | Wt 104.0 lb

## 2016-02-03 DIAGNOSIS — G250 Essential tremor: Secondary | ICD-10-CM | POA: Diagnosis not present

## 2016-02-03 DIAGNOSIS — I10 Essential (primary) hypertension: Secondary | ICD-10-CM | POA: Diagnosis not present

## 2016-02-03 DIAGNOSIS — F05 Delirium due to known physiological condition: Secondary | ICD-10-CM | POA: Diagnosis not present

## 2016-02-03 DIAGNOSIS — F0390 Unspecified dementia without behavioral disturbance: Secondary | ICD-10-CM | POA: Diagnosis not present

## 2016-02-03 DIAGNOSIS — R413 Other amnesia: Secondary | ICD-10-CM | POA: Diagnosis not present

## 2016-02-03 DIAGNOSIS — R5382 Chronic fatigue, unspecified: Secondary | ICD-10-CM

## 2016-02-03 DIAGNOSIS — E538 Deficiency of other specified B group vitamins: Secondary | ICD-10-CM | POA: Diagnosis not present

## 2016-02-03 DIAGNOSIS — R000 Tachycardia, unspecified: Secondary | ICD-10-CM | POA: Diagnosis not present

## 2016-02-03 DIAGNOSIS — R410 Disorientation, unspecified: Secondary | ICD-10-CM

## 2016-02-03 NOTE — Progress Notes (Signed)
Reason for visit: Confusion  Referring physician: Dr. Louie Bun is a 75 y.o. female  History of present illness:  Ms. Bagby is a 75 year old right-handed white female with a history of memory disturbance and an essential tremor. The patient has been on propranolol in low dose taking 10 mg twice daily. The patient has had a two-month history of issues with heart flutters, she has had a cardiac workup that has not shown any cardiac rhythm abnormalities. The patient had a prolonged heart monitor for 2 weeks, the episodes of heart flutter are daily in nature, no significant cardiac rhythm abnormalities were seen. The patient has had some increased confusion today. The patient was asking if someone lived in her house with her, she has been living with her son for quite some time. The patient has not had any overt hallucinations. She has been suffering some weight loss over the last several months, she has lost about 15 pounds in 2 or 3 months. The patient has had some increase in tremor recently, she has been more staggery, no falls. She was taken off of her propranolol today, switched to long-acting drug, the family does not remember the name of the drug. She has a history of cerebrovascular disease with a stroke event in the past associated with gait instability. She was seen today by Dr. Nadyne Coombes, and she is sent over to this office on an urgent basis secondary to the confusion issue.  Past Medical History  Diagnosis Date  . Hypertension   . Hyperlipidemia   . Anxiety   . GERD (gastroesophageal reflux disease)   . TMJ (dislocation of temporomandibular joint)   . Memory difficulty 08/07/2014  . 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 Dementia   . Gait difficulty 03/05/2015    Past Surgical History  Procedure Laterality Date  . Retinal tear repair cryotherapy    . Cesarean section  1992    Family History  Problem Relation Age of Onset  . Hypertension  Sister   . Tremor Sister     Social history:  reports that she has never smoked. She has never used smokeless tobacco. She reports that she does not drink alcohol or use illicit drugs.  Medications:  Prior to Admission medications   Medication Sig Start Date End Date Taking? Authorizing Provider  atorvastatin (LIPITOR) 80 MG tablet TAKE 1 TABLET BY MOUTH AT BEDTIME 10/04/15  Yes [name], MD  chlorthalidone (HYGROTON) 25 MG tablet TAKE 1 TABLET EVERY DAY 05/27/15  Yes [name], MD  donepezil (ARICEPT) 10 MG tablet TAKE 1 TABLET (10 MG TOTAL) BY MOUTH AT BEDTIME. 11/21/15  Yes [name], MD  escitalopram (LEXAPRO) 10 MG tablet TAKE 1 TABLET (10 MG TOTAL) BY MOUTH DAILY. 11/21/15  Yes [name], MD  memantine (NAMENDA) 10 MG tablet TAKE 1 TABLET (10 MG TOTAL) BY MOUTH 2 (TWO) TIMES DAILY. 11/21/15  Yes [name], MD  Multiple Vitamins-Minerals (PRESERVISION AREDS 2 PO) Take 2 capsules by mouth daily.   Yes Historical Provider, MD  mupirocin ointment (BACTROBAN) 2 %  01/31/16  Yes Historical Provider, MD  omeprazole (PRILOSEC) 20 MG capsule TAKE ONE CAPSULE EVERY DAY 07/19/15  Yes [name], MD  valACYclovir (VALTREX) 1000 MG tablet Take 1 tablet (1,000 mg total) by mouth 3 (three) times daily. 08/09/15  Yes [name], MD  aspirin 81 MG tablet Take 81 mg by mouth daily. Reported on  02/03/2016    Historical Provider, MD     No Known Allergies  ROS:  Out of a complete 14 system review of symptoms, the patient complains only of the following symptoms, and all other reviewed systems are negative.  Short of breath Chest pain, palpitations of the heart, heart murmur Memory loss, dizziness, tremors  Blood pressure 138/84, pulse 62, resp. rate 20, height 5\' 3"  (1.6 m), weight 104 lb (47.174 kg).   Repeat check of heart rate was 120.  Physical Exam  General: The patient is alert and cooperative at the time of the examination.  Eyes: Pupils are  equal, round, and reactive to light. Discs are flat bilaterally.  Neck: The neck is supple, no carotid bruits are noted.  Respiratory: The respiratory examination is clear.  Cardiovascular: The cardiovascular examination reveals a regular rate and rhythm, no obvious murmurs or rubs are noted.  Skin: Extremities are without significant edema.  Neurologic Exam  Mental status: The patient is alert and oriented x 2 at the time of the examination (not oriented to date). The Mini-Mental Status Examination done today shows a total score of 21/30. The patient is able to name 8 animals in 30 seconds.  Cranial nerves: Facial symmetry is present. There is good sensation of the face to pinprick and soft touch bilaterally. The strength of the facial muscles and the muscles to head turning and shoulder shrug are normal bilaterally. Speech is well enunciated, no aphasia or dysarthria is noted. Extraocular movements are full. Visual fields are full. The tongue is midline, and the patient has symmetric elevation of the soft palate. No obvious hearing deficits are noted. A slight side-to-side head tremor seen.  Motor: The motor testing reveals 5 over 5 strength of all 4 extremities. Good symmetric motor tone is noted throughout.  Sensory: Sensory testing is intact to pinprick, soft touch, vibration sensation, and position sense on all 4 extremities, with exception of some impairment of position sense in both feet. No evidence of extinction is noted.  Coordination: Cerebellar testing reveals good finger-nose-finger and heel-to-shin bilaterally. An intention tremor seen with finger-nose-finger.  Gait and station: Gait is normal. Tandem gait is normal. Romberg is negative. No drift is seen.  Reflexes: Deep tendon reflexes are symmetric and normal bilaterally. Toes are downgoing bilaterally.   Assessment/Plan:  1. Essential tremor  2. Memory disturbance  3. Recent confusion  4. Recent history of "heart  flutters", increased tremor, weight loss  The patient does appear to be tachycardic on evaluation today. The patient denies any underlying sensation of anxiety. Given the weight loss, increased tremors, confusion, and increased heart rate, the patient will be set up for blood work to look for hyperthyroidism or other etiologies of confusion. Because of the history of cerebrovascular disease, MRI of the brain will be done. The patient will follow-up in April for a previously scheduled appointment.  Jill Alexanders MD 02/03/2016 7:42 PM  [facility] [address] [place], [place] [address]  Phone [phone] Fax [phone]

## 2016-02-03 NOTE — Telephone Encounter (Signed)
Please call Ernst Breach, NP with [facility] about Ms. Gallina's tremors.  She is wanting [place] to work the patient in.  Thanks!

## 2016-02-03 NOTE — Patient Instructions (Signed)
Tremor °A tremor is trembling or shaking that you cannot control. Most tremors affect the hands or arms. Tremors can also affect the head, vocal cords, face, and other parts of the body. There are many types of tremors. Common types include:  °· Essential tremor. These usually occur in people over the age of 40. It may run in families and can happen in otherwise healthy people.   °· Resting tremor. These occur when the muscles are at rest, such as when your hands are resting in your lap. People with Parkinson disease often have resting tremors.   °· Postural tremor. These occur when you try to hold a pose, such as keeping your hands outstretched.   °· Kinetic tremor. These occur during purposeful movement, such as trying to touch a finger to your nose.   °· Task-specific tremor. These may occur when you perform tasks such as handwriting, speaking, or standing.   °· Psychogenic tremor. These dramatically lessen or disappear when you are distracted. They can happen in people of all ages.   °Some types of tremors have no known cause. Tremors can also be a symptom of nervous system problems (neurological disorders) that may occur with aging. Some tremors go away with treatment while others do not.  °HOME CARE INSTRUCTIONS °Watch your tremor for any changes. The following actions may help to lessen any discomfort you are feeling: °· Take medicines only as directed by your health care provider.   °· Limit alcohol intake to no more than 1 drink per day for nonpregnant women and 2 drinks per day for men. One drink equals 12 oz of beer, 5 oz of wine, or 1½ oz of hard liquor. °· Do not use any tobacco products, including cigarettes, chewing tobacco, or electronic cigarettes. If you need help quitting, ask your health care provider.   °· Avoid extreme heat or cold.    °· Limit the amount of caffeine you consume as directed by your health care provider.   °· Try to get 8 hours of sleep each night. °· Find ways to manage your  stress, such as meditation or yoga. °· Keep all follow-up visits as directed by your health care provider. This is important. °SEEK MEDICAL CARE IF: °· You start having a tremor after starting a new medicine. °· You have tremor with other symptoms such as: °¨ Numbness. °¨ Tingling. °¨ Pain. °¨ Weakness. °· Your tremor gets worse. °· Your tremor interferes with your day-to-day life. °  °This information is not intended to replace advice given to you by your health care provider. Make sure you discuss any questions you have with your health care provider. °  °Document Released: 11/06/2002 Document Revised: 12/07/2014 Document Reviewed: 05/14/2014 °Elsevier Interactive Patient Education ©2016 [employer]vier Inc. ° °

## 2016-02-03 NOTE — Telephone Encounter (Signed)
I returned Ernst Breach, NP's phone call. She reports that pt was seen today at [facility] for chest pain, it was worked up, and pt is ok from a cardiovascular standpoint. However, pt and pt's daughter was concerned about pt's tremors and memory being worse. She wants the pt worked in ASAP with Dr. Jannifer Franklin. Dr. Jannifer Franklin had a 2:30 cancellation today and so I called pt's daughter and she is agreeable to being here at 2:30 to be seen by Dr. Jannifer Franklin.

## 2016-02-04 ENCOUNTER — Other Ambulatory Visit: Payer: Medicare Other

## 2016-02-04 ENCOUNTER — Encounter: Payer: Self-pay | Admitting: *Deleted

## 2016-02-04 LAB — COMPREHENSIVE METABOLIC PANEL
ALT: 13 IU/L (ref 0–32)
AST: 19 IU/L (ref 0–40)
Albumin/Globulin Ratio: 1.5 (ref 1.1–2.5)
Albumin: 4 g/dL (ref 3.5–4.8)
Alkaline Phosphatase: 101 IU/L (ref 39–117)
BUN/Creatinine Ratio: 10 — ABNORMAL LOW (ref 11–26)
BUN: 9 mg/dL (ref 8–27)
Bilirubin Total: 0.3 mg/dL (ref 0.0–1.2)
CALCIUM: 9.2 mg/dL (ref 8.7–10.3)
CO2: 22 mmol/L (ref 18–29)
CREATININE: 0.89 mg/dL (ref 0.57–1.00)
Chloride: 100 mmol/L (ref 96–106)
GFR, EST AFRICAN AMERICAN: 74 mL/min/{1.73_m2} (ref >59)
GFR, EST NON AFRICAN AMERICAN: 65 mL/min/{1.73_m2} (ref >59)
GLUCOSE: 93 mg/dL (ref 65–99)
Globulin, Total: 2.6 g/dL (ref 1.5–4.5)
Potassium: 4.1 mmol/L (ref 3.5–5.2)
Sodium: 139 mmol/L (ref 134–144)
TOTAL PROTEIN: 6.6 g/dL (ref 6.0–8.5)

## 2016-02-04 LAB — CBC WITH DIFFERENTIAL/PLATELET
BASOS: 1 %
Basophils Absolute: 0 10*3/uL (ref 0.0–0.2)
EOS (ABSOLUTE): 0.1 10*3/uL (ref 0.0–0.4)
EOS: 2 %
HEMOGLOBIN: 12.7 g/dL (ref 11.1–15.9)
Hematocrit: 37.9 % (ref 34.0–46.6)
IMMATURE GRANS (ABS): 0 10*3/uL (ref 0.0–0.1)
Immature Granulocytes: 0 %
LYMPHS: 26 %
Lymphocytes Absolute: 2.1 10*3/uL (ref 0.7–3.1)
MCH: 32.6 pg (ref 26.6–33.0)
MCHC: 33.5 g/dL (ref 31.5–35.7)
MCV: 97 fL (ref 79–97)
MONOCYTES: 8 %
Monocytes Absolute: 0.7 10*3/uL (ref 0.1–0.9)
NEUTROS ABS: 5.1 10*3/uL (ref 1.4–7.0)
Neutrophils: 63 %
PLATELETS: 260 10*3/uL (ref 150–379)
RBC: 3.9 x10E6/uL (ref 3.77–5.28)
RDW: 13.4 % (ref 12.3–15.4)
WBC: 8.1 10*3/uL (ref 3.4–10.8)

## 2016-02-04 LAB — VITAMIN B12: VITAMIN B 12: 394 pg/mL (ref 211–946)

## 2016-02-04 LAB — T4, FREE: FREE T4: 1 ng/dL (ref 0.82–1.77)

## 2016-02-04 LAB — TSH: TSH: 6.75 u[IU]/mL — ABNORMAL HIGH (ref 0.450–4.500)

## 2016-02-05 ENCOUNTER — Telehealth: Payer: Self-pay | Admitting: Neurology

## 2016-02-05 NOTE — Telephone Encounter (Signed)
I called the daughter. The blood work showed mild hypothyroidism. TSH was slightly elevated. I do not think this is a cause of the high heart rate and the anxiety issues the patient was experiencing. The patient will have MRI the brain, I will follow-up in April.

## 2016-02-05 NOTE — Telephone Encounter (Signed)
Theresa Sloan [phone] called to request lab results and to advise pharmacy change to [facility], [address].

## 2016-02-05 NOTE — Telephone Encounter (Signed)
Returned pt's daughter's [name]'s call. I advised her that Dr. Jannifer Franklin looked at pt's bloodwork and remarked that "Blood work is unremarkable with exception that the thyroid appears to be slightly low."  Pt's daughter is still very concerned about her mother's dementia and her EKG. She wants Dr. Jannifer Franklin to give her a call to discuss further. [name]'s number is [phone].

## 2016-02-09 ENCOUNTER — Ambulatory Visit
Admission: RE | Admit: 2016-02-09 | Discharge: 2016-02-09 | Disposition: A | Discharge status: Home / Self Care | Payer: Medicare Other | Source: Ambulatory Visit | Attending: Neurology | Admitting: Neurology

## 2016-02-09 DIAGNOSIS — R413 Other amnesia: Secondary | ICD-10-CM | POA: Diagnosis not present

## 2016-02-09 DIAGNOSIS — F05 Delirium due to known physiological condition: Secondary | ICD-10-CM | POA: Diagnosis not present

## 2016-02-09 DIAGNOSIS — R410 Disorientation, unspecified: Secondary | ICD-10-CM

## 2016-02-09 DIAGNOSIS — G250 Essential tremor: Secondary | ICD-10-CM

## 2016-02-09 DIAGNOSIS — F0390 Unspecified dementia without behavioral disturbance: Secondary | ICD-10-CM

## 2016-02-09 DIAGNOSIS — R5382 Chronic fatigue, unspecified: Secondary | ICD-10-CM

## 2016-02-10 ENCOUNTER — Telehealth: Payer: Self-pay | Admitting: Neurology

## 2016-02-10 NOTE — Telephone Encounter (Signed)
I called patient. Minimal white matter changes on the brain are noted, no change from 2015. Some atrophy is seen. The patient has a follow-up in April 2017.  MRI brain 02/10/16:  IMPRESSION: This MRI of the brain without contrast shows the following: 1. Moderate cortical atrophy that is most pronounced in the mesial temporal lobes. The extent of atrophy has increased mildly when compared to the MRI dated 11/15/2014. 2. T2/FLAIR hyperintense foci in the hemispheres most consistent with chronic microvascular ischemic change. The extent of the white matter foci is essentially unchanged when compared to the prior MRI. 3. There are no acute findings.

## 2016-02-28 ENCOUNTER — Telehealth: Payer: Self-pay | Admitting: Family Medicine

## 2016-02-28 NOTE — Telephone Encounter (Signed)
What med is she referring to?

## 2016-02-28 NOTE — Telephone Encounter (Signed)
She is referring to Valtrex - states that the shingles have come back on hip

## 2016-02-28 NOTE — Telephone Encounter (Signed)
Patient had a bad night with shingles, and needs refill on her med if possible [facility]

## 2016-03-02 MED ORDER — VALACYCLOVIR HCL 1 G PO TABS: 1000.0000 mg | ORAL_TABLET | Freq: Three times a day (TID) | ORAL | Status: DC

## 2016-03-02 NOTE — Telephone Encounter (Signed)
Valtrex 1 g potid for 7 days 

## 2016-03-02 NOTE — Telephone Encounter (Signed)
Med sent to pharm and pt's daughter aware

## 2016-03-09 ENCOUNTER — Telehealth: Payer: Self-pay | Admitting: Neurology

## 2016-03-09 NOTE — Telephone Encounter (Signed)
Returned call to patient's daughter. No answer, left message to keep appt on Thursday to review MRI results in more detail and discuss further.

## 2016-03-09 NOTE — Telephone Encounter (Signed)
Daughter called wanting to know if pt still needs to come in on Thursday. MRI has been done and results have been given. Please call and advise [phone] may leave message.

## 2016-03-10 ENCOUNTER — Telehealth: Payer: Self-pay | Admitting: Neurology

## 2016-03-10 NOTE — Telephone Encounter (Addendum)
Dr Jannifer Franklin- are you ok with this? Can she see a nurse practitioner?

## 2016-03-10 NOTE — Telephone Encounter (Signed)
Daughter Maudie Mercury called, needs to change 03/12/16 appointment, unable to get off work to bring mother to appointment, next availability is 08/28/16, wants to know if okay to wait until September for this appointment? Please advise.

## 2016-03-10 NOTE — Telephone Encounter (Signed)
I called the daughter. The patient seems to be doing relatively well this point. They have concerned about her driving, I have indicated that she should not operate a motor vehicle at this time if there are safety concerns. The patient could potentially wait until September for her next revisit. If it turns out that that something has happened, and she needs to be seen sooner, okay to see nurse practitioner.

## 2016-03-10 NOTE — Telephone Encounter (Signed)
Patient called back. Please contact when info available.  Thanks!

## 2016-03-11 NOTE — Telephone Encounter (Signed)
Pt has 6 month follow-up scheduled for 09/11/16 @ 9 a.m.

## 2016-03-12 ENCOUNTER — Ambulatory Visit: Payer: Medicare Other | Admitting: Neurology

## 2016-03-13 ENCOUNTER — Other Ambulatory Visit: Payer: Self-pay | Admitting: Family Medicine

## 2016-03-13 NOTE — Telephone Encounter (Signed)
Requesting another refill - [facility] 03/02/16 - ? OK to Refill

## 2016-03-13 NOTE — Telephone Encounter (Signed)
ok 

## 2016-03-13 NOTE — Telephone Encounter (Signed)
Refill appropriate and filled per protocol. 

## 2016-03-19 ENCOUNTER — Telehealth: Payer: Self-pay | Admitting: Neurology

## 2016-03-19 ENCOUNTER — Telehealth: Payer: Self-pay | Admitting: Family Medicine

## 2016-03-19 MED ORDER — ESCITALOPRAM OXALATE 20 MG PO TABS: 20.0000 mg | ORAL_TABLET | Freq: Every day | ORAL | Status: DC

## 2016-03-19 NOTE — Telephone Encounter (Signed)
Patient's daughter is calling regarding the patient. She states last night the patient was irate, in denial with dementia, wanted to drive and wanted to stop taking her medication. She would like to receive a call from Dr. Jannifer Franklin only.

## 2016-03-19 NOTE — Addendum Note (Signed)
Addended by: Margette Fast on: 03/19/2016 05:19 PM   Modules accepted: Orders, Medications

## 2016-03-19 NOTE — Telephone Encounter (Signed)
I called the daughter. The patient has had some increased problems with sundowning towards evenings over the last week. She is starting to refuse to take her medications, denying that she has memory issues, one in the driver car. She is also not eating well, maybe losing weight again. I will go up on the Lexapro to 20 mg daily, if she continues to have difficulty with eating and losing weight, the Aricept dose may need to be dropped.

## 2016-03-19 NOTE — Telephone Encounter (Signed)
Theresa Sloan would like you to call her back she has also placed a call to her mom's neurologist about an episode patient had yesterday with dementia.  CB# [phone]

## 2016-03-19 NOTE — Telephone Encounter (Signed)
Spoke to daughter and she states that her mother is not eating and being very irate and irrational. She wanted to know if we could increase her medication and if there was a medication she could take to increase appetite. Informed pt that it would be wise to schedule an appointment to discuss all this with Dr. Dennard Schaumann. She agreed and will call back to schedule.

## 2016-03-22 ENCOUNTER — Encounter: Payer: Self-pay | Admitting: Neurology

## 2016-03-26 ENCOUNTER — Telehealth: Payer: Self-pay | Admitting: Family Medicine

## 2016-03-26 NOTE — Telephone Encounter (Signed)
Oh and she is taking Propranolol also 10 mg's from Dr. Nadyne Coombes

## 2016-03-26 NOTE — Telephone Encounter (Signed)
Called and spoke to [place] and she states that her mother is getting really bad as far as the cussing people out, being mean and not taking medications right and she does not know what to do with her. She has been off Aricept since Sunday as per Dr, Tobey Grim instruction and increased her Lexapro to 20mg  qd. She is just asking if there is anything else she can do for her? Maudie Mercury stated that she would be glad to make an appt to discuss if you need her to as she is off work today.

## 2016-03-26 NOTE — Telephone Encounter (Signed)
Unfortunately, probably not.  We can use risperdal for delirium and agitation but it doesn't sound it is that bad yet.

## 2016-03-26 NOTE — Telephone Encounter (Signed)
Patients daughter kim calling to talk to you regarding patient, says it is somewhat urgent with the way her mom is acting, does not know what to do  [phone]

## 2016-03-27 NOTE — Telephone Encounter (Signed)
Spoke to [place] on 03/26/16 and made aware of providers recommendations.

## 2016-04-03 ENCOUNTER — Other Ambulatory Visit: Payer: Self-pay | Admitting: Family Medicine

## 2016-04-10 ENCOUNTER — Other Ambulatory Visit: Payer: Self-pay | Admitting: Family Medicine

## 2016-04-10 NOTE — Telephone Encounter (Signed)
Refill appropriate and filled per protocol. 

## 2016-04-14 ENCOUNTER — Other Ambulatory Visit: Payer: Self-pay | Admitting: *Deleted

## 2016-04-14 MED ORDER — ATORVASTATIN CALCIUM 80 MG PO TABS: 80.0000 mg | ORAL_TABLET | Freq: Every day | ORAL | Status: DC

## 2016-04-14 NOTE — Telephone Encounter (Signed)
Received fax requesting refill on Lipitor with 90 day supply.   Refill appropriate and filled per protocol.

## 2016-07-09 ENCOUNTER — Telehealth: Payer: Self-pay | Admitting: *Deleted

## 2016-07-09 NOTE — Telephone Encounter (Signed)
Patient's daughter Maudie Mercury called and would like to talk to Dr. Dennard Schaumann about her mothers  medications and other issues before her appt on 07/17/16. She states that the patient has dementia and didn't want to talk in front of the patient . She is requesting a call back today if possible.  Her contact number is [phone]. Please advise. Thanks

## 2016-07-13 NOTE — Telephone Encounter (Signed)
Daughter really wants to have a private conversation with someone (Dr Cindi Carbon) before she comes with mother on Friday for appt.

## 2016-07-14 NOTE — Telephone Encounter (Signed)
Theresa Sloan, Theresa Sloan's daughter called back asking that Dr. Dennard Schaumann call her today regarding her mother. She has some things she wants to discuss with him before her appt on Friday. Please give her a call when possible.  [name]'s ph# [phone] Thank you.

## 2016-07-15 ENCOUNTER — Encounter: Payer: Self-pay | Admitting: Family Medicine

## 2016-07-15 NOTE — Telephone Encounter (Signed)
LMOVM to send mychart message or call us back as she has called multiple times and sent emails.

## 2016-07-17 ENCOUNTER — Ambulatory Visit (INDEPENDENT_AMBULATORY_CARE_PROVIDER_SITE_OTHER): Payer: Medicare Other | Admitting: Family Medicine

## 2016-07-17 ENCOUNTER — Encounter: Payer: Self-pay | Admitting: Family Medicine

## 2016-07-17 VITALS — BP 130/74 | HR 60 | Temp 98.1°F | Resp 14 | Ht 62.0 in | Wt 117.0 lb

## 2016-07-17 DIAGNOSIS — R350 Frequency of micturition: Secondary | ICD-10-CM | POA: Diagnosis not present

## 2016-07-17 DIAGNOSIS — F0390 Unspecified dementia without behavioral disturbance: Secondary | ICD-10-CM

## 2016-07-17 MED ORDER — RIVASTIGMINE 4.6 MG/24HR TD PT24: 4.6000 mg | MEDICATED_PATCH | Freq: Every day | TRANSDERMAL | 12 refills | Status: DC

## 2016-07-18 ENCOUNTER — Encounter: Payer: Self-pay | Admitting: Family Medicine

## 2016-07-18 ENCOUNTER — Other Ambulatory Visit: Payer: Self-pay | Admitting: Neurology

## 2016-07-20 ENCOUNTER — Encounter: Payer: Self-pay | Admitting: Neurology

## 2016-07-20 ENCOUNTER — Other Ambulatory Visit: Payer: Self-pay | Admitting: *Deleted

## 2016-07-20 MED ORDER — ESCITALOPRAM OXALATE 20 MG PO TABS: 20.0000 mg | ORAL_TABLET | Freq: Every day | ORAL | 2 refills | Status: DC

## 2016-07-20 NOTE — Progress Notes (Signed)
Subjective:    Patient ID: Theresa Sloan, female    DOB: 1941-05-04, 75 y.o.   MRN: [id number]  HPI  05/2015 History is very difficult to obtain. Patient is a 75 year old Caucasian female with a history of dementia. Therefore she cannot provide an accurate history. She is accompanied by her grandson who states that the patient was riding in a car with him.  When she stood to get out of the car, she commented that she didn't feel well. She held onto the car for a few seconds and then lost consciousness and collapsed to the ground. By the time he ran to the other side of the car to check on her, she did regain consciousness. However she has no recollection of the event. She states today that she feels fine and she denies any chest pain shortness of breath or dyspnea on exertion. She denies any palpitations although I am unsure how much we can trust this given her dementia.  At that time, my plan was:  given the patient's age, I believe she is a cardiology referral for an echocardiogram of the heart, an event monitor to evaluate for any cardiac arrhythmias that could cause syncope. I will expedite this referral as soon as possible. I will also check a basic lab work to ensure that the patient is not anemic or dehydrated.  Given the orthostatic nature of the event I'm concerned it may have been due to hypotension possibly from dehydration. Therefore I recommended that she stop chlorthalidone temporarily until we rule out cardiac arrhythmias.  EKG today reveals mild sinus bradycardia with a left bundle branch block. There is no significant change from her previous EKGs in the chart. We will proceed with cardiology evaluation.  07/20/16 Patient has not been seen since. She is here today accompanied by her daughter. Since I last saw the patient, she discontinued Aricept due to weight loss and "aggressive behavior". She is still on Namenda. Her daughter became her power of attorney because the patient was  writing checks to her son. Most recently she waited $2000 check to her son that she has no recollection of. Daughter feels that the son is taking advantage of his mother. Therefore she is assumed power of attorney. Dementia seems to be getting worse. She now lives at home with her grandson. She will frequently pack her belongings in a crate and ask her daughter to take her home and she is actually living at her home. She will frequently become confused and disoriented. She is no longer driving. Short-term memory problems seem to be increasing. At the present time there are no hallucinations. Past Medical History:  Diagnosis Date  . Anxiety   . Dementia   . 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 Gait difficulty 03/05/2015  . GERD (gastroesophageal reflux disease)   . Hyperlipidemia   . Hypertension   . Memory difficulty 08/07/2014  . TMJ (dislocation of temporomandibular joint)    Past Surgical History:  Procedure Laterality Date  . CESAREAN SECTION  1992  . RETINAL TEAR REPAIR CRYOTHERAPY     Current Outpatient Prescriptions on File Prior to Visit  Medication Sig Dispense Refill  . aspirin 81 MG tablet Take 81 mg by mouth daily. Reported on 02/03/2016    . atorvastatin (LIPITOR) 80 MG tablet Take 1 tablet (80 mg total) by mouth daily with breakfast. 90 tablet 1  . chlorthalidone (HYGROTON) 25 MG tablet TAKE 1 TABLET EVERY DAY 30 tablet 5  .  escitalopram (LEXAPRO) 20 MG tablet Take 1 tablet (20 mg total) by mouth daily. 30 tablet 3  . memantine (NAMENDA) 10 MG tablet TAKE 1 TABLET (10 MG TOTAL) BY MOUTH 2 (TWO) TIMES DAILY. 90 tablet 5  . Multiple Vitamins-Minerals (PRESERVISION AREDS 2 PO) Take 2 capsules by mouth daily.    . mupirocin ointment (BACTROBAN) 2 %     . omeprazole (PRILOSEC) 20 MG capsule TAKE ONE CAPSULE BY MOUTH ONCE DAILY 90 capsule 4  . valACYclovir (VALTREX) 1000 MG tablet TAKE ONE TABLET BY MOUTH THREE TIMES DAILY 21 tablet 0  . donepezil (ARICEPT) 10 MG  tablet TAKE 1 TABLET (10 MG TOTAL) BY MOUTH AT BEDTIME. (Patient not taking: Reported on 07/17/2016) 90 tablet 1   No current facility-administered medications on file prior to visit.    No Known Allergies Social History   Social History  . Marital status: Married    Spouse name: N/A  . Number of children: 2  . Years of education: 74 th   Occupational History  . retired    Social History Main Topics  . Smoking status: Never Smoker  . Smokeless tobacco: Never Used  . Alcohol use No  . Drug use: No  . Sexual activity: Not on file   Other Topics Concern  . Not on file   Social History Narrative   Patient is right handed.   Patient does not drink caffeine.      Review of Systems  Genitourinary: Positive for frequency.  All other systems reviewed and are negative.      Objective:   Physical Exam  Constitutional: She appears well-developed and well-nourished.  Cardiovascular: Normal rate, regular rhythm, normal heart sounds and intact distal pulses.   No murmur heard. Pulmonary/Chest: Effort normal and breath sounds normal. No respiratory distress. She has no wheezes. She has no rales.  Abdominal: Soft. Bowel sounds are normal. She exhibits no distension. There is no tenderness. There is no rebound and no guarding.  Musculoskeletal: She exhibits no edema.  Vitals reviewed.         Assessment & Plan:  Frequent urination - Plan: Urinalysis, Routine w reflex microscopic (not at [facility])  Dementia, without behavioral disturbance - Plan: rivastigmine (EXELON) 4.6 mg/24hr  Urinalysis reveals no urinary tract infection. TE Namenda XR. Add Exelon patches 4.6 mg per 24 hours. Increased to 9.2 mg in one month if the patient is tolerating the patch without difficulty. Agree with daughter assuming power of care responsibilities. Daughter is also concerned because the patient is extremely tearful. This is primarily at night but even during the day she will find her crying for no  reason. She is extremely anxious and sad. Daughter does not believe that Lexapro is helping like it use to. Once patient is tolerating Exelon with no evidence of side effects, I would consider augmenting Lexapro with Wellbutrin

## 2016-08-04 DIAGNOSIS — H35363 Drusen (degenerative) of macula, bilateral: Secondary | ICD-10-CM | POA: Diagnosis not present

## 2016-08-04 DIAGNOSIS — H5201 Hypermetropia, right eye: Secondary | ICD-10-CM | POA: Diagnosis not present

## 2016-08-04 DIAGNOSIS — H524 Presbyopia: Secondary | ICD-10-CM | POA: Diagnosis not present

## 2016-08-04 DIAGNOSIS — H04123 Dry eye syndrome of bilateral lacrimal glands: Secondary | ICD-10-CM | POA: Diagnosis not present

## 2016-08-04 DIAGNOSIS — H52223 Regular astigmatism, bilateral: Secondary | ICD-10-CM | POA: Diagnosis not present

## 2016-08-04 DIAGNOSIS — H5212 Myopia, left eye: Secondary | ICD-10-CM | POA: Diagnosis not present

## 2016-08-04 DIAGNOSIS — H59812 Chorioretinal scars after surgery for detachment, left eye: Secondary | ICD-10-CM | POA: Diagnosis not present

## 2016-08-14 ENCOUNTER — Encounter: Payer: Self-pay | Admitting: Family Medicine

## 2016-08-19 MED ORDER — BUPROPION HCL ER (XL) 150 MG PO TB24: 150.0000 mg | ORAL_TABLET | Freq: Every day | ORAL | 5 refills | Status: DC

## 2016-08-26 ENCOUNTER — Telehealth: Payer: Self-pay | Admitting: Family Medicine
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
## 2016-08-27 NOTE — Telephone Encounter (Signed)
the patient's dtr aware and they have a f/u appt tomorrrow

## 2016-08-27 NOTE — Telephone Encounter (Signed)
Let's hold wellbutrin and see if agitation improves, if not ntbs to rule out infection (UTI?)

## 2016-08-28 ENCOUNTER — Ambulatory Visit (INDEPENDENT_AMBULATORY_CARE_PROVIDER_SITE_OTHER): Payer: Medicare Other | Admitting: Family Medicine

## 2016-08-28 VITALS — BP 140/76 | HR 58 | Temp 98.1°F | Resp 14 | Ht 62.0 in | Wt 116.0 lb

## 2016-08-28 DIAGNOSIS — F0390 Unspecified dementia without behavioral disturbance: Secondary | ICD-10-CM

## 2016-08-28 DIAGNOSIS — R410 Disorientation, unspecified: Secondary | ICD-10-CM

## 2016-08-28 LAB — URINALYSIS, ROUTINE W REFLEX MICROSCOPIC
Bilirubin Urine: NEGATIVE
Glucose, UA: NEGATIVE
HGB URINE DIPSTICK: NEGATIVE
LEUKOCYTES UA: NEGATIVE
NITRITE: NEGATIVE
PROTEIN: NEGATIVE
Specific Gravity, Urine: >1.03 (ref 1.001–1.035)
pH: 5.5 (ref 5.0–8.0)

## 2016-08-28 MED ORDER — ARIPIPRAZOLE 2 MG PO TABS: 2.0000 mg | ORAL_TABLET | Freq: Every day | ORAL | 5 refills | Status: DC
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
## 2016-08-31 ENCOUNTER — Encounter: Payer: Self-pay | Admitting: Family Medicine

## 2016-09-09 ENCOUNTER — Telehealth: Payer: Self-pay | Admitting: Neurology

## 2016-09-09 NOTE — Telephone Encounter (Signed)
PT DAUGHTER [name] REQ DR WILLIS REVIEW PCP DR Dennard Schaumann NOTES FROM PREVIOUS VISITS ON VISIT 10/12  CB

## 2016-09-11 ENCOUNTER — Telehealth: Payer: Self-pay | Admitting: Neurology

## 2016-09-11 ENCOUNTER — Ambulatory Visit (INDEPENDENT_AMBULATORY_CARE_PROVIDER_SITE_OTHER): Payer: Medicare Other | Admitting: Neurology

## 2016-09-11 ENCOUNTER — Encounter: Payer: Self-pay | Admitting: Neurology

## 2016-09-11 VITALS — BP 142/63 | HR 59 | Ht 62.0 in | Wt 117.0 lb

## 2016-09-11 DIAGNOSIS — R413 Other amnesia: Secondary | ICD-10-CM | POA: Diagnosis not present

## 2016-09-11 DIAGNOSIS — G250 Essential tremor: Secondary | ICD-10-CM | POA: Diagnosis not present

## 2016-09-11 MED ORDER — RIVASTIGMINE 9.5 MG/24HR TD PT24: 9.5000 mg | MEDICATED_PATCH | Freq: Every day | TRANSDERMAL | 5 refills | Status: DC

## 2016-09-11 NOTE — Telephone Encounter (Signed)
I called the patient, talk with the family. Using two 4.6 patches would be reasonable, they can also use of this single patches of the 4.6 first, then go to the 9.5 patch.

## 2016-09-11 NOTE — Progress Notes (Signed)
Reason for visit: Memory disturbance  Theresa Sloan is an 75 y.o. female  History of present illness:  Theresa Sloan is a 75 year old right-handed white female with a history of a memory disturbance. 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 The patient has been on Namenda and the low-dose Exelon patch. She is tolerating the patch well. She has not noted any significant change in her cognitive status since last seen, her family agrees with this. The patient does not operate a motor vehicle. She returns for an evaluation. The patient has an essential tremor, but she apparently does not have much disability associated with the tremor.  Past Medical History:  Diagnosis Date  . Anxiety   . Dementia   . 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 Gait difficulty 03/05/2015  . GERD (gastroesophageal reflux disease)   . Hyperlipidemia   . Hypertension   . Memory difficulty 08/07/2014  . TMJ (dislocation of temporomandibular joint)     Past Surgical History:  Procedure Laterality Date  . CESAREAN SECTION  1992  . RETINAL TEAR REPAIR CRYOTHERAPY      Family History  Problem Relation Age of Onset  . Hypertension Sister   . Tremor Sister     Social history:  reports that she has never smoked. She has never used smokeless tobacco. She reports that she does not drink alcohol or use drugs.   No Known Allergies  Medications:  Prior to Admission medications   Medication Sig Start Date End Date Taking? Authorizing Provider  ARIPiprazole (ABILIFY) 2 MG tablet Take 1 tablet (2 mg total) by mouth daily. 08/28/16  Yes [name], MD  aspirin 81 MG tablet Take 81 mg by mouth daily. Reported on 02/03/2016   Yes Historical Provider, MD  atorvastatin (LIPITOR) 80 MG tablet Take 1 tablet (80 mg total) by mouth daily with breakfast. 04/14/16  Yes [name], MD  chlorthalidone (HYGROTON) 25 MG tablet TAKE 1 TABLET EVERY DAY 05/27/15  Yes [name], MD  escitalopram (LEXAPRO) 20 MG tablet Take 1 tablet (20 mg total) by mouth daily. 07/20/16  Yes Kathrynn Ducking, MD  memantine (NAMENDA) 10 MG tablet TAKE 1 TABLET (10 MG TOTAL) BY MOUTH 2 (TWO) TIMES DAILY. 11/21/15  Yes [name], MD  Multiple Vitamins-Minerals (PRESERVISION AREDS 2 PO) Take 2 capsules by mouth daily.   Yes Historical Provider, MD  omeprazole (PRILOSEC) 20 MG capsule TAKE ONE CAPSULE BY MOUTH ONCE DAILY 04/06/16  Yes [name], MD  propranolol ER (INDERAL LA) 60 MG 24 hr capsule Take 60 mg by mouth daily.  07/14/16  Yes Historical Provider, MD  rivastigmine (EXELON) 4.6 mg/24hr Place 1 patch (4.6 mg total) onto the skin daily. 07/17/16  Yes [name], MD  valACYclovir (VALTREX) 1000 MG tablet TAKE ONE TABLET BY MOUTH THREE TIMES DAILY 03/13/16  Yes [name], MD    ROS:  Out of a complete 14 system review of symptoms, the patient complains only of the following symptoms, and all other reviewed systems are negative.  Heart murmur Memory loss, tremors Confusion, depression, anxiety Moles  Blood pressure (!) 142/63, pulse (!) 59, height 5\' 2"  (1.575 m), weight 117 lb (53.1 kg).  Physical Exam  General: The patient is alert and cooperative at the time of the examination.  Skin: No significant  peripheral edema is noted.   Neurologic Exam  Mental status: The patient is alert and oriented x 2 at the time of the examination (not oriented to date). The Mini-Mental Status Examination done today shows total score 23/30.   Cranial nerves: Facial symmetry is present. Speech is normal, no aphasia or dysarthria is noted. Extraocular movements are full. Visual fields are full. A side-to-side head and neck tremor is noted.  Motor: The patient has good strength in all 4 extremities.  Sensory examination: Soft touch sensation is symmetric on the  face, arms, and legs.  Coordination: The patient has good finger-nose-finger and heel-to-shin bilaterally. The patient does have an intention tremor with finger-nose-finger bilaterally.  Gait and station: The patient has a normal gait. Tandem gait is minimally unsteady. Romberg is negative. No drift is seen.  Reflexes: Deep tendon reflexes are symmetric.   MRI brain 02/10/16:  IMPRESSION: This MRI of the brain without contrast shows the following: 1. Moderate cortical atrophy that is most pronounced in the mesial temporal lobes. The extent of atrophy has increased mildly when compared to the MRI dated 11/15/2014. 2. T2/FLAIR hyperintense foci in the hemispheres most consistent with chronic microvascular ischemic change. The extent of the white matter foci is essentially unchanged when compared to the prior MRI. 3. There are no acute findings.  * MRI scan images were reviewed online. I agree with the written report.    Assessment/Plan:  1. Memory disturbance  2. Essential tremor  The patient will go up on the dose of the Exelon patch to the maintenance dose of 9.5 mg. The prescription was called in. She will follow-up in 6 or 7 months, sooner if needed. The family is to contact me if she is not tolerating the increased dose.  Jill Alexanders MD 09/11/2016 9:26 AM  [facility] [address] [place], [place] [address]  Phone [phone] Fax [phone]

## 2016-09-11 NOTE — Telephone Encounter (Signed)
[facility] called to see if it is ok to give pt rivastigmine (EXELON) 9.5 mg/24hr after she just picked up a 4.6mg  dose on 10/9. Please call and advise [phone]

## 2016-09-29 ENCOUNTER — Other Ambulatory Visit: Payer: Self-pay | Admitting: Family Medicine

## 2016-10-12 ENCOUNTER — Encounter: Payer: Self-pay | Admitting: Family Medicine

## 2016-10-12 ENCOUNTER — Encounter: Payer: Self-pay | Admitting: Neurology

## 2016-10-13 ENCOUNTER — Other Ambulatory Visit: Payer: Self-pay | Admitting: Neurology

## 2016-10-13 MED ORDER — ATORVASTATIN CALCIUM 80 MG PO TABS: 80.0000 mg | ORAL_TABLET | Freq: Every day | ORAL | 1 refills | Status: DC

## 2016-10-13 MED ORDER — OMEPRAZOLE 20 MG PO CPDR: 20.0000 mg | DELAYED_RELEASE_CAPSULE | Freq: Every day | ORAL | 4 refills | Status: AC

## 2016-10-13 NOTE — Telephone Encounter (Signed)
Refilled prescriptions as requested and message forwarded to Dr. Dennard Schaumann

## 2016-11-02 ENCOUNTER — Encounter: Payer: Self-pay | Admitting: Family Medicine

## 2016-11-03 ENCOUNTER — Other Ambulatory Visit: Payer: Self-pay | Admitting: Family Medicine

## 2016-11-03 MED ORDER — ARIPIPRAZOLE 5 MG PO TABS: 5.0000 mg | ORAL_TABLET | Freq: Every day | ORAL | 2 refills | Status: DC

## 2016-11-16 ENCOUNTER — Other Ambulatory Visit: Payer: Self-pay | Admitting: Family Medicine

## 2016-12-30 ENCOUNTER — Encounter: Payer: Self-pay | Admitting: Family Medicine

## 2016-12-30 MED ORDER — MEMANTINE HCL 10 MG PO TABS: 10.0000 mg | ORAL_TABLET | Freq: Two times a day (BID) | ORAL | 3 refills | Status: DC

## 2016-12-30 NOTE — Telephone Encounter (Signed)
Medication called/sent to requested pharmacy  

## 2017-01-04 ENCOUNTER — Emergency Department (HOSPITAL_COMMUNITY)
Admission: EM | Admit: 2017-01-04 | Discharge: 2017-01-04 | Disposition: A | Discharge status: Home / Self Care | Payer: Medicare Other | Attending: Dermatology | Admitting: Dermatology

## 2017-01-04 ENCOUNTER — Encounter (HOSPITAL_COMMUNITY): Payer: Self-pay

## 2017-01-04 ENCOUNTER — Emergency Department (HOSPITAL_COMMUNITY): Payer: Medicare Other

## 2017-01-04 DIAGNOSIS — I10 Essential (primary) hypertension: Secondary | ICD-10-CM | POA: Diagnosis not present

## 2017-01-04 DIAGNOSIS — Z79899 Other long term (current) drug therapy: Secondary | ICD-10-CM | POA: Diagnosis not present

## 2017-01-04 DIAGNOSIS — M542 Cervicalgia: Secondary | ICD-10-CM | POA: Insufficient documentation

## 2017-01-04 DIAGNOSIS — Z5321 Procedure and treatment not carried out due to patient leaving prior to being seen by health care provider: Secondary | ICD-10-CM | POA: Insufficient documentation

## 2017-01-04 DIAGNOSIS — R079 Chest pain, unspecified: Secondary | ICD-10-CM | POA: Insufficient documentation

## 2017-01-04 DIAGNOSIS — G309 Alzheimer's disease, unspecified: Secondary | ICD-10-CM | POA: Diagnosis not present

## 2017-01-04 DIAGNOSIS — Z7982 Long term (current) use of aspirin: Secondary | ICD-10-CM | POA: Insufficient documentation

## 2017-01-04 DIAGNOSIS — M79602 Pain in left arm: Secondary | ICD-10-CM | POA: Diagnosis not present

## 2017-01-04 DIAGNOSIS — R0602 Shortness of breath: Secondary | ICD-10-CM | POA: Diagnosis not present

## 2017-01-04 HISTORY — DX: Alzheimer's disease, unspecified: G30.9

## 2017-01-04 HISTORY — DX: Dementia in other diseases classified elsewhere, unspecified severity, without behavioral disturbance, psychotic disturbance, mood disturbance, and anxiety: F02.80

## 2017-01-04 NOTE — ED Notes (Signed)
Daughter states that she is going to take her mother home at this time and will call her doctor in the morning.  She is afraid that her mother will get the flu if she stays here.  Daughter advised if the patient becomes worse or starts complaining of chest pain, shortness of breath, or any other concerning problems, to call 911 or return here.

## 2017-01-04 NOTE — ED Triage Notes (Signed)
Daughter states that the patient called her when she was on the way home from work stating that she was having chest pain, pain in her left arm, and pain up in her neck.  Daughter picked her up and took her to the house (daughters) checked her blood pressure and called her MD.  Was advised to bring her here.

## 2017-01-05 ENCOUNTER — Encounter: Payer: Self-pay | Admitting: Family Medicine

## 2017-01-27 ENCOUNTER — Encounter: Payer: Self-pay | Admitting: Neurology

## 2017-01-28 ENCOUNTER — Other Ambulatory Visit: Payer: Self-pay | Admitting: Family Medicine

## 2017-01-28 ENCOUNTER — Other Ambulatory Visit: Payer: Self-pay | Admitting: Neurology

## 2017-01-28 MED ORDER — PROPRANOLOL HCL ER 60 MG PO CP24: 60.0000 mg | ORAL_CAPSULE | Freq: Every day | ORAL | 1 refills | Status: DC

## 2017-02-04 ENCOUNTER — Telehealth: Payer: Self-pay | Admitting: Family Medicine

## 2017-02-04 NOTE — Telephone Encounter (Signed)
[name] aware via vm

## 2017-02-04 NOTE — Telephone Encounter (Signed)
Pt was scheduled for good Friday, her daughter called back to reschedule for April 18th which is a Wednesday and Dr. Dennard Schaumann is not here on that day. She wanted to see when she should do her follow up appointment because it is hard for her to get off of work to bring her mom in. I tried explaining to her that it is up to her for when she wanted to reschedule. But she wanted you to find out when he really wants her to come back in.

## 2017-02-04 NOTE — Telephone Encounter (Signed)
Due in the next 1-2 months

## 2017-02-17 ENCOUNTER — Encounter: Payer: Self-pay | Admitting: Family Medicine

## 2017-02-26 ENCOUNTER — Ambulatory Visit: Payer: Medicare Other | Admitting: Family Medicine

## 2017-03-01 ENCOUNTER — Encounter: Payer: Self-pay | Admitting: Family Medicine

## 2017-03-01 ENCOUNTER — Ambulatory Visit (INDEPENDENT_AMBULATORY_CARE_PROVIDER_SITE_OTHER): Payer: Medicare Other | Admitting: Family Medicine

## 2017-03-01 VITALS — BP 140/80 | HR 58 | Temp 98.2°F | Resp 14 | Ht 62.0 in | Wt 122.0 lb

## 2017-03-01 DIAGNOSIS — E7800 Pure hypercholesterolemia, unspecified: Secondary | ICD-10-CM

## 2017-03-01 DIAGNOSIS — F0281 Dementia in other diseases classified elsewhere with behavioral disturbance: Secondary | ICD-10-CM

## 2017-03-01 DIAGNOSIS — G308 Other Alzheimer's disease: Secondary | ICD-10-CM

## 2017-03-01 DIAGNOSIS — G309 Alzheimer's disease, unspecified: Principal | ICD-10-CM

## 2017-03-01 DIAGNOSIS — F320 Major depressive disorder, single episode, mild: Secondary | ICD-10-CM

## 2017-03-01 LAB — CBC WITH DIFFERENTIAL/PLATELET
BASOS ABS: 0 {cells}/uL (ref 0–200)
Basophils Relative: 0 %
EOS ABS: 202 {cells}/uL (ref 15–500)
Eosinophils Relative: 2 %
HEMATOCRIT: 38.6 % (ref 35.0–45.0)
HEMOGLOBIN: 12.4 g/dL (ref 12.0–15.0)
LYMPHS ABS: 1717 {cells}/uL (ref 850–3900)
Lymphocytes Relative: 17 %
MCH: 32.4 pg (ref 27.0–33.0)
MCHC: 32.1 g/dL (ref 32.0–36.0)
MCV: 100.8 fL — AB (ref 80.0–100.0)
MONO ABS: 606 {cells}/uL (ref 200–950)
MPV: 10.9 fL (ref 7.5–12.5)
Monocytes Relative: 6 %
NEUTROS ABS: 7575 {cells}/uL (ref 1500–7800)
Neutrophils Relative %: 75 %
Platelets: 240 10*3/uL (ref 140–400)
RBC: 3.83 MIL/uL (ref 3.80–5.10)
RDW: 13.7 % (ref 11.0–15.0)
WBC: 10.1 10*3/uL (ref 3.8–10.8)

## 2017-03-01 LAB — COMPLETE METABOLIC PANEL WITH GFR
ALBUMIN: 3.8 g/dL (ref 3.6–5.1)
ALK PHOS: 75 U/L (ref 33–130)
ALT: 12 U/L (ref 6–29)
AST: 18 U/L (ref 10–35)
BILIRUBIN TOTAL: 0.5 mg/dL (ref 0.2–1.2)
BUN: 12 mg/dL (ref 7–25)
CO2: 29 mmol/L (ref 20–31)
Calcium: 9.1 mg/dL (ref 8.6–10.4)
Chloride: 105 mmol/L (ref 98–110)
Creat: 0.9 mg/dL (ref 0.60–0.93)
GFR, EST NON AFRICAN AMERICAN: 63 mL/min (ref >=60)
GFR, Est African American: 73 mL/min (ref >=60)
GLUCOSE: 87 mg/dL (ref 70–99)
Potassium: 4.4 mmol/L (ref 3.5–5.3)
SODIUM: 142 mmol/L (ref 135–146)
TOTAL PROTEIN: 6.3 g/dL (ref 6.1–8.1)

## 2017-03-01 LAB — LIPID PANEL
Cholesterol: 158 mg/dL (ref <200)
HDL: 84 mg/dL (ref >50)
LDL Cholesterol: 52 mg/dL (ref <100)
Total CHOL/HDL Ratio: 1.9 Ratio (ref <5.0)
Triglycerides: 108 mg/dL (ref <150)
VLDL: 22 mg/dL (ref <30)
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
## 2017-03-17 ENCOUNTER — Ambulatory Visit (INDEPENDENT_AMBULATORY_CARE_PROVIDER_SITE_OTHER): Payer: Medicare Other | Admitting: Adult Health

## 2017-03-17 ENCOUNTER — Encounter: Payer: Self-pay | Admitting: Adult Health

## 2017-03-17 VITALS — BP 139/58 | HR 55 | Resp 20 | Ht 61.0 in | Wt 124.0 lb

## 2017-03-17 DIAGNOSIS — R413 Other amnesia: Secondary | ICD-10-CM | POA: Diagnosis not present

## 2017-03-17 NOTE — Progress Notes (Signed)
I have read the note, and I agree with the clinical assessment and plan.  Theresa Sloan,Theresa Sloan   

## 2017-03-17 NOTE — Progress Notes (Signed)
PATIENT: Theresa Sloan DOB: [birth year]  REASON FOR VISIT: follow up- memory disturbance HISTORY FROM: patient and daughter  HISTORY OF PRESENT ILLNESS: Theresa Sloan  of 75 is a 76 year old female with a history of memory disturbance. She returns today for follow-up. She continues on Namenda and the Exelon patch. She feels that her memory has remained stable. Her daughter is with her today and has noticed more agitation in the last couple weeks. She reports that the agitation is controllable. She lives at home however her grandson lives with her. She is able to complete all ADLs independently. She does not operate a motor vehicle. Denies any trouble sleeping. Denies hallucinations. Denies any trouble with her appetite. Her daughter does her to get her to do word searches and coloring. She reports that her mother is very socially active as well. She returns today for an evaluation.  HISTORY 09/11/16: Theresa Sloan is a 76 year old right-handed white female with a history of a memory disturbance. 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 The patient has been on Namenda and the low-dose Exelon patch. She is tolerating the patch well. She has not noted any significant change in her cognitive status since last seen, her family agrees with this. The patient does not operate a motor vehicle. She returns for an evaluation. The patient has an essential tremor, but she apparently does not have much disability associated with the tremor.  REVIEW OF SYSTEMS: Out of a complete 14 system review of symptoms, the patient complains only of the following symptoms, and all other reviewed systems are negative.  Memory loss, tremors, neck pain, neck stiffness, snoring, sleep talking  ALLERGIES: No Known Allergies  HOME MEDICATIONS: Outpatient Medications Prior to Visit  Medication Sig  Dispense Refill  . ARIPiprazole (ABILIFY) 5 MG tablet TAKE 1 TABLET BY MOUTH DAILY 30 tablet 3  . aspirin 81 MG tablet Take 81 mg by mouth daily. Reported on 02/03/2016    . atorvastatin (LIPITOR) 80 MG tablet Take 1 tablet (80 mg total) by mouth daily with breakfast. 90 tablet 1  . escitalopram (LEXAPRO) 20 MG tablet TAKE ONE TABLET BY MOUTH DAILY. 30 tablet 5  . memantine (NAMENDA) 10 MG tablet Take 1 tablet (10 mg total) by mouth 2 (two) times daily. 90 tablet 3  . Multiple Vitamins-Minerals (PRESERVISION AREDS 2 PO) Take 2 capsules by mouth daily.    Marland Kitchen omeprazole (PRILOSEC) 20 MG capsule Take 1 capsule (20 mg total) by mouth daily. 90 capsule 4  . propranolol ER (INDERAL LA) 60 MG 24 hr capsule Take 1 capsule (60 mg total) by mouth daily. 90 capsule 1  . rivastigmine (EXELON) 9.5 mg/24hr Place 1 patch (9.5 mg total) onto the skin daily. 30 patch 5   No facility-administered medications prior to visit.     PAST MEDICAL HISTORY: Past Medical History:  Diagnosis Date  . Alzheimer disease   . Anxiety   . Dementia   . 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 Gait difficulty 03/05/2015  . GERD (gastroesophageal reflux disease)   . Hyperlipidemia   . Hypertension   . Memory difficulty 08/07/2014  . TMJ (dislocation of temporomandibular joint)     PAST SURGICAL HISTORY: Past Surgical History:  Procedure Laterality Date  . CESAREAN SECTION  1992  . RETINAL TEAR REPAIR CRYOTHERAPY      FAMILY HISTORY: Family  History  Problem Relation Age of Onset  . Hypertension Sister   . Tremor Sister     SOCIAL HISTORY: Social History   Social History  . Marital status: Widowed    Spouse name: N/A  . Number of children: 2  . Years of education: 74 th   Occupational History  . retired    Social History Main Topics  . Smoking status: Never Smoker  . Smokeless tobacco: Never Used  . Alcohol use No  . Drug use: No  . Sexual activity: Not on file   Other Topics Concern   . Not on file   Social History Narrative   Patient is right handed.   Patient does not drink caffeine.      PHYSICAL EXAM  Vitals:   03/17/17 0725  BP: (!) 139/58  Pulse: (!) 55  Resp: 20  Weight: 124 lb (56.2 kg)  Height: 5\' 1"  (1.549 m)   Body mass index is 23.43 kg/m.  MMSE - Mini Mental State Exam 03/17/2017 09/11/2016 02/03/2016  Orientation to time 1 1 1   Orientation to Place 2 4 4   Registration 3 3 3   Attention/ Calculation 5 5 5   Recall 0 1 0  Language- name 2 objects 2 2 2   Language- repeat 0 1 1  Language- follow 3 step command 3 3 3   Language- read & follow direction 1 1 1   Write a sentence 0 1 1  Copy design 0 1 0  Total score 17 23 21      Generalized: Well developed, in no acute distress   Neurological examination  Mentation: Alert. Follows all commands speech and language fluent Cranial nerve II-XII: Pupils were equal round reactive to light. Extraocular movements were full, visual field were full on confrontational test. Facial sensation and strength were normal. Uvula tongue midline. Head turning and shoulder shrug  were normal and symmetric. Motor: The motor testing reveals 5 over 5 strength of all 4 extremities. Good symmetric motor tone is noted throughout.  Sensory: Sensory testing is intact to soft touch on all 4 extremities. No evidence of extinction is noted.  Coordination: Cerebellar testing reveals good finger-nose-finger and heel-to-shin bilaterally.  Gait and station: Gait is normal.  Reflexes: Deep tendon reflexes are symmetric and normal bilaterally.   DIAGNOSTIC DATA (LABS, IMAGING, TESTING) - I reviewed patient records, labs, notes, testing and imaging myself where available.  Lab Results  Component Value Date   WBC 10.1 03/01/2017   HGB 12.4 03/01/2017   HCT 38.6 03/01/2017   MCV 100.8 (H) 03/01/2017   PLT 240 03/01/2017      Component Value Date/Time   NA 142 03/01/2017 0830   NA 139 02/03/2016 1508   K 4.4 03/01/2017 0830    CL 105 03/01/2017 0830   CO2 29 03/01/2017 0830   GLUCOSE 87 03/01/2017 0830   BUN 12 03/01/2017 0830   BUN 9 02/03/2016 1508   CREATININE 0.90 03/01/2017 0830   CALCIUM 9.1 03/01/2017 0830   PROT 6.3 03/01/2017 0830   PROT 6.6 02/03/2016 1508   ALBUMIN 3.8 03/01/2017 0830   ALBUMIN 4.0 02/03/2016 1508   AST 18 03/01/2017 0830   ALT 12 03/01/2017 0830   ALKPHOS 75 03/01/2017 0830   BILITOT 0.5 03/01/2017 0830   BILITOT 0.3 02/03/2016 1508   GFRNONAA 63 03/01/2017 0830   GFRAA 73 03/01/2017 0830   Lab Results  Component Value Date   CHOL 158 03/01/2017   HDL 84 03/01/2017   LDLCALC 52  03/01/2017   TRIG 108 03/01/2017   CHOLHDL 1.9 03/01/2017   No results found for: HGBA1C Lab Results  Component Value Date   OECXFQHK25 750 02/03/2016   Lab Results  Component Value Date   TSH 6.750 (H) 02/03/2016      ASSESSMENT AND PLAN 76 y.o. year old female  has a past medical history of Alzheimer disease; Anxiety; Dementia; Essential and other specified forms of tremor (08/07/2014); Gait difficulty (03/05/2015); GERD (gastroesophageal reflux disease); Hyperlipidemia; Hypertension; Memory difficulty (08/07/2014); and TMJ (dislocation of temporomandibular joint). here with :  1. Memory disturbance  His memory score has declined slightly. MMSE today is 17 out of 30 was previously 23 out of 30. She will continue on Namenda and the Exelon patch. I have advised the daughter is they notice any additional changes in her memory or new symptoms they should let us know. She will follow-up in 6 months or sooner if needed.  I spent 15 minutes with the patient and her daughter. 50% of this time was spent discussing her diagnosis.    Ward Givens, MSN, NP-C 03/17/2017, 7:31 AM [facility] [address] [place], [place] [address] [phone]

## 2017-03-17 NOTE — Patient Instructions (Signed)
Continue Namenda and Exellon patch Memory score slightly declined Tremor is stable- continue propranolol  If your symptoms worsen or you develop new symptoms please let us know.

## 2017-03-18 ENCOUNTER — Other Ambulatory Visit: Payer: Self-pay | Admitting: Neurology

## 2017-04-02 ENCOUNTER — Encounter: Payer: Self-pay | Admitting: Family Medicine

## 2017-04-02 ENCOUNTER — Ambulatory Visit (INDEPENDENT_AMBULATORY_CARE_PROVIDER_SITE_OTHER): Payer: Medicare Other | Admitting: Family Medicine

## 2017-04-02 VITALS — BP 110/70 | HR 60 | Temp 98.1°F | Resp 14 | Ht 62.0 in | Wt 120.0 lb

## 2017-04-02 DIAGNOSIS — B351 Tinea unguium: Secondary | ICD-10-CM

## 2017-04-02 NOTE — Progress Notes (Signed)
Subjective:    Patient ID: Theresa Sloan, female    DOB: Jun 24, 1941, 76 y.o.   MRN: [id number]  HPI On the patient's right foot, the distal portion of her right great toenail has become thick and yellow in places as well as dystrophic appearing to develop onychomycosis. She has similar changes on her third and fourth toenails although these are difficult to see today because they're covered in the nail polish. However on her left foot, no such changes are present Past Medical History:  Diagnosis Date  . Alzheimer disease   . Anxiety   . Dementia   . 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 Gait difficulty 03/05/2015  . GERD (gastroesophageal reflux disease)   . Hyperlipidemia   . Hypertension   . Memory difficulty 08/07/2014  . TMJ (dislocation of temporomandibular joint)    Past Surgical History:  Procedure Laterality Date  . CESAREAN SECTION  1992  . RETINAL TEAR REPAIR CRYOTHERAPY     Current Outpatient Prescriptions on File Prior to Visit  Medication Sig Dispense Refill  . ARIPiprazole (ABILIFY) 5 MG tablet TAKE 1 TABLET BY MOUTH DAILY 30 tablet 3  . aspirin 81 MG tablet Take 81 mg by mouth daily. Reported on 02/03/2016    . atorvastatin (LIPITOR) 80 MG tablet Take 1 tablet (80 mg total) by mouth daily with breakfast. 90 tablet 1  . escitalopram (LEXAPRO) 20 MG tablet TAKE ONE TABLET BY MOUTH DAILY. 30 tablet 5  . memantine (NAMENDA) 10 MG tablet Take 1 tablet (10 mg total) by mouth 2 (two) times daily. 90 tablet 3  . Multiple Vitamins-Minerals (PRESERVISION AREDS 2 PO) Take 2 capsules by mouth daily.    Marland Kitchen omeprazole (PRILOSEC) 20 MG capsule Take 1 capsule (20 mg total) by mouth daily. 90 capsule 4  . propranolol ER (INDERAL LA) 60 MG 24 hr capsule Take 1 capsule (60 mg total) by mouth daily. 90 capsule 1  . rivastigmine (EXELON) 9.5 mg/24hr APPLY 1 PATCH TO THE SKIN DAILY 30 patch 5   No current facility-administered medications on file prior to visit.     No Known Allergies Social History   Social History  . Marital status: Widowed    Spouse name: N/A  . Number of children: 2  . Years of education: 76 th   Occupational History  . retired    Social History Main Topics  . Smoking status: Never Smoker  . Smokeless tobacco: Never Used  . Alcohol use No  . Drug use: No  . Sexual activity: Not on file   Other Topics Concern  . Not on file   Social History Narrative   Patient is right handed.   Patient does not drink caffeine.      Review of Systems  All other systems reviewed and are negative.      Objective:   Physical Exam  Cardiovascular: Normal rate, regular rhythm and normal heart sounds.   Pulmonary/Chest: Effort normal and breath sounds normal.  Skin: Rash noted.  Vitals reviewed. right 1, 3, 4 toenails are thickened, dystrophic, and yellow.        Assessment & Plan:  Onychomycosis due to dermatophyte  I explained the patient that the only way to successfully treat this would be to use Lamisil 250 mg by mouth daily. However she would have to use medication for 3 months. I also explained that we will have to check her liver function tests every month while taking the medication.  She would also need to discontinue Lipitor while taking the medication to avoid potential liver toxicity. After discussing the risk and benefits of treatment versus nontreatment, the patient elects not to treat the toenail fungus at the present time

## 2017-04-07 ENCOUNTER — Encounter: Payer: Self-pay | Admitting: Family Medicine

## 2017-04-08 MED ORDER — VALACYCLOVIR HCL 1 G PO TABS: 1000.0000 mg | ORAL_TABLET | Freq: Three times a day (TID) | ORAL | 0 refills | Status: DC

## 2017-04-19 ENCOUNTER — Other Ambulatory Visit: Payer: Self-pay | Admitting: Family Medicine

## 2017-05-18 ENCOUNTER — Encounter: Payer: Self-pay | Admitting: Neurology

## 2017-05-19 ENCOUNTER — Other Ambulatory Visit: Payer: Self-pay | Admitting: Neurology

## 2017-05-19 MED ORDER — ESCITALOPRAM OXALATE 20 MG PO TABS: 20.0000 mg | ORAL_TABLET | Freq: Every day | ORAL | 3 refills | Status: DC

## 2017-05-21 ENCOUNTER — Telehealth: Payer: Self-pay | Admitting: *Deleted

## 2017-05-21 ENCOUNTER — Other Ambulatory Visit: Payer: Self-pay | Admitting: Neurology

## 2017-05-21 NOTE — Telephone Encounter (Signed)
Called and spoke with Verdis Frederickson at [facility]. Asked her to cx rx lexapro sent on 05/19/17. Sent to [facility] instead. She verbalized understanding and cx.

## 2017-05-24 ENCOUNTER — Encounter: Payer: Self-pay | Admitting: Family Medicine

## 2017-05-24 ENCOUNTER — Ambulatory Visit (INDEPENDENT_AMBULATORY_CARE_PROVIDER_SITE_OTHER): Payer: Medicare Other | Admitting: Family Medicine

## 2017-05-24 ENCOUNTER — Telehealth: Payer: Self-pay | Admitting: Neurology

## 2017-05-24 VITALS — BP 108/62 | HR 50 | Temp 97.5°F | Resp 16 | Ht 62.0 in | Wt 121.0 lb

## 2017-05-24 DIAGNOSIS — N390 Urinary tract infection, site not specified: Secondary | ICD-10-CM

## 2017-05-24 DIAGNOSIS — F0391 Unspecified dementia with behavioral disturbance: Secondary | ICD-10-CM | POA: Diagnosis not present

## 2017-05-24 DIAGNOSIS — R410 Disorientation, unspecified: Secondary | ICD-10-CM | POA: Diagnosis not present

## 2017-05-24 LAB — URINALYSIS, MICROSCOPIC ONLY
BACTERIA UA: NONE SEEN [HPF]
CASTS: NONE SEEN [LPF]
Crystals: NONE SEEN [HPF]
RBC / HPF: NONE SEEN RBC/HPF (ref <=2)
Yeast: NONE SEEN [HPF]

## 2017-05-24 LAB — URINALYSIS, ROUTINE W REFLEX MICROSCOPIC
BILIRUBIN URINE: NEGATIVE
GLUCOSE, UA: NEGATIVE
HGB URINE DIPSTICK: NEGATIVE
Ketones, ur: NEGATIVE
Nitrite: NEGATIVE
PH: 5.5 (ref 5.0–8.0)
PROTEIN: NEGATIVE
Specific Gravity, Urine: 1.02 (ref 1.001–1.035)

## 2017-05-24 MED ORDER — CIPROFLOXACIN HCL 250 MG PO TABS: 250.0000 mg | ORAL_TABLET | Freq: Two times a day (BID) | ORAL | 0 refills | Status: DC

## 2017-05-24 MED ORDER — NITROFURANTOIN MONOHYD MACRO 100 MG PO CAPS: 100.0000 mg | ORAL_CAPSULE | Freq: Two times a day (BID) | ORAL | 0 refills | Status: DC

## 2017-05-24 MED ORDER — ARIPIPRAZOLE 5 MG PO TABS: 5.0000 mg | ORAL_TABLET | Freq: Every day | ORAL | 3 refills | Status: DC

## 2017-05-24 NOTE — Telephone Encounter (Signed)
I called the daughter. The patient had some confusion and agitation over the weekend, she went to her primary care doctor today, she has a bladder infection and will be going on antibiotic therapy.

## 2017-05-24 NOTE — Progress Notes (Signed)
   Subjective:    Patient ID: Theresa Sloan, female    DOB: [birth year], 76 y.o.   MRN: [id number]  Patient presents for Altered Mental Status Patient here today with her daughter. She has underlying dementia as well as benign essential tremor. She's not had any new medications. Yesterday she began having episodes where she would stare off and just did not seem like herself. She didn't became aggressive that evening was cursing and trying to push people come into the door to the home. Her daughter was concerned for possible UTI she did have some trouble urinating yesterday where she would just dribble when she went to the bathroom. She has not had any fever no cough or congestion no rash. Her bowels at baseline. She is still eating at her baseline. She states that she does not drink enough fluids even though they tried to encourage her. Today she's not had any aggressive behavior or any of the staring spells.  She only states her head hurt some    Review Of Systems: UNABLE TO OBTAIN FROM PATIENT- Dementia per above daughter  GEN- denies fatigue, fever, weight loss,weakness, recent illness HEENT- denies eye drainage, change in vision, nasal discharge, CVS- denies chest pain, palpitations RESP- denies SOB, cough, wheeze ABD- denies N/V, change in stools, abd pain GU- denies dysuria, hematuria, dribbling, incontinence MSK- denies joint pain, muscle aches, injury Neuro- denies headache, dizziness, syncope, seizure activity       Objective:    BP 108/62   Pulse (!) 50   Temp 97.5 F (36.4 C) (Oral)   Resp 16   Ht 5\' 2"  (1.575 m)   Wt 121 lb (54.9 kg)   SpO2 96%   BMI 22.13 kg/m  GEN- NAD, alert and oriented x2-baseline per daughter HEENT- PERRL, EOMI, non injected sclera, pink conjunctiva, MMM, oropharynx clear Neck- Supple, no LAD  CVS- RRR, no murmur RESP-CTAB ABD-NABS,soft,NT,ND, no suprapubtic tenderness, no CVA tenderness  Neuro- CNII-XII in tact, tremor bilat UE EXT- No  edema Pulses- Radial  2+        Assessment & Plan:      Problem List Items Addressed This Visit    Dementia   Relevant Medications   ARIPiprazole (ABILIFY) 5 MG tablet    Other Visit Diagnoses    Confusion    -  Primary   Relevant Orders   Urinalysis, Routine w reflex microscopic (Completed)   Urine Culture   Acute UTI       Concern for UTI based on acuity of symptoms, though back at baseline today. Exam benign otherwise. Difficult to discern if this is also worsening of her dementia in general.  She will start Cipro culture sent, keep hydrated, call for any changes       Note: This dictation was prepared with Dragon dictation along with smaller phrase technology. Any transcriptional errors that result from this process are unintentional.

## 2017-05-24 NOTE — Addendum Note (Signed)
Addended by: Vic Blackbird F on: 05/24/2017 05:16 PM   Modules accepted: Orders

## 2017-05-24 NOTE — Telephone Encounter (Signed)
Pt daughter calling re: behavior of pt this weekend, she was very combative and is not urinating as she should,. Pt daughter was advised to take pt to ED if she feels she is in need, she is asking for a call back due to the state of pt at this time.

## 2017-05-26 LAB — URINE CULTURE: Organism ID, Bacteria: NO GROWTH

## 2017-06-03 ENCOUNTER — Encounter: Payer: Self-pay | Admitting: Family Medicine

## 2017-06-04 ENCOUNTER — Other Ambulatory Visit: Payer: Self-pay | Admitting: Family Medicine

## 2017-06-04 MED ORDER — ARIPIPRAZOLE 5 MG PO TABS: 5.0000 mg | ORAL_TABLET | Freq: Every day | ORAL | 3 refills | Status: DC

## 2017-06-15 ENCOUNTER — Encounter: Payer: Self-pay | Admitting: Adult Health

## 2017-06-17 ENCOUNTER — Ambulatory Visit (INDEPENDENT_AMBULATORY_CARE_PROVIDER_SITE_OTHER): Payer: Medicare Other | Admitting: Adult Health

## 2017-06-17 ENCOUNTER — Encounter: Payer: Self-pay | Admitting: Adult Health

## 2017-06-17 VITALS — BP 135/65 | HR 54 | Ht 62.0 in | Wt 120.8 lb

## 2017-06-17 DIAGNOSIS — F0390 Unspecified dementia without behavioral disturbance: Secondary | ICD-10-CM

## 2017-06-17 NOTE — Progress Notes (Signed)
PATIENT: Theresa Sloan DOB: 12-Jun-1941  REASON FOR VISIT: follow up- memory HISTORY FROM: patient and daughter  HISTORY OF PRESENT ILLNESS: Theresa Sloan is a 76 year old female with a history of memory disturbance. She returns today for follow-up. She is currently on Namenda and Exelon and tolerating both medications well. Her daughter is with her today. Daughter states that the patient is now living with her. She reports that this moved took place last Friday. She states since then she's noticed an increase in confusion. She has done odd things like sticking her hand in the toilet bowl. She is able to complete all ADLs independently. She does not operate a motor vehicle. Her daughter handles all of her finances. Patient denies any trouble sleeping. In June she was diagnosed with a bladder infection and was on antibiotics. Daughter states that once she completed the antibiotic her confusion improved. She returns today for an evaluation.  HISTORY Theresa Sloan  of 40 is a 76 year old female with a history of memory disturbance. She returns today for follow-up. She continues on Namenda and the Exelon patch. She feels that her memory has remained stable. Her daughter is with her today and has noticed more agitation in the last couple weeks. She reports that the agitation is controllable. She lives at home however her grandson lives with her. She is able to complete all ADLs independently. She does not operate a motor vehicle. Denies any trouble sleeping. Denies hallucinations. Denies any trouble with her appetite. Her daughter does her to get her to do word searches and coloring. She reports that her mother is very socially active as well. She returns today for an evaluation.  HISTORY 09/11/16: Theresa Sloan a 76 year old right-handed white female with a history of a memory disturbance. MRI of the brain has been done and shows evidence of minimal small vessel disease, the patient does have mesial  temporal atrophy consistent with Alzheimer'sdisease. The patient has been on Namenda and the low-dose Exelon patch. She is tolerating the patch well. She has not noted any significant change in her cognitive status since last seen, her family agrees with this. The patient does not operate a motor vehicle. She returns for an evaluation. The patient has an essential tremor, but she apparently does not have much disability associated with the tremor.   REVIEW OF SYSTEMS: Out of a complete 14 system review of symptoms, the patient complains only of the following symptoms, and all other reviewed systems are negative.  Tremors, agitation, confusion, anxious, memory loss, sleep talking  ALLERGIES: No Known Allergies  HOME MEDICATIONS: Outpatient Medications Prior to Visit  Medication Sig Dispense Refill  . ARIPiprazole (ABILIFY) 5 MG tablet TAKE ONE (1) TABLET EACH DAY 30 tablet 5  . atorvastatin (LIPITOR) 80 MG tablet TAKE ONE (1) TABLET BY MOUTH EVERY DAY WITH BREAKFAST 90 tablet 1  . escitalopram (LEXAPRO) 20 MG tablet TAKE ONE (1) TABLET BY MOUTH EVERY DAY 90 tablet 3  . memantine (NAMENDA) 10 MG tablet Take 1 tablet (10 mg total) by mouth 2 (two) times daily. 90 tablet 3  . Multiple Vitamins-Minerals (PRESERVISION AREDS 2 PO) Take 2 capsules by mouth daily.    Marland Kitchen omeprazole (PRILOSEC) 20 MG capsule Take 1 capsule (20 mg total) by mouth daily. 90 capsule 4  . propranolol ER (INDERAL LA) 60 MG 24 hr capsule Take 1 capsule (60 mg total) by mouth daily. 90 capsule 1  . rivastigmine (EXELON) 9.5 mg/24hr APPLY 1 PATCH TO THE SKIN DAILY  30 patch 5  . valACYclovir (VALTREX) 1000 MG tablet Take 1 tablet (1,000 mg total) by mouth 3 (three) times daily. (Patient taking differently: Take 1,000 mg by mouth 3 (three) times daily. Takes prn for shingles) 21 tablet 0  . ARIPiprazole (ABILIFY) 5 MG tablet Take 1 tablet (5 mg total) by mouth daily. 30 tablet 3  . nitrofurantoin, macrocrystal-monohydrate,  (MACROBID) 100 MG capsule Take 1 capsule (100 mg total) by mouth 2 (two) times daily. 10 capsule 0   No facility-administered medications prior to visit.     PAST MEDICAL HISTORY: Past Medical History:  Diagnosis Date  . Alzheimer disease   . Anxiety   . Dementia   . 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 Gait difficulty 03/05/2015  . GERD (gastroesophageal reflux disease)   . Hyperlipidemia   . Hypertension   . Memory difficulty 08/07/2014  . TMJ (dislocation of temporomandibular joint)     PAST SURGICAL HISTORY: Past Surgical History:  Procedure Laterality Date  . CESAREAN SECTION  1992  . RETINAL TEAR REPAIR CRYOTHERAPY      FAMILY HISTORY: Family History  Problem Relation Age of Onset  . Hypertension Sister   . Tremor Sister     SOCIAL HISTORY: Social History   Social History  . Marital status: Widowed    Spouse name: N/A  . Number of children: 2  . Years of education: 67 th   Occupational History  . retired    Social History Main Topics  . Smoking status: Never Smoker  . Smokeless tobacco: Never Used  . Alcohol use No  . Drug use: No  . Sexual activity: Not on file   Other Topics Concern  . Not on file   Social History Narrative   Patient is right handed.   Patient does not drink caffeine.      PHYSICAL EXAM  Vitals:   06/17/17 1309  BP: 135/65  Pulse: (!) 54  Weight: 120 lb 12.8 oz (54.8 kg)  Height: 5\' 2"  (1.575 m)   Body mass index is 22.09 kg/m.   MMSE - Mini Mental State Exam 06/17/2017 03/17/2017 09/11/2016  Orientation to time 0 1 1  Orientation to Place 3 2 4   Registration 3 3 3   Attention/ Calculation 5 5 5   Recall 0 0 1  Language- name 2 objects 2 2 2   Language- repeat 0 0 1  Language- follow 3 step command 2 3 3   Language- read & follow direction 1 1 1   Write a sentence 1 0 1  Copy design 0 0 1  Total score 17 17 23      Generalized: Well developed, in no acute distress   Neurological examination    Mentation: Alert oriented to time, place, history taking. Follows all commands speech and language fluent Cranial nerve II-XII: Pupils were equal round reactive to light. Extraocular movements were full, visual field were full on confrontational test. Facial sensation and strength were normal. Uvula tongue midline. Head turning and shoulder shrug  were normal and symmetric. Motor: The motor testing reveals 5 over 5 strength of all 4 extremities. Good symmetric motor tone is noted throughout.  Sensory: Sensory testing is intact to soft touch on all 4 extremities. No evidence of extinction is noted.  Coordination: Cerebellar testing reveals good finger-nose-finger and heel-to-shin bilaterally.  Gait and station: some assistance with ambulating- holding on to daughter Reflexes: Deep tendon reflexes are symmetric and normal bilaterally.   DIAGNOSTIC DATA (LABS, IMAGING,  TESTING) - I reviewed patient records, labs, notes, testing and imaging myself where available.  Lab Results  Component Value Date   WBC 10.1 03/01/2017   HGB 12.4 03/01/2017   HCT 38.6 03/01/2017   MCV 100.8 (H) 03/01/2017   PLT 240 03/01/2017      Component Value Date/Time   NA 142 03/01/2017 0830   NA 139 02/03/2016 1508   K 4.4 03/01/2017 0830   CL 105 03/01/2017 0830   CO2 29 03/01/2017 0830   GLUCOSE 87 03/01/2017 0830   BUN 12 03/01/2017 0830   BUN 9 02/03/2016 1508   CREATININE 0.90 03/01/2017 0830   CALCIUM 9.1 03/01/2017 0830   PROT 6.3 03/01/2017 0830   PROT 6.6 02/03/2016 1508   ALBUMIN 3.8 03/01/2017 0830   ALBUMIN 4.0 02/03/2016 1508   AST 18 03/01/2017 0830   ALT 12 03/01/2017 0830   ALKPHOS 75 03/01/2017 0830   BILITOT 0.5 03/01/2017 0830   BILITOT 0.3 02/03/2016 1508   GFRNONAA 63 03/01/2017 0830   GFRAA 73 03/01/2017 0830   Lab Results  Component Value Date   CHOL 158 03/01/2017   HDL 84 03/01/2017   LDLCALC 52 03/01/2017   TRIG 108 03/01/2017   CHOLHDL 1.9 03/01/2017   No results  found for: HGBA1C Lab Results  Component Value Date   MEQASTMH96 222 02/03/2016   Lab Results  Component Value Date   TSH 6.750 (H) 02/03/2016      ASSESSMENT AND PLAN 76 y.o. year old female  has a past medical history of Alzheimer disease; Anxiety; Dementia; Essential and other specified forms of tremor (08/07/2014); Gait difficulty (03/05/2015); GERD (gastroesophageal reflux disease); Hyperlipidemia; Hypertension; Memory difficulty (08/07/2014); and TMJ (dislocation of temporomandibular joint). here with:  1. Memory disturbance  The patient's memory score has remained stable. We will continue to monitor her memory. She will remain on Aricept and Namenda. I advised that her increasing confusion within the last week is most likely due to her recent move. Patient and her daughter voiced understanding. I advised that if her symptoms worsen or she develops new symptoms she should let us know. She will follow-up in 6 months or sooner if needed.  I spent 15 minutes with the patient. 50% of this time was spent discussing her memory score and symptoms.    Ward Givens, MSN, NP-C 06/17/2017, 2:09 PM [facility] [address] [place], [place] [address] [phone]

## 2017-06-17 NOTE — Progress Notes (Signed)
I have read the note, and I agree with the clinical assessment and plan.  Theresa Sloan,Theresa Sloan   

## 2017-06-17 NOTE — Patient Instructions (Signed)
Your Plan:  Continue namenda and Exelon patch Memory score is stable If your symptoms worsen or you develop new symptoms please let us know.    Thank you for coming to see [place] at [facility]. I hope we have been able to provide you high quality care today.  You may receive a patient satisfaction survey over the next few weeks. We would appreciate your feedback and comments so that we may continue to improve ourselves and the health of our patients.

## 2017-06-29 ENCOUNTER — Encounter: Payer: Self-pay | Admitting: Adult Health

## 2017-06-29 ENCOUNTER — Other Ambulatory Visit: Payer: Self-pay | Admitting: Family Medicine

## 2017-06-30 ENCOUNTER — Telehealth: Payer: Self-pay | Admitting: Adult Health

## 2017-06-30 ENCOUNTER — Ambulatory Visit (INDEPENDENT_AMBULATORY_CARE_PROVIDER_SITE_OTHER): Payer: Medicare Other | Admitting: Physician Assistant

## 2017-06-30 ENCOUNTER — Encounter: Payer: Self-pay | Admitting: Physician Assistant

## 2017-06-30 VITALS — BP 140/86 | HR 63 | Temp 97.9°F | Resp 14 | Wt 120.4 lb

## 2017-06-30 DIAGNOSIS — F0391 Unspecified dementia with behavioral disturbance: Secondary | ICD-10-CM | POA: Diagnosis not present

## 2017-06-30 DIAGNOSIS — F05 Delirium due to known physiological condition: Secondary | ICD-10-CM

## 2017-06-30 DIAGNOSIS — R3989 Other symptoms and signs involving the genitourinary system: Secondary | ICD-10-CM

## 2017-06-30 DIAGNOSIS — R410 Disorientation, unspecified: Secondary | ICD-10-CM

## 2017-06-30 LAB — CBC WITH DIFFERENTIAL/PLATELET
BASOS ABS: 0 {cells}/uL (ref 0–200)
Basophils Relative: 0 %
Eosinophils Absolute: 97 cells/uL (ref 15–500)
Eosinophils Relative: 1 %
HEMATOCRIT: 39 % (ref 35.0–45.0)
HEMOGLOBIN: 12.8 g/dL (ref 12.0–15.0)
LYMPHS ABS: 1843 {cells}/uL (ref 850–3900)
LYMPHS PCT: 19 %
MCH: 33.2 pg — AB (ref 27.0–33.0)
MCHC: 32.8 g/dL (ref 32.0–36.0)
MCV: 101 fL — ABNORMAL HIGH (ref 80.0–100.0)
MONO ABS: 485 {cells}/uL (ref 200–950)
MPV: 10.8 fL (ref 7.5–12.5)
Monocytes Relative: 5 %
NEUTROS PCT: 75 %
Neutro Abs: 7275 cells/uL (ref 1500–7800)
Platelets: 307 10*3/uL (ref 140–400)
RBC: 3.86 MIL/uL (ref 3.80–5.10)
RDW: 13.4 % (ref 11.0–15.0)
WBC: 9.7 10*3/uL (ref 3.8–10.8)

## 2017-06-30 LAB — URINALYSIS, ROUTINE W REFLEX MICROSCOPIC
BILIRUBIN URINE: NEGATIVE
GLUCOSE, UA: NEGATIVE
Hgb urine dipstick: NEGATIVE
KETONES UR: NEGATIVE
Leukocytes, UA: NEGATIVE
Nitrite: NEGATIVE
PROTEIN: NEGATIVE
SPECIFIC GRAVITY, URINE: 1.025 (ref 1.001–1.035)
pH: 5.5 (ref 5.0–8.0)

## 2017-06-30 MED ORDER — LORAZEPAM 0.5 MG PO TABS: 0.5000 mg | ORAL_TABLET | Freq: Three times a day (TID) | ORAL | 3 refills | Status: DC | PRN

## 2017-06-30 MED ORDER — ARIPIPRAZOLE 10 MG PO TABS: 10.0000 mg | ORAL_TABLET | Freq: Every day | ORAL | 3 refills | Status: DC

## 2017-06-30 NOTE — Addendum Note (Signed)
Addended by: Kathrynn Ducking on: 06/30/2017 05:48 PM   Modules accepted: Orders

## 2017-06-30 NOTE — Progress Notes (Signed)
Patient ID: Theresa Sloan MRN: [id number], DOB: July 01, 1941, 76 y.o. Date of Encounter: 06/30/2017, 11:49 AM    Chief Complaint:  Chief Complaint  Patient presents with  . not sleeping  . abnormal urine odor  . erratic behavior     HPI: 76 y.o. year old female presents for eval of above.   Her daughter accompanies her for office visit and provides the information. Daughter reports that she quit work July 13 and is now at home providing care to patient. Daughter reports that for the past 2 days patient has been combative, cursing like a sailor, up at night. Says that she put a phone call into [facility] neurology but has not heard back from them. Says that in the past when she acted like this it was because she had a urinary tract infection so wanted to come in and check that. Says that she has been trying to get patient to wipe front to back but is having a hard time getting her to follow these instructions. Therefore concerned that she could definitely be having another UTI.  Has noticed no other new changes, new signs or symptoms other than above.     Home Meds:   Outpatient Medications Prior to Visit  Medication Sig Dispense Refill  . ARIPiprazole (ABILIFY) 5 MG tablet TAKE ONE (1) TABLET EACH DAY 30 tablet 5  . atorvastatin (LIPITOR) 80 MG tablet TAKE ONE (1) TABLET BY MOUTH EVERY DAY WITH BREAKFAST 90 tablet 1  . escitalopram (LEXAPRO) 20 MG tablet TAKE ONE (1) TABLET BY MOUTH EVERY DAY 90 tablet 3  . memantine (NAMENDA) 10 MG tablet TAKE ONE TABLET BY MOUTH TWICE A DAY 90 tablet 0  . Multiple Vitamins-Minerals (PRESERVISION AREDS 2 PO) Take 2 capsules by mouth daily.    Marland Kitchen omeprazole (PRILOSEC) 20 MG capsule Take 1 capsule (20 mg total) by mouth daily. 90 capsule 4  . propranolol ER (INDERAL LA) 60 MG 24 hr capsule Take 1 capsule (60 mg total) by mouth daily. 90 capsule 1  . rivastigmine (EXELON) 9.5 mg/24hr APPLY 1 PATCH TO THE SKIN DAILY 30 patch 5  . valACYclovir  (VALTREX) 1000 MG tablet Take 1 tablet (1,000 mg total) by mouth 3 (three) times daily. (Patient taking differently: Take 1,000 mg by mouth 3 (three) times daily. Takes prn for shingles) 21 tablet 0   No facility-administered medications prior to visit.     Allergies: No Known Allergies    Review of Systems: See HPI for pertinent ROS. All other ROS negative.    Physical Exam: Blood pressure 140/86, pulse 63, temperature 97.9 F (36.6 C), temperature source Oral, resp. rate 14, weight 120 lb 6.4 oz (54.6 kg), SpO2 99 %., Body mass index is 22.02 kg/m. General:  WNWD WF. Appears in no acute distress. Neck: Supple. No thyromegaly. No lymphadenopathy. Lungs: Clear bilaterally to auscultation without wheezes, rales, or rhonchi. Breathing is unlabored. Heart: Regular rhythm. No murmurs, rubs, or gallops. Msk:  Strength and tone normal for age. Extremities/Skin: Warm and dry.  Neuro:  Moves all extremities spontaneously.  Psych:  Responds to questions appropriately with a normal affect.   Results for orders placed or performed in visit on 06/30/17  Urinalysis, Routine w reflex microscopic  Result Value Ref Range   Color, Urine YELLOW YELLOW   APPearance CLEAR CLEAR   Specific Gravity, Urine 1.025 1.001 - 1.035   pH 5.5 5.0 - 8.0   Glucose, UA NEGATIVE NEGATIVE   Bilirubin Urine NEGATIVE  NEGATIVE   Ketones, ur NEGATIVE NEGATIVE   Hgb urine dipstick NEGATIVE NEGATIVE   Protein, ur NEGATIVE NEGATIVE   Nitrite NEGATIVE NEGATIVE   Leukocytes, UA NEGATIVE NEGATIVE     ASSESSMENT AND PLAN:  76 y.o. year old female with   1. Abnormal urine color - Urinalysis, Routine w reflex microscopic  2. Dementia with behavioral disturbance, unspecified dementia type   3. Subacute confusional state - CBC with Differential/Platelet - COMPLETE METABOLIC PANEL WITH GFR - TSH   Discussed that urinalysis is normal with no signs of urinary tract infection. Will check other labs to check for  other possible causes for patient's recent behavior change. If there is any lab abnormality to address then this will be addressed. Otherwise daughter will continue to follow up with [facility] neurology regarding patient's recent behavior changes.    Signed, [address] [place], [place], [facility] 06/30/2017 11:49 AM

## 2017-06-30 NOTE — Telephone Encounter (Signed)
Pt's daughter called the office (she sent an email today also she advised) said she took pt to PCP today to check for UTI but it was negative, she thought this could be reason for the combativeness. For the past 2 days the pt is not sleeping, she's cussing, hitting the daughter, very agitated. She said no medication has changed, no routine change. She said this starts in evening and continues on thru the night. Daughter needs advice. Please call  Pharmacy: [place] - it closes at 7pm.

## 2017-06-30 NOTE — Telephone Encounter (Signed)
The patient is having ongoing problems with sundowning at night. She is on Abilify 5 mg in the evening, we will go up to 10 mg. I will give them up her prescription for Ativan to take if needed in the evening hours for agitation.  The daughter is interested in learning about the research program for dementia associated with agitation.

## 2017-07-01 ENCOUNTER — Other Ambulatory Visit: Payer: Self-pay

## 2017-07-01 ENCOUNTER — Other Ambulatory Visit: Payer: Medicare Other

## 2017-07-01 DIAGNOSIS — E162 Hypoglycemia, unspecified: Secondary | ICD-10-CM | POA: Diagnosis not present

## 2017-07-01 DIAGNOSIS — R7309 Other abnormal glucose: Secondary | ICD-10-CM

## 2017-07-01 LAB — COMPLETE METABOLIC PANEL WITH GFR
ALBUMIN: 3.9 g/dL (ref 3.6–5.1)
ALK PHOS: 82 U/L (ref 33–130)
ALT: 13 U/L (ref 6–29)
AST: 20 U/L (ref 10–35)
BILIRUBIN TOTAL: 0.5 mg/dL (ref 0.2–1.2)
BUN: 13 mg/dL (ref 7–25)
CALCIUM: 9.1 mg/dL (ref 8.6–10.4)
CO2: 26 mmol/L (ref 20–31)
CREATININE: 0.81 mg/dL (ref 0.60–0.93)
Chloride: 100 mmol/L (ref 98–110)
GFR, EST AFRICAN AMERICAN: 82 mL/min (ref >=60)
GFR, Est Non African American: 71 mL/min (ref >=60)
GLUCOSE: 40 mg/dL — AB (ref 70–99)
POTASSIUM: 4 mmol/L (ref 3.5–5.3)
Sodium: 141 mmol/L (ref 135–146)
Total Protein: 6.6 g/dL (ref 6.1–8.1)

## 2017-07-01 LAB — GLUCOSE, FINGERSTICK (STAT): Glucose, fingerstick: 122 mg/dL — ABNORMAL HIGH (ref 65–99)

## 2017-07-01 LAB — TSH: TSH: 3.94 mIU/L

## 2017-07-01 NOTE — Telephone Encounter (Signed)
Faxed printed/signed rx ativan to [facility]. Fax: [phone]. Received confirmation.

## 2017-07-13 ENCOUNTER — Encounter: Payer: Self-pay | Admitting: Family Medicine

## 2017-08-03 ENCOUNTER — Ambulatory Visit (INDEPENDENT_AMBULATORY_CARE_PROVIDER_SITE_OTHER): Payer: Medicare Other | Admitting: Family Medicine

## 2017-08-03 ENCOUNTER — Ambulatory Visit (HOSPITAL_COMMUNITY)
Admission: RE | Admit: 2017-08-03 | Discharge: 2017-08-03 | Disposition: A | Discharge status: Home / Self Care | Payer: Medicare Other | Source: Ambulatory Visit | Attending: Family Medicine | Admitting: Family Medicine

## 2017-08-03 ENCOUNTER — Encounter: Payer: Self-pay | Admitting: Family Medicine

## 2017-08-03 VITALS — BP 120/64 | HR 66 | Temp 98.3°F | Wt 122.2 lb

## 2017-08-03 DIAGNOSIS — G319 Degenerative disease of nervous system, unspecified: Secondary | ICD-10-CM | POA: Diagnosis not present

## 2017-08-03 DIAGNOSIS — R9082 White matter disease, unspecified: Secondary | ICD-10-CM | POA: Insufficient documentation

## 2017-08-03 DIAGNOSIS — R2990 Unspecified symptoms and signs involving the nervous system: Secondary | ICD-10-CM

## 2017-08-03 DIAGNOSIS — F0391 Unspecified dementia with behavioral disturbance: Secondary | ICD-10-CM | POA: Diagnosis not present

## 2017-08-03 DIAGNOSIS — R531 Weakness: Secondary | ICD-10-CM

## 2017-08-03 DIAGNOSIS — R4182 Altered mental status, unspecified: Secondary | ICD-10-CM | POA: Diagnosis not present

## 2017-08-03 LAB — BASIC METABOLIC PANEL
BUN: 14 mg/dL (ref 7–25)
CALCIUM: 9 mg/dL (ref 8.6–10.4)
CO2: 27 mmol/L (ref 20–32)
CREATININE: 0.94 mg/dL — AB (ref 0.60–0.93)
Chloride: 105 mmol/L (ref 98–110)
GLUCOSE: 99 mg/dL (ref 70–99)
Potassium: 4.3 mmol/L (ref 3.5–5.3)
Sodium: 141 mmol/L (ref 135–146)

## 2017-08-03 LAB — CBC
HCT: 37.8 % (ref 35.0–45.0)
Hemoglobin: 12.5 g/dL (ref 12.0–15.0)
MCH: 33.8 pg — AB (ref 27.0–33.0)
MCHC: 33.1 g/dL (ref 32.0–36.0)
MCV: 102.2 fL — AB (ref 80.0–100.0)
PLATELETS: 250 10*3/uL (ref 140–400)
RBC: 3.7 MIL/uL — AB (ref 3.80–5.10)
RDW: 13.2 % (ref 11.0–15.0)
WBC: 7.3 10*3/uL (ref 3.8–10.8)

## 2017-08-03 LAB — URINALYSIS, ROUTINE W REFLEX MICROSCOPIC
BILIRUBIN URINE: NEGATIVE
GLUCOSE, UA: NEGATIVE
HGB URINE DIPSTICK: NEGATIVE
LEUKOCYTES UA: NEGATIVE
Nitrite: NEGATIVE
Specific Gravity, Urine: 1.025 (ref 1.001–1.035)
pH: 5.5 (ref 5.0–8.0)

## 2017-08-03 LAB — URINALYSIS, MICROSCOPIC ONLY
Bacteria, UA: NONE SEEN [HPF]
CASTS: NONE SEEN [LPF]
Crystals: NONE SEEN [HPF]
RBC / HPF: NONE SEEN RBC/HPF (ref <=2)
WBC, UA: NONE SEEN WBC/HPF (ref <=5)
YEAST: NONE SEEN [HPF]

## 2017-08-03 NOTE — Patient Instructions (Addendum)
CT of head to be done We will call with lab results  F/u PENDING RESULTS

## 2017-08-03 NOTE — Assessment & Plan Note (Signed)
Concern for the strokelike symptoms we cannot get an accurate representation was going on from the patient because of her dementia. I did obtain a CT of the head which was negative for any acute infarct. She had Urine done in the office besides a mild dehydration no sign of any UTI this will be sent for culture. Her blood pressure looks fine she does not have any fever. At this time it was like more progression of her dementia and her typical outbursts that continue to happen. I did not change any of her medications. If something does come up on her cultures and we will treat. Of note her CBC and metabolic panel also unremarkable

## 2017-08-03 NOTE — Progress Notes (Signed)
Subjective:    Patient ID: Theresa Sloan, female    DOB: 1941/10/08, 76 y.o.   MRN: [id number]  Patient presents for Nocturia; dysuria urine; and alzheimers (x1day) Patient here with daughter. She has had increased agitation and aggression though she's had these symptoms progressive over the past 6 months at least. She is currently followed by neurology for her dementia she has Alzheimer's with behavioral disturbances. They have her on Namenda 10 mg and also lorazepam. She also has Abilify and Lexapro. He has been discussion with her PCP about placing her in a facility which the family has been looking into. Her daughter is currently the caretaker. She has had urinary tract infections in the past has caused some of her agitation therefore they would like this checked today. Last night she was fighting with her grandson and this morning she will not let her daughter in the house. Her daughter also noted that her walking was different in that she seemed to be slumping more towards her right side which is not her typical.   Review Of Systems: UANBLE TO OBTAIN per daughter   GEN- denies fatigue, fever, weight loss,weakness, recent illness HEENT- denies eye drainage, change in vision, nasal discharge, CVS- denies chest pain, palpitations RESP- denies SOB, cough, wheeze ABD- denies N/V, change in stools, abd pain GU- denies dysuria, hematuria, dribbling, incontinence MSK- denies joint pain, muscle aches, injury Neuro- denies headache, dizziness, syncope, seizure activity       Objective:    BP 120/64   Pulse 66   Temp 98.3 F (36.8 C) (Oral)   Wt 122 lb 3.2 oz (55.4 kg)   SpO2 97%   BMI 22.35 kg/m  GEN- NAD, alert and oriented x 2 HEENT- PERRL, EOMI, non injected sclera, pink conjunctiva, MMM, oropharynx clear Neck- Supple, no LAD  CVS- RRR, no murmur RESP-CTAB ABD-NABS,soft,NT,ND, no suprapubtic tenderness, no CVA tenderness  Neuro- CNII-XII in tact, tremor bilat UE, dragging  right foot some ? Baseline. She has short shuffling steps, no gross weakness of upper or lower extremeity, she is leaning over in chair a little toward right EXT- No edema Pulses- Radial  2+    Cath urine- permission obtained from daughter and patient- tolerated well     Assessment & Plan:      Problem List Items Addressed This Visit      Unprioritized   Dementia    Concern for the strokelike symptoms we cannot get an accurate representation was going on from the patient because of her dementia. I did obtain a CT of the head which was negative for any acute infarct. She had Urine done in the office besides a mild dehydration no sign of any UTI this will be sent for culture. Her blood pressure looks fine she does not have any fever. At this time it was like more progression of her dementia and her typical outbursts that continue to happen. I did not change any of her medications. If something does come up on her cultures and we will treat. Of note her CBC and metabolic panel also unremarkable       Other Visit Diagnoses    Weakness    -  Primary   Relevant Orders   Urinalysis, Routine w reflex microscopic (Completed)   Urine Culture   CBC (Completed)   Basic metabolic panel (Completed)   Stroke-like symptom       Relevant Orders   CT Head Wo Contrast (Completed)   CBC (Completed)  Basic metabolic panel (Completed)      Note: This dictation was prepared with Dragon dictation along with smaller phrase technology. Any transcriptional errors that result from this process are unintentional.

## 2017-08-04 LAB — URINE CULTURE: Organism ID, Bacteria: NO GROWTH

## 2017-08-09 ENCOUNTER — Other Ambulatory Visit: Payer: Self-pay | Admitting: Neurology

## 2017-08-10 ENCOUNTER — Encounter: Payer: Self-pay | Admitting: Family Medicine

## 2017-08-10 ENCOUNTER — Ambulatory Visit (INDEPENDENT_AMBULATORY_CARE_PROVIDER_SITE_OTHER): Payer: Medicare Other | Admitting: Family Medicine

## 2017-08-10 VITALS — BP 118/68 | HR 82 | Temp 98.1°F | Resp 16 | Ht 62.0 in | Wt 120.0 lb

## 2017-08-10 DIAGNOSIS — F05 Delirium due to known physiological condition: Secondary | ICD-10-CM

## 2017-08-10 DIAGNOSIS — F0281 Dementia in other diseases classified elsewhere with behavioral disturbance: Secondary | ICD-10-CM

## 2017-08-10 DIAGNOSIS — Z23 Encounter for immunization: Secondary | ICD-10-CM

## 2017-08-10 DIAGNOSIS — G309 Alzheimer's disease, unspecified: Secondary | ICD-10-CM
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
## 2017-08-16 ENCOUNTER — Telehealth: Payer: Self-pay | Admitting: Family Medicine

## 2017-08-16 NOTE — Telephone Encounter (Signed)
pts daughter kim called in about assisted living forms. At the visit some stuff was not added and/or taken off that should be on the form. pts daughter said she will drop it off tomorrow morning for [place] to correct, it needs to be done asap please.

## 2017-08-17 ENCOUNTER — Ambulatory Visit (INDEPENDENT_AMBULATORY_CARE_PROVIDER_SITE_OTHER): Payer: Medicare Other | Admitting: Family Medicine

## 2017-08-17 DIAGNOSIS — Z111 Encounter for screening for respiratory tuberculosis: Secondary | ICD-10-CM | POA: Diagnosis not present

## 2017-08-17 NOTE — Patient Instructions (Signed)
PPD TB skin test applied to right forearm.  Daughter is having done as pre placement to Assisted Living.  Applied without difficulty.  Daughter understands to bring mother back on Thursday for reading.

## 2017-08-17 NOTE — Telephone Encounter (Signed)
Forms completed per patient daughter specifications.   Awaiting signature on desk.

## 2017-08-17 NOTE — Telephone Encounter (Signed)
Theresa Sloan brought forms to be altered possibly  Put these forms in sandys folder

## 2017-08-19 ENCOUNTER — Ambulatory Visit: Payer: Medicare Other | Admitting: *Deleted

## 2017-08-19 DIAGNOSIS — Z111 Encounter for screening for respiratory tuberculosis: Secondary | ICD-10-CM

## 2017-08-19 LAB — TB SKIN TEST
Induration: 0 mm
TB Skin Test: NEGATIVE

## 2017-08-19 NOTE — Progress Notes (Signed)
Patient seen in office to have PPD read.   Noted area with small purplish discoloration (bruising). No redness noted. No raised area noted.   Read as negative.

## 2017-09-01 ENCOUNTER — Encounter: Payer: Self-pay | Admitting: Adult Health

## 2017-09-02 ENCOUNTER — Encounter: Payer: Self-pay | Admitting: Adult Health

## 2017-09-02 ENCOUNTER — Ambulatory Visit (INDEPENDENT_AMBULATORY_CARE_PROVIDER_SITE_OTHER): Payer: Medicare Other | Admitting: Adult Health

## 2017-09-02 ENCOUNTER — Encounter: Payer: Self-pay | Admitting: *Deleted

## 2017-09-02 VITALS — BP 129/66 | HR 56 | Wt 121.0 lb

## 2017-09-02 DIAGNOSIS — R413 Other amnesia: Secondary | ICD-10-CM

## 2017-09-02 MED ORDER — RIVASTIGMINE 9.5 MG/24HR TD PT24: MEDICATED_PATCH | TRANSDERMAL | 5 refills | Status: AC

## 2017-09-02 MED ORDER — MEMANTINE HCL 10 MG PO TABS: 10.0000 mg | ORAL_TABLET | Freq: Two times a day (BID) | ORAL | 11 refills | Status: AC

## 2017-09-02 NOTE — Patient Instructions (Signed)
Your Plan:  Continue Namenda and Exelon patch Memory score has declined. We will continue to monitor If your symptoms worsen or you develop new symptoms please let us know.   Thank you for coming to see [place] at [facility]. I hope we have been able to provide you high quality care today.  You may receive a patient satisfaction survey over the next few weeks. We would appreciate your feedback and comments so that we may continue to improve ourselves and the health of our patients.

## 2017-09-02 NOTE — Progress Notes (Signed)
I have read the note, and I agree with the clinical assessment and plan.  WILLIS,CHARLES KEITH   

## 2017-09-02 NOTE — Progress Notes (Signed)
PATIENT: Theresa Sloan DOB: 1941/02/16  REASON FOR VISIT: follow up- memory disturbance HISTORY FROM: patient  HISTORY OF PRESENT ILLNESS: Today 09/02/17 [name] he is a 76 year old female with a history of memory disturbance. She returns today for follow-up. The daughter feels that her memory has gotten worse in the last 3 months. She did see her primary care in September and they thought she potentially may have had a stroke according to the daughter. A CT of the head was done as well as blood work. All of her test was relatively unremarkable. She was tested for UTI which was negative. The patient is living with her daughter. She does require prompting with most all ADLs. She does not operate a motor vehicle. Her daughter helps her with her finances and with meal preparation. The daughter plans to take a vacation and the patient will spend 4 weeks at [facility]. She returns today for an evaluation.  HISTORY 06/17/17: Ms. Theresa Sloan is a 76 year old female with a history of memory disturbance. She returns today for follow-up. She is currently on Namenda and Exelon and tolerating both medications well. Her daughter is with her today. Daughter states that the patient is now living with her. She reports that this moved took place last Friday. She states since then she's noticed an increase in confusion. She has done odd things like sticking her hand in the toilet bowl. She is able to complete all ADLs independently. She does not operate a motor vehicle. Her daughter handles all of her finances. Patient denies any trouble sleeping. In June she was diagnosed with a bladder infection and was on antibiotics. Daughter states that once she completed the antibiotic her confusion improved. She returns today for an evaluation.   REVIEW OF SYSTEMS: Out of a complete 14 system review of symptoms, the patient complains only of the following symptoms, and all other reviewed systems are negative.  Runny nose,  murmur, snoring, sleep talking, sleep walking, hallucinations, confusion, agitation, tremors, memory loss  ALLERGIES: No Known Allergies  HOME MEDICATIONS: Outpatient Medications Prior to Visit  Medication Sig Dispense Refill  . ARIPiprazole (ABILIFY) 10 MG tablet Take 1 tablet (10 mg total) by mouth at bedtime. 30 tablet 3  . atorvastatin (LIPITOR) 80 MG tablet TAKE ONE (1) TABLET BY MOUTH EVERY DAY WITH BREAKFAST 90 tablet 1  . escitalopram (LEXAPRO) 20 MG tablet TAKE ONE (1) TABLET BY MOUTH EVERY DAY 90 tablet 3  . LORazepam (ATIVAN) 0.5 MG tablet Take 1 tablet (0.5 mg total) by mouth every 8 (eight) hours as needed for anxiety. 30 tablet 3  . memantine (NAMENDA) 10 MG tablet TAKE ONE TABLET BY MOUTH TWICE A DAY 90 tablet 0  . Multiple Vitamins-Minerals (PRESERVISION AREDS 2 PO) Take 2 capsules by mouth daily.    Marland Kitchen omeprazole (PRILOSEC) 20 MG capsule Take 1 capsule (20 mg total) by mouth daily. 90 capsule 4  . propranolol ER (INDERAL LA) 60 MG 24 hr capsule TAKE ONE (1) CAPSULE EACH DAY 90 capsule 1  . rivastigmine (EXELON) 9.5 mg/24hr APPLY 1 PATCH TO THE SKIN DAILY 30 patch 5  . valACYclovir (VALTREX) 1000 MG tablet Take 1 tablet (1,000 mg total) by mouth 3 (three) times daily. 21 tablet 0   No facility-administered medications prior to visit.     PAST MEDICAL HISTORY: Past Medical History:  Diagnosis Date  . Alzheimer disease   . Anxiety   . Dementia   . Essential and other specified forms of tremor  08/07/2014  . Gait difficulty 03/05/2015  . GERD (gastroesophageal reflux disease)   . Hyperlipidemia   . Hypertension   . Memory difficulty 08/07/2014  . TMJ (dislocation of temporomandibular joint)     PAST SURGICAL HISTORY: Past Surgical History:  Procedure Laterality Date  . CESAREAN SECTION  1992  . RETINAL TEAR REPAIR CRYOTHERAPY      FAMILY HISTORY: Family History  Problem Relation Age of Onset  . Hypertension Sister   . Tremor Sister     SOCIAL HISTORY: Social  History   Social History  . Marital status: Widowed    Spouse name: N/A  . Number of children: 2  . Years of education: 47 th   Occupational History  . retired    Social History Main Topics  . Smoking status: Never Smoker  . Smokeless tobacco: Never Used  . Alcohol use No  . Drug use: No  . Sexual activity: Not on file   Other Topics Concern  . Not on file   Social History Narrative   Living with daughter since July 2018   Patient is right handed.   Patient does not drink caffeine.      PHYSICAL EXAM  Vitals:   09/02/17 1419  BP: 129/66  Pulse: (!) 56  Weight: 121 lb (54.9 kg)   Body mass index is 22.13 kg/m.  MMSE - Mini Mental State Exam 09/02/2017 06/17/2017 03/17/2017  Orientation to time 0 0 1  Orientation to Place 1 3 2   Registration 3 3 3   Attention/ Calculation 0 5 5  Recall 0 0 0  Language- name 2 objects 1 2 2   Language- repeat 0 0 0  Language- follow 3 step command 2 2 3   Language- read & follow direction 1 1 1   Write a sentence 0 1 0  Copy design 0 0 0  Total score 8 17 17     Generalized: Well developed, in no acute distress   Neurological examination  Mentation: Alert.. Follows all commands speech and language fluent Cranial nerve II-XII: Pupils were equal round reactive to light. Extraocular movements were full, visual field were full on confrontational test. Facial sensation and strength were normal. Uvula tongue midline. Head turning and shoulder shrug  were normal and symmetric. Motor: The motor testing reveals 5 over 5 strength of all 4 extremities. Good symmetric motor tone is noted throughout.  Sensory: Sensory testing is intact to soft touch on all 4 extremities. No evidence of extinction is noted.  Coordination: Cerebellar testing reveals good finger-nose-finger and heel-to-shin bilaterally.  Gait and station: Gait is normal. Tandem gait not attempted Reflexes: Deep tendon reflexes are symmetric and normal bilaterally.   DIAGNOSTIC  DATA (LABS, IMAGING, TESTING) - I reviewed patient records, labs, notes, testing and imaging myself where available.  Lab Results  Component Value Date   WBC 7.3 08/03/2017   HGB 12.5 08/03/2017   HCT 37.8 08/03/2017   MCV 102.2 (H) 08/03/2017   PLT 250 08/03/2017      Component Value Date/Time   NA 141 08/03/2017 1058   NA 139 02/03/2016 1508   K 4.3 08/03/2017 1058   CL 105 08/03/2017 1058   CO2 27 08/03/2017 1058   GLUCOSE 99 08/03/2017 1058   GLUCOSE 122 (H) 07/01/2017 1046   BUN 14 08/03/2017 1058   BUN 9 02/03/2016 1508   CREATININE 0.94 (H) 08/03/2017 1058   CALCIUM 9.0 08/03/2017 1058   PROT 6.6 06/30/2017 1155   PROT 6.6 02/03/2016 1508  ALBUMIN 3.9 06/30/2017 1155   ALBUMIN 4.0 02/03/2016 1508   AST 20 06/30/2017 1155   ALT 13 06/30/2017 1155   ALKPHOS 82 06/30/2017 1155   BILITOT 0.5 06/30/2017 1155   BILITOT 0.3 02/03/2016 1508   GFRNONAA 71 06/30/2017 1155   GFRAA 82 06/30/2017 1155   Lab Results  Component Value Date   CHOL 158 03/01/2017   HDL 84 03/01/2017   LDLCALC 52 03/01/2017   TRIG 108 03/01/2017   CHOLHDL 1.9 03/01/2017   No results found for: HGBA1C Lab Results  Component Value Date   [id number] 02/03/2016   Lab Results  Component Value Date   TSH 3.94 06/30/2017      ASSESSMENT AND PLAN 76 y.o. year old female  has a past medical history of Alzheimer disease; Anxiety; Dementia; Essential and other specified forms of tremor (08/07/2014); Gait difficulty (03/05/2015); GERD (gastroesophageal reflux disease); Hyperlipidemia; Hypertension; Memory difficulty (08/07/2014); and TMJ (dislocation of temporomandibular joint). here with:  1. Dementia  Patient's memory score has declined significantly since the last visit MMSE today is 8 out of 30 was previously 17/30. She will continue on Namenda and Exelon. Patient's PCP checked blood work, urinalysis and CT of head all of which was unremarkable. We will continue to monitor her memory. She is  advised that if there is any significant changes they should let us know. She will follow-up in 6 months or sooner if needed.  I spent 15 minutes with the patient. 50% of this time was spent discussing memory score   Ward Givens, MSN, NP-C 09/02/2017, 2:50 PM [facility] [address], [place], [place] [address] [phone]

## 2017-09-06 ENCOUNTER — Ambulatory Visit: Payer: Medicare Other | Admitting: Adult Health

## 2017-09-09 DIAGNOSIS — R262 Difficulty in walking, not elsewhere classified: Secondary | ICD-10-CM | POA: Diagnosis not present

## 2017-09-09 DIAGNOSIS — F05 Delirium due to known physiological condition: Secondary | ICD-10-CM | POA: Diagnosis not present

## 2017-09-09 DIAGNOSIS — F0281 Dementia in other diseases classified elsewhere with behavioral disturbance: Secondary | ICD-10-CM | POA: Diagnosis not present

## 2017-09-09 DIAGNOSIS — F419 Anxiety disorder, unspecified: Secondary | ICD-10-CM | POA: Diagnosis not present

## 2017-09-09 DIAGNOSIS — G250 Essential tremor: Secondary | ICD-10-CM | POA: Diagnosis not present

## 2017-09-09 DIAGNOSIS — Z9181 History of falling: Secondary | ICD-10-CM | POA: Diagnosis not present

## 2017-09-09 DIAGNOSIS — Z87440 Personal history of urinary (tract) infections: Secondary | ICD-10-CM | POA: Diagnosis not present

## 2017-09-09 DIAGNOSIS — I10 Essential (primary) hypertension: Secondary | ICD-10-CM | POA: Diagnosis not present

## 2017-09-09 DIAGNOSIS — G309 Alzheimer's disease, unspecified: Secondary | ICD-10-CM | POA: Diagnosis not present

## 2017-09-14 DIAGNOSIS — G309 Alzheimer's disease, unspecified: Secondary | ICD-10-CM | POA: Diagnosis not present

## 2017-09-14 DIAGNOSIS — I10 Essential (primary) hypertension: Secondary | ICD-10-CM | POA: Diagnosis not present

## 2017-09-14 DIAGNOSIS — G250 Essential tremor: Secondary | ICD-10-CM | POA: Diagnosis not present

## 2017-09-14 DIAGNOSIS — F05 Delirium due to known physiological condition: Secondary | ICD-10-CM | POA: Diagnosis not present

## 2017-09-14 DIAGNOSIS — R262 Difficulty in walking, not elsewhere classified: Secondary | ICD-10-CM | POA: Diagnosis not present

## 2017-09-14 DIAGNOSIS — F0281 Dementia in other diseases classified elsewhere with behavioral disturbance: Secondary | ICD-10-CM | POA: Diagnosis not present

## 2017-09-17 DIAGNOSIS — I10 Essential (primary) hypertension: Secondary | ICD-10-CM | POA: Diagnosis not present

## 2017-09-17 DIAGNOSIS — G250 Essential tremor: Secondary | ICD-10-CM | POA: Diagnosis not present

## 2017-09-17 DIAGNOSIS — G309 Alzheimer's disease, unspecified: Secondary | ICD-10-CM | POA: Diagnosis not present

## 2017-09-17 DIAGNOSIS — R262 Difficulty in walking, not elsewhere classified: Secondary | ICD-10-CM | POA: Diagnosis not present

## 2017-09-17 DIAGNOSIS — F0281 Dementia in other diseases classified elsewhere with behavioral disturbance: Secondary | ICD-10-CM | POA: Diagnosis not present

## 2017-09-17 DIAGNOSIS — F05 Delirium due to known physiological condition: Secondary | ICD-10-CM | POA: Diagnosis not present

## 2017-09-20 DIAGNOSIS — G250 Essential tremor: Secondary | ICD-10-CM | POA: Diagnosis not present

## 2017-09-20 DIAGNOSIS — F05 Delirium due to known physiological condition: Secondary | ICD-10-CM | POA: Diagnosis not present

## 2017-09-20 DIAGNOSIS — R262 Difficulty in walking, not elsewhere classified: Secondary | ICD-10-CM | POA: Diagnosis not present

## 2017-09-20 DIAGNOSIS — F0281 Dementia in other diseases classified elsewhere with behavioral disturbance: Secondary | ICD-10-CM | POA: Diagnosis not present

## 2017-09-20 DIAGNOSIS — I10 Essential (primary) hypertension: Secondary | ICD-10-CM | POA: Diagnosis not present

## 2017-09-20 DIAGNOSIS — G309 Alzheimer's disease, unspecified: Secondary | ICD-10-CM | POA: Diagnosis not present

## 2017-09-23 DIAGNOSIS — I10 Essential (primary) hypertension: Secondary | ICD-10-CM | POA: Diagnosis not present

## 2017-09-23 DIAGNOSIS — F0281 Dementia in other diseases classified elsewhere with behavioral disturbance: Secondary | ICD-10-CM | POA: Diagnosis not present

## 2017-09-23 DIAGNOSIS — R262 Difficulty in walking, not elsewhere classified: Secondary | ICD-10-CM | POA: Diagnosis not present

## 2017-09-23 DIAGNOSIS — G309 Alzheimer's disease, unspecified: Secondary | ICD-10-CM | POA: Diagnosis not present

## 2017-09-23 DIAGNOSIS — G250 Essential tremor: Secondary | ICD-10-CM | POA: Diagnosis not present

## 2017-09-23 DIAGNOSIS — F05 Delirium due to known physiological condition: Secondary | ICD-10-CM | POA: Diagnosis not present

## 2017-09-28 DIAGNOSIS — G309 Alzheimer's disease, unspecified: Secondary | ICD-10-CM | POA: Diagnosis not present

## 2017-09-28 DIAGNOSIS — F0281 Dementia in other diseases classified elsewhere with behavioral disturbance: Secondary | ICD-10-CM | POA: Diagnosis not present

## 2017-09-28 DIAGNOSIS — R262 Difficulty in walking, not elsewhere classified: Secondary | ICD-10-CM | POA: Diagnosis not present

## 2017-09-28 DIAGNOSIS — F05 Delirium due to known physiological condition: Secondary | ICD-10-CM | POA: Diagnosis not present

## 2017-09-28 DIAGNOSIS — I10 Essential (primary) hypertension: Secondary | ICD-10-CM | POA: Diagnosis not present

## 2017-09-28 DIAGNOSIS — G250 Essential tremor: Secondary | ICD-10-CM | POA: Diagnosis not present

## 2017-09-30 DIAGNOSIS — I10 Essential (primary) hypertension: Secondary | ICD-10-CM | POA: Diagnosis not present

## 2017-09-30 DIAGNOSIS — F0281 Dementia in other diseases classified elsewhere with behavioral disturbance: Secondary | ICD-10-CM | POA: Diagnosis not present

## 2017-09-30 DIAGNOSIS — G250 Essential tremor: Secondary | ICD-10-CM | POA: Diagnosis not present

## 2017-09-30 DIAGNOSIS — F05 Delirium due to known physiological condition: Secondary | ICD-10-CM | POA: Diagnosis not present

## 2017-09-30 DIAGNOSIS — G309 Alzheimer's disease, unspecified: Secondary | ICD-10-CM | POA: Diagnosis not present

## 2017-09-30 DIAGNOSIS — R262 Difficulty in walking, not elsewhere classified: Secondary | ICD-10-CM | POA: Diagnosis not present

## 2017-10-01 ENCOUNTER — Encounter: Payer: Self-pay | Admitting: Adult Health

## 2017-10-04 ENCOUNTER — Encounter: Payer: Self-pay | Admitting: Adult Health

## 2017-10-06 ENCOUNTER — Encounter: Payer: Self-pay | Admitting: *Deleted

## 2017-10-08 ENCOUNTER — Telehealth: Payer: Self-pay | Admitting: Family Medicine

## 2017-10-08 NOTE — Telephone Encounter (Signed)
Theresa Sloan is putting mother in [facility] permanently. Needs [place] to either fill out new fl-2 or reprint the one that was filled out initially, also needs order put in for her PT. If we fill out new form please do not forget to include the patch that she uses.

## 2017-10-08 NOTE — Telephone Encounter (Signed)
[name] gray calling to request another fl2 form for this patient, she is going back into the nursing home facility please call her at [phone]

## 2017-10-11 NOTE — Telephone Encounter (Signed)
Form filled out and Theresa Sloan aware and will pick up tomorrow at her apt

## 2017-10-11 NOTE — Telephone Encounter (Signed)
See previous phone note.  

## 2017-10-12 ENCOUNTER — Ambulatory Visit: Payer: Medicare Other | Admitting: Family Medicine

## 2017-10-12 ENCOUNTER — Encounter: Payer: Self-pay | Admitting: Family Medicine

## 2017-10-12 ENCOUNTER — Ambulatory Visit (INDEPENDENT_AMBULATORY_CARE_PROVIDER_SITE_OTHER): Payer: Medicare Other | Admitting: Family Medicine

## 2017-10-12 VITALS — BP 108/64 | HR 60 | Temp 97.7°F | Resp 14 | Ht 62.0 in | Wt 121.0 lb

## 2017-10-12 DIAGNOSIS — F0391 Unspecified dementia with behavioral disturbance: Secondary | ICD-10-CM

## 2017-10-12 NOTE — Progress Notes (Signed)
Subjective:    Patient ID: Theresa Sloan, female    DOB: September 18, 1941, 76 y.o.   MRN: [id number]  Patient's daughter is going to have the patient move into a skilled nursing facility permanently.  This is scheduled for later this week.  This is mainly due to worsening dementia.  Patient does not even speak today during our encounter.  She has significantly worsening tremor.  She also demonstrates slow shuffling gait and almost parkinsonian-like features.  She has had an essential tremor for many many years however she has now developed a flat affect/masklike face ease, diminished blink reflex, and the pill-rolling tremor which are new.  I am not certain if the parkinsonian features are secondary to the Abilify or possibly represent a new problem.  Daughter is concerned because the patient complained of a sore throat yesterday and some right-sided otalgia however her exam today is completely normal Past Medical History:  Diagnosis Date  . Alzheimer disease   . Anxiety   . Dementia   . 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 Gait difficulty 03/05/2015  . GERD (gastroesophageal reflux disease)   . Hyperlipidemia   . Hypertension   . Memory difficulty 08/07/2014  . TMJ (dislocation of temporomandibular joint)    Past Surgical History:  Procedure Laterality Date  . CESAREAN SECTION  1992  . RETINAL TEAR REPAIR CRYOTHERAPY     Current Outpatient Medications on File Prior to Visit  Medication Sig Dispense Refill  . ARIPiprazole (ABILIFY) 10 MG tablet Take 1 tablet (10 mg total) by mouth at bedtime. 30 tablet 3  . atorvastatin (LIPITOR) 80 MG tablet TAKE ONE (1) TABLET BY MOUTH EVERY DAY WITH BREAKFAST 90 tablet 1  . escitalopram (LEXAPRO) 20 MG tablet TAKE ONE (1) TABLET BY MOUTH EVERY DAY 90 tablet 3  . LORazepam (ATIVAN) 0.5 MG tablet Take 1 tablet (0.5 mg total) by mouth every 8 (eight) hours as needed for anxiety. 30 tablet 3  . memantine (NAMENDA) 10 MG tablet Take 1  tablet (10 mg total) by mouth 2 (two) times daily. 60 tablet 11  . Multiple Vitamins-Minerals (PRESERVISION AREDS 2 PO) Take 2 capsules by mouth daily.    Marland Kitchen omeprazole (PRILOSEC) 20 MG capsule Take 1 capsule (20 mg total) by mouth daily. 90 capsule 4  . propranolol ER (INDERAL LA) 60 MG 24 hr capsule TAKE ONE (1) CAPSULE EACH DAY 90 capsule 1  . rivastigmine (EXELON) 9.5 mg/24hr APPLY 1 PATCH TO THE SKIN DAILY 30 patch 5  . valACYclovir (VALTREX) 1000 MG tablet Take 1 tablet (1,000 mg total) by mouth 3 (three) times daily. 21 tablet 0   No current facility-administered medications on file prior to visit.    No Known Allergies Social History   Socioeconomic History  . Marital status: Widowed    Spouse name: Not on file  . Number of children: 2  . Years of education: 43 th  . Highest education level: Not on file  Social Needs  . Financial resource strain: Not on file  . Food insecurity - worry: Not on file  . Food insecurity - inability: Not on file  . Transportation needs - medical: Not on file  . Transportation needs - non-medical: Not on file  Occupational History  . Occupation: retired  Tobacco Use  . Smoking status: Never Smoker  . Smokeless tobacco: Never Used  Substance and Sexual Activity  . Alcohol use: No  . Drug use: No  . Sexual  activity: Not on file  Other Topics Concern  . Not on file  Social History Narrative   Living with daughter since July 2018   Patient is right handed.   Patient does not drink caffeine.      Review of Systems  Genitourinary: Positive for frequency.  All other systems reviewed and are negative.      Objective:   Physical Exam  Constitutional: She appears well-developed and well-nourished.  HENT:  Right Ear: Tympanic membrane, external ear and ear canal normal.  Left Ear: Tympanic membrane, external ear and ear canal normal.  Nose: Nose normal.  Mouth/Throat: Oropharynx is clear and moist. No oropharyngeal exudate.    Cardiovascular: Normal rate, regular rhythm, normal heart sounds and intact distal pulses.  No murmur heard. Pulmonary/Chest: Effort normal and breath sounds normal. No respiratory distress. She has no wheezes. She has no rales.  Abdominal: Soft. Bowel sounds are normal. She exhibits no distension. There is no tenderness. There is no rebound and no guarding.  Musculoskeletal: She exhibits no edema.       Cervical back: She exhibits normal range of motion, no tenderness, no bony tenderness, no deformity, no pain and no spasm.  Vitals reviewed.         Assessment & Plan:  Dementia with behavioral disturbance, unspecified dementia type  No evidence of a serious bacterial infection.  The sore throat could represent an upper respiratory infection however her physical exam today is essentially normal aside from the worsening tremor, and the neurologic symptoms mentioned above.  She will be moving into a skilled nursing facility later this week so I hesitate to discontinue the Abilify during a period of time her delirium is likely going to happen due to disorientation.  Therefore I will make no changes.  However once the patient is stable in her new living arrangement, I would like to try to wean her off the Abilify to see if her movement problems will improve.  Patient's daughter agrees.

## 2017-10-24 ENCOUNTER — Encounter: Payer: Self-pay | Admitting: Family Medicine

## 2017-10-25 ENCOUNTER — Other Ambulatory Visit: Payer: Self-pay | Admitting: Family Medicine

## 2017-10-25 ENCOUNTER — Other Ambulatory Visit: Payer: Self-pay | Admitting: Neurology

## 2017-11-02 ENCOUNTER — Encounter: Payer: Self-pay | Admitting: Family Medicine

## 2017-11-09 ENCOUNTER — Ambulatory Visit: Payer: Medicare Other | Admitting: Podiatry

## 2017-11-16 ENCOUNTER — Encounter: Payer: Self-pay | Admitting: Podiatry

## 2017-11-16 ENCOUNTER — Ambulatory Visit (INDEPENDENT_AMBULATORY_CARE_PROVIDER_SITE_OTHER): Payer: Medicare Other | Admitting: Podiatry

## 2017-11-16 DIAGNOSIS — L600 Ingrowing nail: Secondary | ICD-10-CM

## 2017-11-16 DIAGNOSIS — B351 Tinea unguium: Secondary | ICD-10-CM

## 2017-11-18 NOTE — Progress Notes (Signed)
   Subjective: 76 year old female presents today for possible treatment and evaluation of fungal nails bilateral feet that became symptomatic 2-3 weeks ago.  She reports associated discoloration and thickening of the toenails.  She is also concerned for an ingrown toenail on the right great toe.  She reports associated soreness of the toe.  She has not done anything to treat the symptoms. Patient presents today for further treatment and evaluation.   Past Medical History:  Diagnosis Date  . Alzheimer disease   . Anxiety   . Dementia   . 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 Gait difficulty 03/05/2015  . GERD (gastroesophageal reflux disease)   . Hyperlipidemia   . Hypertension   . Memory difficulty 08/07/2014  . TMJ (dislocation of temporomandibular joint)     Objective: Physical Exam General: The patient is alert and oriented x3 in no acute distress.  Dermatology: Hyperkeratotic, discolored, thickened, onychodystrophy of nails noted bilaterally.  Skin is warm, dry and supple bilateral lower extremities. Negative for open lesions or macerations.  Vascular: Palpable pedal pulses bilaterally. No edema or erythema noted. Capillary refill within normal limits.  Neurological: Epicritic and protective threshold grossly intact bilaterally.   Musculoskeletal Exam: Range of motion within normal limits to all pedal and ankle joints bilateral. Muscle strength 5/5 in all groups bilateral.   Assessment:  #1 onychodystrophy bilateral toenails #2 possible onychomycosis #3  Ingrown toenail bilateral great toes lateral borders  Plan of Care:  #1 Patient was evaluated. #2  Today I discussed with the patient her symptoms regarding the ingrown toenails.  I discussed different treatment options and she opts for partial nail avulsion to the lateral borders of the bilateral great toes without application of phenol.  I explained that the nail avulsion sites will regrow over time. #3  bilateral great toes were prepped in aseptic manner and local anesthesia infiltration was utilized in a hallux block fashion.  Nail avulsion procedure was performed and dry sterile dressing was applied.  Postprocedure care instructions were provided both verbal and written. #4 return to clinic in 2 weeks   Edrick Kins, DPM [facility]  Dr. Edrick Kins, [address]                                        [place], [place] [address]                Office [phone]  Fax [phone]

## 2017-12-03 ENCOUNTER — Ambulatory Visit (INDEPENDENT_AMBULATORY_CARE_PROVIDER_SITE_OTHER): Payer: Medicare Other | Admitting: Podiatry

## 2017-12-03 ENCOUNTER — Encounter: Payer: Self-pay | Admitting: Podiatry

## 2017-12-03 DIAGNOSIS — L600 Ingrowing nail: Secondary | ICD-10-CM

## 2017-12-05 NOTE — Progress Notes (Signed)
   Subjective: Patient presents today 2 weeks post ingrown nail permanent nail avulsion procedure of the lateral borders of bilateral great toes. Patient states that the toe and nail fold is feeling much better.  He denies pain or drainage.  Patient is here for further evaluation and treatment.   Past Medical History:  Diagnosis Date  . Alzheimer disease   . Anxiety   . Dementia   . 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 Gait difficulty 03/05/2015  . GERD (gastroesophageal reflux disease)   . Hyperlipidemia   . Hypertension   . Memory difficulty 08/07/2014  . TMJ (dislocation of temporomandibular joint)     Objective: Skin is warm, dry and supple. Nail and respective nail fold appears to be healing appropriately. Open wound to the associated nail fold with a granular wound base and moderate amount of fibrotic tissue. Minimal drainage noted. Mild erythema around the periungual region likely due to phenol chemical matricectomy.  Assessment: #1 postop permanent partial nail avulsion lateral borders of bilateral great toes  #2 open wound periungual nail fold of respective digit.   Plan of care: #1 patient was evaluated  #2 debridement of open wound was performed to the periungual border of the respective toe using a currette. Antibiotic ointment and Band-Aid was applied. #3 patient is to return to clinic on a PRN basis.   Edrick Kins, DPM [facility]  Dr. Edrick Kins, [address]                                        [place], [place] [address]                Office [phone]  Fax [phone]

## 2017-12-08 ENCOUNTER — Telehealth: Payer: Self-pay | Admitting: Family Medicine

## 2017-12-08 NOTE — Telephone Encounter (Signed)
My part filled out and routed to Dr. Dennard Schaumann

## 2017-12-08 NOTE — Telephone Encounter (Signed)
Theresa Sloan came in and dropped off [employer] benefit papers to be filled out. She will complete section 1 once she gets it back from you guys. Placed in yellow folder.

## 2017-12-14 NOTE — Telephone Encounter (Signed)
Forms completed, left up front and [name] aware to pick them up

## 2017-12-31 ENCOUNTER — Encounter: Payer: Self-pay | Admitting: Family Medicine

## 2017-12-31 ENCOUNTER — Ambulatory Visit (INDEPENDENT_AMBULATORY_CARE_PROVIDER_SITE_OTHER): Payer: Medicare Other | Admitting: Family Medicine

## 2017-12-31 VITALS — BP 110/68 | HR 54 | Temp 97.7°F | Resp 12 | Ht 62.0 in | Wt 119.0 lb

## 2017-12-31 DIAGNOSIS — F0281 Dementia in other diseases classified elsewhere with behavioral disturbance: Secondary | ICD-10-CM | POA: Diagnosis not present

## 2017-12-31 DIAGNOSIS — G309 Alzheimer's disease, unspecified: Secondary | ICD-10-CM

## 2017-12-31 NOTE — Progress Notes (Signed)
Subjective:    Patient ID: Theresa Sloan, female    DOB: [birth year], 77 y.o.   MRN: [id number] 09/2017 Patient's daughter is going to have the patient move into a skilled nursing facility permanently.  This is scheduled for later this week.  This is mainly due to worsening dementia.  Patient does not even speak today during our encounter.  She has significantly worsening tremor.  She also demonstrates slow shuffling gait and almost parkinsonian-like features.  She has had an essential tremor for many many years however she has now developed a flat affect/masklike face ease, diminished blink reflex, and the pill-rolling tremor which are new.  I am not certain if the parkinsonian features are secondary to the Abilify or possibly represent a new problem.  Daughter is concerned because the patient complained of a sore throat yesterday and some right-sided otalgia however her exam today is completely normal.  At that time, my plan was: No evidence of a serious bacterial infection.  The sore throat could represent an upper respiratory infection however her physical exam today is essentially normal aside from the worsening tremor, and the neurologic symptoms mentioned above.  She will be moving into a skilled nursing facility later this week so I hesitate to discontinue the Abilify during a period of time her delirium is likely going to happen due to disorientation.  Therefore I will make no changes.  However once the patient is stable in her new living arrangement, I would like to try to wean her off the Abilify to see if her movement problems will improve.  Patient's daughter agrees.  12/31/17 Doing well in SNF.  Daughter reports decreasing appetite however weight is stable. No further delirium or combativeness.  However, daughter is concerned about increasing sedation and shuffling gait.  On abilify 10 mg poqhs.   Past Medical History:  Diagnosis Date  . Alzheimer disease   . Anxiety   . Dementia   .  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 Gait difficulty 03/05/2015  . GERD (gastroesophageal reflux disease)   . Hyperlipidemia   . Hypertension   . Memory difficulty 08/07/2014  . TMJ (dislocation of temporomandibular joint)    Past Surgical History:  Procedure Laterality Date  . CESAREAN SECTION  1992  . RETINAL TEAR REPAIR CRYOTHERAPY     Current Outpatient Medications on File Prior to Visit  Medication Sig Dispense Refill  . ARIPiprazole (ABILIFY) 10 MG tablet TAKE ONE TABLET BY MOUTH AT BEDTIME 30 tablet 5  . atorvastatin (LIPITOR) 80 MG tablet TAKE ONE (1) TABLET BY MOUTH EVERY DAY WITH BREAKFAST 90 tablet 1  . chlorthalidone (HYGROTEN) 100 MG tablet Take by mouth.    . escitalopram (LEXAPRO) 20 MG tablet TAKE ONE (1) TABLET BY MOUTH EVERY DAY 90 tablet 3  . LORazepam (ATIVAN) 0.5 MG tablet Take 1 tablet (0.5 mg total) by mouth every 8 (eight) hours as needed for anxiety. 30 tablet 3  . memantine (NAMENDA) 10 MG tablet Take 1 tablet (10 mg total) by mouth 2 (two) times daily. 60 tablet 11  . Multiple Vitamins-Minerals (PRESERVISION AREDS 2 PO) Take 2 capsules by mouth daily.    Marland Kitchen omeprazole (PRILOSEC) 20 MG capsule Take 1 capsule (20 mg total) by mouth daily. 90 capsule 4  . propranolol ER (INDERAL LA) 60 MG 24 hr capsule TAKE ONE (1) CAPSULE EACH DAY 90 capsule 1  . rivastigmine (EXELON) 9.5 mg/24hr APPLY 1 PATCH TO THE SKIN DAILY 30  patch 5  . simvastatin (ZOCOR) 40 MG tablet Take by mouth.    . valACYclovir (VALTREX) 1000 MG tablet Take 1 tablet (1,000 mg total) by mouth 3 (three) times daily. 21 tablet 0  . valACYclovir (VALTREX) 1000 MG tablet Take by mouth.     No current facility-administered medications on file prior to visit.    No Known Allergies Social History   Socioeconomic History  . Marital status: Widowed    Spouse name: Not on file  . Number of children: 2  . Years of education: 60 th  . Highest education level: Not on file  Social Needs    . Financial resource strain: Not on file  . Food insecurity - worry: Not on file  . Food insecurity - inability: Not on file  . Transportation needs - medical: Not on file  . Transportation needs - non-medical: Not on file  Occupational History  . Occupation: retired  Tobacco Use  . Smoking status: Never Smoker  . Smokeless tobacco: Never Used  Substance and Sexual Activity  . Alcohol use: No  . Drug use: No  . Sexual activity: Not on file  Other Topics Concern  . Not on file  Social History Narrative   Living with daughter since July 2018   Patient is right handed.   Patient does not drink caffeine.      Review of Systems  Genitourinary: Positive for frequency.  All other systems reviewed and are negative.      Objective:   Physical Exam  Constitutional: She appears well-developed and well-nourished.  HENT:  Right Ear: Tympanic membrane, external ear and ear canal normal.  Left Ear: Tympanic membrane, external ear and ear canal normal.  Nose: Nose normal.  Mouth/Throat: Oropharynx is clear and moist. No oropharyngeal exudate.  Cardiovascular: Normal rate, regular rhythm, normal heart sounds and intact distal pulses.  No murmur heard. Pulmonary/Chest: Effort normal and breath sounds normal. No respiratory distress. She has no wheezes. She has no rales.  Abdominal: Soft. Bowel sounds are normal. She exhibits no distension. There is no tenderness. There is no rebound and no guarding.  Musculoskeletal: She exhibits no edema.       Cervical back: She exhibits normal range of motion, no tenderness, no bony tenderness, no deformity, no pain and no spasm.  Vitals reviewed.         Assessment & Plan:  Alzheimer's dementia with behavioral disturbance, unspecified timing of dementia onset  Decrease abilify to 5 mg poqhs and rehceck in 3 months.  Add ensure 1 can poq supper.  If appetite worsens add remeron 30 mg poqhs.  Continue to try to wean off abilify.

## 2018-01-06 ENCOUNTER — Telehealth: Payer: Self-pay | Admitting: Family Medicine

## 2018-01-06 NOTE — Telephone Encounter (Signed)
[name] called stating that [facility] in [place] suspects that her mom may have a UTI wants to know if we can fax order over to them. Also wants to know where they will need to take the urine to have it ran once completed. Fax # for [facility] [phone].

## 2018-01-07 ENCOUNTER — Encounter: Payer: Self-pay | Admitting: Emergency Medicine

## 2018-01-07 ENCOUNTER — Other Ambulatory Visit: Payer: Self-pay

## 2018-01-07 ENCOUNTER — Emergency Department
Admission: EM | Admit: 2018-01-07 | Discharge: 2018-01-07 | Disposition: A | Discharge status: Home / Self Care | Payer: Medicare Other | Attending: Emergency Medicine | Admitting: Emergency Medicine

## 2018-01-07 ENCOUNTER — Emergency Department: Payer: Medicare Other

## 2018-01-07 ENCOUNTER — Other Ambulatory Visit: Payer: Self-pay | Admitting: Family Medicine

## 2018-01-07 ENCOUNTER — Telehealth: Payer: Self-pay | Admitting: Family Medicine

## 2018-01-07 ENCOUNTER — Other Ambulatory Visit: Payer: Medicare Other

## 2018-01-07 DIAGNOSIS — R350 Frequency of micturition: Secondary | ICD-10-CM

## 2018-01-07 DIAGNOSIS — R300 Dysuria: Secondary | ICD-10-CM

## 2018-01-07 DIAGNOSIS — G309 Alzheimer's disease, unspecified: Secondary | ICD-10-CM | POA: Diagnosis not present

## 2018-01-07 DIAGNOSIS — R410 Disorientation, unspecified: Secondary | ICD-10-CM | POA: Diagnosis not present

## 2018-01-07 DIAGNOSIS — Z79899 Other long term (current) drug therapy: Secondary | ICD-10-CM | POA: Diagnosis not present

## 2018-01-07 DIAGNOSIS — F0390 Unspecified dementia without behavioral disturbance: Secondary | ICD-10-CM | POA: Diagnosis not present

## 2018-01-07 DIAGNOSIS — I10 Essential (primary) hypertension: Secondary | ICD-10-CM | POA: Insufficient documentation

## 2018-01-07 DIAGNOSIS — R4182 Altered mental status, unspecified: Secondary | ICD-10-CM | POA: Diagnosis not present

## 2018-01-07 DIAGNOSIS — F05 Delirium due to known physiological condition: Secondary | ICD-10-CM

## 2018-01-07 DIAGNOSIS — R404 Transient alteration of awareness: Secondary | ICD-10-CM | POA: Diagnosis not present

## 2018-01-07 LAB — BASIC METABOLIC PANEL
ANION GAP: 9 (ref 5–15)
BUN: 15 mg/dL (ref 6–20)
CHLORIDE: 104 mmol/L (ref 101–111)
CO2: 28 mmol/L (ref 22–32)
Calcium: 9.2 mg/dL (ref 8.9–10.3)
Creatinine, Ser: 0.91 mg/dL (ref 0.44–1.00)
GFR calc Af Amer: >60 mL/min (ref >60)
GFR calc non Af Amer: >60 mL/min (ref >60)
GLUCOSE: 78 mg/dL (ref 65–99)
POTASSIUM: 3.6 mmol/L (ref 3.5–5.1)
Sodium: 141 mmol/L (ref 135–145)

## 2018-01-07 LAB — CBC
HEMATOCRIT: 36.1 % (ref 35.0–47.0)
HEMOGLOBIN: 12 g/dL (ref 12.0–16.0)
MCH: 33.2 pg (ref 26.0–34.0)
MCHC: 33.4 g/dL (ref 32.0–36.0)
MCV: 99.5 fL (ref 80.0–100.0)
Platelets: 214 10*3/uL (ref 150–440)
RBC: 3.63 MIL/uL — ABNORMAL LOW (ref 3.80–5.20)
RDW: 14.2 % (ref 11.5–14.5)
WBC: 7.9 10*3/uL (ref 3.6–11.0)

## 2018-01-07 LAB — TROPONIN I: Troponin I: <0.03 ng/mL (ref <0.03)

## 2018-01-07 LAB — URINALYSIS, COMPLETE (UACMP) WITH MICROSCOPIC
Bacteria, UA: NONE SEEN
Bilirubin Urine: NEGATIVE
GLUCOSE, UA: NEGATIVE mg/dL
HGB URINE DIPSTICK: NEGATIVE
Ketones, ur: NEGATIVE mg/dL
NITRITE: NEGATIVE
PH: 5 (ref 5.0–8.0)
Protein, ur: NEGATIVE mg/dL
Specific Gravity, Urine: 1.027 (ref 1.005–1.030)
Squamous Epithelial / LPF: NONE SEEN

## 2018-01-07 LAB — LACTIC ACID, PLASMA: LACTIC ACID, VENOUS: 1.6 mmol/L (ref 0.5–1.9)

## 2018-01-07 MED ORDER — SODIUM CHLORIDE 0.9 % IV BOLUS (SEPSIS)
500.0000 mL | Freq: Once | INTRAVENOUS | Status: AC
Start: 2018-01-07 — End: 2018-01-07
  Administered 2018-01-07: 500 mL via INTRAVENOUS

## 2018-01-07 NOTE — Telephone Encounter (Signed)
Informed pt to bring urine by here and we will run test on it.

## 2018-01-07 NOTE — Telephone Encounter (Signed)
Spoke Theresa Sloan and questions answered

## 2018-01-07 NOTE — ED Provider Notes (Signed)
[facility] Emergency Department Provider Note ____________________________________________   I have reviewed the triage vital signs and the triage nursing note.  HISTORY  Chief Complaint Altered Mental Status   Historian Level 5 Caveat History Limited by poor historian, dementia Daughter provides history  HPI ANALISSE Theresa Sloan is a 77 y.o. female with a history of Alzheimer's, stays at a nursing home, nursing report called patient started this morning to state that patient had been up all night walking around until at least 4 AM which is pretty unusual for her.  Daughter states that she was taken off of 10 mg Abilify decreased to 5 mg Abilify which she had been on for years due to concern about possibly additional sedation.  This was about a week ago.  This morning the daughter went over to check on her mother and noticed that she was sleeping and seemed to be tilted over to the right side and had trouble waking up.  She was a little concerned about the possibility of a stroke given that she was sleeping over to her right side.  Patient was so sleepy in fact that she seemed to have trouble getting up and walking around.  No reported other medical symptoms recently.  She seems a little slow to respond and interact, but is interacting and responding appropriately.   Past Medical History:  Diagnosis Date  . Alzheimer disease   . Anxiety   . Dementia   . 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 Gait difficulty 03/05/2015  . GERD (gastroesophageal reflux disease)   . Hyperlipidemia   . Hypertension   . Memory difficulty 08/07/2014  . TMJ (dislocation of temporomandibular joint)     Patient Active Problem List   Diagnosis Date Noted  . Essential tremor 02/03/2016  . Subacute confusional state 02/03/2016  . Gait difficulty 03/05/2015  . Dementia   . Memory difficulty 08/07/2014  . Essential and other specified forms of tremor  08/07/2014  . GERD (gastroesophageal reflux disease) 12/21/2013  . Insomnia 12/21/2013  . 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 Dry eyes 07/07/2012  . Epiretinal membrane (ERM) of left eye 07/07/2012  . 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 Pseudophakia of both eyes 07/07/2012    Past Surgical History:  Procedure Laterality Date  . CESAREAN SECTION  1992  . RETINAL TEAR REPAIR CRYOTHERAPY      Prior to Admission medications   Medication Sig Start Date End Date Taking? Authorizing Provider  ARIPiprazole (ABILIFY) 5 MG tablet Take 5 mg by mouth daily.   Yes [provider]  atorvastatin (LIPITOR) 80 MG tablet TAKE ONE (1) TABLET BY MOUTH EVERY DAY WITH BREAKFAST 10/25/17  Yes Pickard, Cammie Mcgee, MD  ENSURE PLUS (ENSURE PLUS) LIQD Take 237 mLs by mouth daily.   Yes [provider]  escitalopram (LEXAPRO) 20 MG tablet TAKE ONE (1) TABLET BY MOUTH EVERY DAY 05/21/17  Yes Kathrynn Ducking, MD  memantine (NAMENDA) 10 MG tablet Take 1 tablet (10 mg total) by mouth 2 (two) times daily. 09/02/17  Yes Ward Givens, NP  Multiple Vitamins-Minerals (PRESERVISION AREDS 2 PO) Take 2 capsules by mouth daily.   Yes [provider]  omeprazole (PRILOSEC) 20 MG capsule Take 1 capsule (20 mg total) by mouth daily. 10/13/16  Yes [name], MD  propranolol ER (INDERAL LA) 60 MG 24 hr capsule TAKE ONE (1) CAPSULE EACH DAY 08/09/17  Yes Kathrynn Ducking,  MD  rivastigmine (EXELON) 9.5 mg/24hr APPLY 1 PATCH TO THE SKIN DAILY 09/02/17  Yes Ward Givens, NP  ARIPiprazole (ABILIFY) 10 MG tablet TAKE ONE TABLET BY MOUTH AT BEDTIME Patient not taking: Reported on 01/07/2018 10/26/17   Kathrynn Ducking, MD  LORazepam (ATIVAN) 0.5 MG tablet Take 1 tablet (0.5 mg total) by mouth every 8 (eight) hours as needed for anxiety. Patient not taking: Reported on 01/07/2018 06/30/17   Kathrynn Ducking, MD  valACYclovir (VALTREX) 1000 MG  tablet Take 1 tablet (1,000 mg total) by mouth 3 (three) times daily. Patient not taking: Reported on 01/07/2018 04/08/17   [name], MD    No Known Allergies  Family History  Problem Relation Age of Onset  . Hypertension Sister   . Tremor Sister     Social History Social History   Tobacco Use  . Smoking status: Never Smoker  . Smokeless tobacco: Never Used  Substance Use Topics  . Alcohol use: No  . Drug use: No    Review of Systems  Constitutional: Negative for fevers. Eyes: Negative for visual changes. ENT: Negative for sore throat. Cardiovascular: Negative for chest pain. Respiratory: Negative for shortness of breath. Gastrointestinal: Negative for abdominal pain, vomiting and diarrhea. Genitourinary: Negative for dysuria. Musculoskeletal: Negative for back pain. Skin: Negative for rash. Neurological: Negative for headache.  ____________________________________________   PHYSICAL EXAM:  VITAL SIGNS: ED Triage Vitals  Enc Vitals Group     BP 01/07/18 1148 120/60     Pulse Rate 01/07/18 1300 (!) 44     Resp 01/07/18 1148 16     Temp 01/07/18 1148 98.9 F (37.2 C)     Temp Source 01/07/18 1148 Oral     SpO2 01/07/18 1300 97 %     Weight 01/07/18 1149 120 lb (54.4 kg)     Height --      Head Circumference --      Peak Flow --      Pain Score 01/07/18 1149 0     Pain Loc --      Pain Edu? --      Excl. in [place]? --      Constitutional: Sleeping but alert to voice and cooperative.  Disoriented, poor historian.  She does seem quite sleepy.  Well appearing overall and in no distress. HEENT   Head: Normocephalic and atraumatic.      Eyes: Conjunctivae are normal. Pupils equal and round.       Ears:         Nose: No congestion/rhinnorhea.   Mouth/Throat: Mucous membranes are mildly dry.   Neck: No stridor. Cardiovascular/Chest: Normal rate, regular rhythm.  No murmurs, rubs, or gallops. Respiratory: Normal respiratory effort without  tachypnea nor retractions. Breath sounds are clear and equal bilaterally. No wheezes/rales/rhonchi. Gastrointestinal: Soft. No distention, no guarding, no rebound. Nontender.    Genitourinary/rectal:Deferred Musculoskeletal: Nontender with normal range of motion in all extremities. No joint effusions.  No lower extremity tenderness.  No edema. Neurologic: No slurred speech, no facial droop.  Cranial nerves II through X intact.  Normal speech and language. No gross or focal neurologic deficits are appreciated. Skin:  Skin is warm, dry and intact. No rash noted. Psychiatric: Mood and affect are normal. Speech and behavior are normal. Patient exhibits appropriate insight and judgment.   ____________________________________________  LABS (pertinent positives/negatives) I, Lisa Roca, MD the attending physician have reviewed the labs noted below.  Labs Reviewed  URINALYSIS, COMPLETE (UACMP) WITH MICROSCOPIC - Abnormal;  Notable for the following components:      Result Value   Color, Urine YELLOW (*)    APPearance CLEAR (*)    Leukocytes, UA TRACE (*)    All other components within normal limits  CBC - Abnormal; Notable for the following components:   RBC 3.63 (*)    All other components within normal limits  URINE CULTURE  BASIC METABOLIC PANEL  TROPONIN I  LACTIC ACID, PLASMA  CBG MONITORING, ED    ____________________________________________    EKG I, Lisa Roca, MD, the attending physician have personally viewed and interpreted all ECGs.  50 bpm.  Sinus bradycardia.  Normal sinus rhythm.  Left bundle branch block.  Nonspecific ST and T wave. ____________________________________________  RADIOLOGY All Xrays were viewed by me.  Imaging interpreted by Radiologist, and I, Lisa Roca, MD the attending physician have reviewed the radiologist interpretation noted below.  CT without contrast:  IMPRESSION: No acute abnormality.  Mild atrophy and chronic microvascular  ischemic change __________________________________________  PROCEDURES  Procedure(s) performed: None  Critical Care performed: None   ____________________________________________  ED COURSE / ASSESSMENT AND PLAN  Pertinent labs & imaging results that were available during my care of the patient were reviewed by me and considered in my medical decision making (see chart for details).  Patient is sleepy here but arousable.  She is a bit of a poor historian due to her underlying dementia.  Daughter reports that she was taken off of her 10 mg Abilify down to 5 about a week ago.  Nursing staff had noted that she was awake pacing all night long without any focal deficits or other medical complaints.  When they discussed this with the daughter came who came to see her mother this morning the mother was sleeping and listing over to the right side.  Laboratory evaluation today shows leukocytes without nitrites or bacteria, I think urinary tract infection is unlikely, I will send a urinary culture.  White blood cell count is normal.  No acute electrolyte abnormalities.  No renal failure.  I am most suspicious that day she is excessively sleepy due to having been up all night which was quite unusual to her.  It sounds like this may be because of a decrease in her Abilify.  Indication is not having any focal neurologic symptoms here in the emergency department her head CT is reassuring.    We discussed reassuring exam and evaluation.  I have extremely low suspicion for stroke.  DIFFERENTIAL DIAGNOSIS: Including but not limited to dehydration, electrolyte disturbance, acute renal failure, stroke, urinary tract infection, etc.  CONSULTATIONS:   None   Patient / Family / Caregiver informed of clinical course, medical decision-making process, and agree with plan.   I discussed return precautions, follow-up instructions, and discharge instructions with patient and/or family.  Discharge  Instructions : You are evaluated for sleepiness this morning and awake all night, and no certain cause was found, but her exam and evaluation are overall reassuring today.  Watch carefully for the next several hours.    Return to emergency department immediately for any worsening condition including confusion or altered mental status, weakness, numbness, problems with speech, fever, or any other symptoms concerning to you.    ___________________________________________   FINAL CLINICAL IMPRESSION(S) / ED DIAGNOSES   Final diagnoses:  Sundowning      ___________________________________________        Note: This dictation was prepared with Dragon dictation. Any transcriptional errors that result from this process  are unintentional    Lisa Roca, MD 01/07/18 1526

## 2018-01-07 NOTE — Discharge Instructions (Signed)
You are evaluated for sleepiness this morning and awake all night, and no certain cause was found, but her exam and evaluation are overall reassuring today.  Watch carefully for the next several hours.    Return to emergency department immediately for any worsening condition including confusion or altered mental status, weakness, numbness, problems with speech, fever, or any other symptoms concerning to you.

## 2018-01-07 NOTE — Telephone Encounter (Signed)
LMTRC

## 2018-01-07 NOTE — Telephone Encounter (Signed)
Pt went to hosp d/t other issues.

## 2018-01-07 NOTE — ED Notes (Signed)
Pt ambulated without difficulty

## 2018-01-07 NOTE — ED Triage Notes (Addendum)
Pt to ED via EMS from [facility], per family pt is more lethargic than usual. Pt hx of Alzheimers   at baseline is A&O to self per facility. Pt is A&O to self at this time, drowsy upon arrival. VS stable.

## 2018-01-07 NOTE — Telephone Encounter (Signed)
[name] calling regarding her mom being in the hospital  Would like a call back today if possible  [phone]

## 2018-01-07 NOTE — ED Notes (Signed)
Family at bedside, states pt is more drowsy than normal but was told by facility pt did not go to bed until 4am last night. States pt has been leaning more towards the RT side since yesterday. Pt will follow commands and is oriented to self .

## 2018-01-09 LAB — URINE CULTURE: CULTURE: NO GROWTH

## 2018-01-10 ENCOUNTER — Encounter: Payer: Self-pay | Admitting: Adult Health

## 2018-01-12 ENCOUNTER — Encounter: Payer: Self-pay | Admitting: Adult Health

## 2018-01-12 NOTE — Telephone Encounter (Signed)
I called patient's daughter left VM.

## 2018-01-18 ENCOUNTER — Encounter: Payer: Self-pay | Admitting: Family Medicine

## 2018-01-19 ENCOUNTER — Encounter: Payer: Self-pay | Admitting: Family Medicine

## 2018-01-23 ENCOUNTER — Telehealth: Payer: Self-pay | Admitting: *Deleted

## 2018-01-23 NOTE — Telephone Encounter (Signed)
Spoke with patient's daughter, Maudie Mercury and advised her the NP will be out sick tomorrow. Apologized and advised she call this week to reschedule. Maudie Mercury stated she would like her mother to be seen this week if at all possible. She was in the hospital 2 weeks ago.  The follow up tomorrow had been rescheduled from April due to patient's recent hospitalization. This RN advised will let Lovey Newcomer RN know this, and we will do our best to get her seen this week.  [name] verbalized understanding, appreciation.

## 2018-01-24 ENCOUNTER — Ambulatory Visit: Payer: Medicare Other | Admitting: Adult Health

## 2018-01-24 NOTE — Telephone Encounter (Signed)
Spoke to [employer].  Made appt for tomorrow at 1400 for evaluation for her mother, Theresa Sloan.  She verbalized understanding.

## 2018-01-24 NOTE — Telephone Encounter (Signed)
Spoke to daughter, Maudie Mercury.  She would like her mother seen this week, Friday if openings made availabe.  [phone].  Mother seen in [facility] for dehydration, mood changes, gait issues.  Per [facility] (sundowning).  Pt now [facility].  Abilify decreased to 5mg  po daily per pcp. I relayed that if cancellation will call offer to her.  She was appreciative.

## 2018-01-25 ENCOUNTER — Ambulatory Visit (INDEPENDENT_AMBULATORY_CARE_PROVIDER_SITE_OTHER): Payer: Medicare Other | Admitting: Adult Health

## 2018-01-25 ENCOUNTER — Encounter: Payer: Self-pay | Admitting: Adult Health

## 2018-01-25 VITALS — BP 115/66 | HR 55 | Ht 60.0 in | Wt 115.8 lb

## 2018-01-25 DIAGNOSIS — F0280 Dementia in other diseases classified elsewhere without behavioral disturbance: Secondary | ICD-10-CM | POA: Diagnosis not present

## 2018-01-25 DIAGNOSIS — G309 Alzheimer's disease, unspecified: Secondary | ICD-10-CM

## 2018-01-25 NOTE — Patient Instructions (Signed)
Your Plan:  Continue Exelon and namenda Continue monitor symptoms  If your symptoms worsen or you develop new symptoms please let us know.   Thank you for coming to see [place] at [facility]. I hope we have been able to provide you high quality care today.  You may receive a patient satisfaction survey over the next few weeks. We would appreciate your feedback and comments so that we may continue to improve ourselves and the health of our patients.

## 2018-01-25 NOTE — Progress Notes (Signed)
I have read the note, and I agree with the clinical assessment and plan.  Larkin Morelos K Berania Peedin   

## 2018-01-25 NOTE — Progress Notes (Signed)
PATIENT: Theresa Sloan DOB: [birth year]  REASON FOR VISIT: follow up HISTORY FROM: patient  HISTORY OF PRESENT ILLNESS: Today 01/25/18 Theresa Sloan is a 77 year old female with a history of memory disturbance.  She returns today for follow-up.  The patient's daughter is with her today.  She reports that she was in the hospital earlier this month because she was very sleepy and needs to decide.  Daughter was concerned that she may have a UTI or possible stroke.  Her blood work and CT of the head at the hospital was unremarkable.  Her primary care has decreased her Abilify down to 5 mg daily.  The patient continues to live in assisted living facility.  She does require assistance with most ADLs.  They do have to help her eat.  Daughter has reported a decreased appetite.  The patient does have bilateral tremor in the upper extremities.  Daughter reports that her tremor is worse some days and better others.  Daughter also reports some days she has difficulty ambulating.  She reports that she is been sitting for a long time when she stands she will have a shuffling gait.  She has not had any falls.  Patient returns today for evaluation.  HISTORY 09/02/17 Theresa Sloan  is a 77 year old female with a history of memory disturbance. She returns today for follow-up. The daughter feels that her memory has gotten worse in the last 3 months. She did see her primary care in September and they thought she potentially may have had a stroke according to the daughter. A CT of the head was done as well as blood work. All of her test was relatively unremarkable. She was tested for UTI which was negative. The patient is living with her daughter. She does require prompting with most all ADLs. She does not operate a motor vehicle. Her daughter helps her with her finances and with meal preparation. The daughter plans to take a vacation and the patient will spend 4 weeks at [facility]. She returns today for an  evaluation.  REVIEW OF SYSTEMS: Out of a complete 14 system review of symptoms, the patient complains only of the following symptoms, and all other reviewed systems are negative.  See HPI  ALLERGIES: No Known Allergies  HOME MEDICATIONS: Outpatient Medications Prior to Visit  Medication Sig Dispense Refill  . ARIPiprazole (ABILIFY) 5 MG tablet Take 5 mg by mouth daily.    Marland Kitchen atorvastatin (LIPITOR) 80 MG tablet TAKE ONE (1) TABLET BY MOUTH EVERY DAY WITH BREAKFAST 90 tablet 1  . ENSURE PLUS (ENSURE PLUS) LIQD Take 237 mLs by mouth daily.    Marland Kitchen escitalopram (LEXAPRO) 20 MG tablet TAKE ONE (1) TABLET BY MOUTH EVERY DAY 90 tablet 3  . memantine (NAMENDA) 10 MG tablet Take 1 tablet (10 mg total) by mouth 2 (two) times daily. 60 tablet 11  . Multiple Vitamins-Minerals (PRESERVISION AREDS 2 PO) Take 2 capsules by mouth daily.    Marland Kitchen omeprazole (PRILOSEC) 20 MG capsule Take 1 capsule (20 mg total) by mouth daily. 90 capsule 4  . propranolol ER (INDERAL LA) 60 MG 24 hr capsule TAKE ONE (1) CAPSULE EACH DAY 90 capsule 1  . rivastigmine (EXELON) 9.5 mg/24hr APPLY 1 PATCH TO THE SKIN DAILY 30 patch 5  . valACYclovir (VALTREX) 1000 MG tablet Take 1 tablet (1,000 mg total) by mouth 3 (three) times daily. (Patient not taking: Reported on 01/07/2018) 21 tablet 0  . ARIPiprazole (ABILIFY) 10 MG tablet TAKE ONE TABLET  BY MOUTH AT BEDTIME (Patient not taking: Reported on 01/07/2018) 30 tablet 5  . LORazepam (ATIVAN) 0.5 MG tablet Take 1 tablet (0.5 mg total) by mouth every 8 (eight) hours as needed for anxiety. (Patient not taking: Reported on 01/25/2018) 30 tablet 3   No facility-administered medications prior to visit.     PAST MEDICAL HISTORY: Past Medical History:  Diagnosis Date  . Alzheimer disease   . Anxiety   . Dementia   . 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 Gait difficulty 03/05/2015  . GERD (gastroesophageal reflux disease)   . Hyperlipidemia   . Hypertension   . Memory  difficulty 08/07/2014  . TMJ (dislocation of temporomandibular joint)     PAST SURGICAL HISTORY: Past Surgical History:  Procedure Laterality Date  . CESAREAN SECTION  1992  . RETINAL TEAR REPAIR CRYOTHERAPY      FAMILY HISTORY: Family History  Problem Relation Age of Onset  . Hypertension Sister   . Tremor Sister     SOCIAL HISTORY: Social History   Socioeconomic History  . Marital status: Widowed    Spouse name: Not on file  . Number of children: 2  . Years of education: 39 th  . Highest education level: Not on file  Social Needs  . Financial resource strain: Not on file  . Food insecurity - worry: Not on file  . Food insecurity - inability: Not on file  . Transportation needs - medical: Not on file  . Transportation needs - non-medical: Not on file  Occupational History  . Occupation: retired  Tobacco Use  . Smoking status: Never Smoker  . Smokeless tobacco: Never Used  Substance and Sexual Activity  . Alcohol use: No  . Drug use: No  . Sexual activity: Not on file  Other Topics Concern  . Not on file  Social History Narrative   Living with daughter since July 2018   Patient is right handed.   Patient does not drink caffeine.      PHYSICAL EXAM  Vitals:   01/25/18 1348  BP: 115/66  Pulse: (!) 55  Weight: 115 lb 12.8 oz (52.5 kg)  Height: 5' (1.524 m)   Body mass index is 22.62 kg/m.  Generalized: Well developed, in no acute distress   Neurological examination  Mentation: Alert.. Follows all commands.  Speech is limited Cranial nerve II-XII: Pupils were equal round reactive to light. Extraocular movements were full, visual field were full on confrontational test. Facial sensation and strength were normal. Uvula tongue midline. Head turning and shoulder shrug  were normal and symmetric. Motor: The motor testing reveals 5 over 5 strength of all 4 extremities. Good symmetric motor tone is noted throughout.  Sensory: Sensory testing is intact to soft  touch on all 4 extremities. No evidence of extinction is noted.  Coordination: Cerebellar testing reveals good finger-nose-finger and heel-to-shin bilaterally.  Gait and station: Gait is normal.  No shuffling gait noted today.  Tandem gait not attempted. Reflexes: Deep tendon reflexes are symmetric and normal bilaterally.   DIAGNOSTIC DATA (LABS, IMAGING, TESTING) - I reviewed patient records, labs, notes, testing and imaging myself where available.  Lab Results  Component Value Date   WBC 7.9 01/07/2018   HGB 12.0 01/07/2018   HCT 36.1 01/07/2018   MCV 99.5 01/07/2018   PLT 214 01/07/2018      Component Value Date/Time   NA 141 01/07/2018 1232   NA 139 02/03/2016 1508   K 3.6 01/07/2018  1232   CL 104 01/07/2018 1232   CO2 28 01/07/2018 1232   GLUCOSE 78 01/07/2018 1232   GLUCOSE 122 (H) 07/01/2017 1046   BUN 15 01/07/2018 1232   BUN 9 02/03/2016 1508   CREATININE 0.91 01/07/2018 1232   CREATININE 0.94 (H) 08/03/2017 1058   CALCIUM 9.2 01/07/2018 1232   PROT 6.6 06/30/2017 1155   PROT 6.6 02/03/2016 1508   ALBUMIN 3.9 06/30/2017 1155   ALBUMIN 4.0 02/03/2016 1508   AST 20 06/30/2017 1155   ALT 13 06/30/2017 1155   ALKPHOS 82 06/30/2017 1155   BILITOT 0.5 06/30/2017 1155   BILITOT 0.3 02/03/2016 1508   GFRNONAA >60 01/07/2018 1232   GFRNONAA 71 06/30/2017 1155   GFRAA >60 01/07/2018 1232   GFRAA 82 06/30/2017 1155   Lab Results  Component Value Date   CHOL 158 03/01/2017   HDL 84 03/01/2017   LDLCALC 52 03/01/2017   TRIG 108 03/01/2017   CHOLHDL 1.9 03/01/2017   No results found for: HGBA1C Lab Results  Component Value Date   ERDEYCXK48 185 02/03/2016   Lab Results  Component Value Date   TSH 3.94 06/30/2017      ASSESSMENT AND PLAN 77 y.o. year old female  has a past medical history of Alzheimer disease, Anxiety, Dementia, Essential and other specified forms of tremor (08/07/2014), Gait difficulty (03/05/2015), GERD (gastroesophageal reflux disease),  Hyperlipidemia, Hypertension, Memory difficulty (08/07/2014), and TMJ (dislocation of temporomandibular joint). here with:  1.  Alzheimer's disease  We were unable to test patient's memory score today.  The patient will continue on Namenda and Exelon patch.  Based on the patient's symptoms it seems that there seems to be some progression of disease process.  I advised that we will continue monitoring symptoms.  If her symptoms worsen or she develops new symptoms they should let us know.  She will follow-up in 6 months or sooner if needed.  Ward Givens, MSN, NP-C 01/25/2018, 2:08 PM [facility] [address], [place], [place] [address] [phone]

## 2018-02-10 ENCOUNTER — Telehealth: Payer: Self-pay | Admitting: *Deleted

## 2018-02-10 NOTE — Telephone Encounter (Signed)
Agreed needs to go to er for chest pain

## 2018-02-10 NOTE — Telephone Encounter (Signed)
Received call from patient daughter, Maudie Mercury.   Reports that patient is voicing C/O chest pain and is "feeling horrible".   Advised that if patient is having chest pain, she needs to go to ER for evaluation.   Patient daughter upset that she could not be seen in office as no appointments are available. Daughter hung up on Probation officer.   MD to be made aware.

## 2018-02-17 ENCOUNTER — Encounter: Payer: Self-pay | Admitting: Podiatry

## 2018-02-20 ENCOUNTER — Encounter: Payer: Self-pay | Admitting: Family Medicine

## 2018-02-23 ENCOUNTER — Telehealth: Payer: Self-pay | Admitting: Neurology

## 2018-02-23 NOTE — Telephone Encounter (Signed)
Called daughter, Theresa Sloan back (on [place]). She requested follow up tomorrow. I advised no appt available tomorrow. Made f/u for 03/04/18 at 12pm, check in 1130am. Advised if any cx for sooner appt I will call and let her know. She verbalized understanding and appreciation for call.

## 2018-02-23 NOTE — Telephone Encounter (Signed)
I called the daughter. The patient is shuffling her feet  And leaning to one side. She has been on Abilify 5 mg. I will get a RV to see her again.

## 2018-02-26 ENCOUNTER — Telehealth: Payer: Self-pay | Admitting: Neurology

## 2018-02-26 DIAGNOSIS — R404 Transient alteration of awareness: Secondary | ICD-10-CM

## 2018-02-26 NOTE — Telephone Encounter (Signed)
I called the patient's daughter.  The patient is in an extended care facility, she had an episode lasted 2 minutes this evening of staring off, drooling.  She recovered mental status.  It is possible this could have represented a brief seizure event.  We will check an EEG study, the patient will be seen next week.

## 2018-02-27 ENCOUNTER — Encounter: Payer: Self-pay | Admitting: Neurology

## 2018-02-28 ENCOUNTER — Other Ambulatory Visit: Payer: Self-pay | Admitting: *Deleted

## 2018-02-28 DIAGNOSIS — R404 Transient alteration of awareness: Secondary | ICD-10-CM

## 2018-03-02 ENCOUNTER — Ambulatory Visit (HOSPITAL_COMMUNITY)
Admission: RE | Admit: 2018-03-02 | Discharge: 2018-03-02 | Disposition: A | Discharge status: Home / Self Care | Payer: Medicare Other | Source: Ambulatory Visit | Attending: Neurology | Admitting: Neurology

## 2018-03-02 ENCOUNTER — Encounter: Payer: Self-pay | Admitting: Neurology

## 2018-03-02 ENCOUNTER — Other Ambulatory Visit: Payer: Self-pay | Admitting: Neurology

## 2018-03-02 ENCOUNTER — Telehealth: Payer: Self-pay | Admitting: Neurology

## 2018-03-02 DIAGNOSIS — R404 Transient alteration of awareness: Secondary | ICD-10-CM

## 2018-03-02 DIAGNOSIS — R9401 Abnormal electroencephalogram [EEG]: Secondary | ICD-10-CM | POA: Diagnosis not present

## 2018-03-02 DIAGNOSIS — Z79899 Other long term (current) drug therapy: Secondary | ICD-10-CM | POA: Insufficient documentation

## 2018-03-02 NOTE — Telephone Encounter (Signed)
The daughter Anastasia Pall [phone]) called back.    Her pharmacy through assisted living is [facility] [phone]

## 2018-03-02 NOTE — Telephone Encounter (Signed)
I called and left a message with the daughter.  The EEG study does show some irritability, I will get the patient started on seizure medications, I will opt for Lamictal starting in low dose.  The patient resides at [facility], I do not have the telephone number, the daughter is to contact our office with the appropriate number.   EEG March 02, 2018:  Impression: This awake and drowsy EEG is abnormal due to the presence of: 1. Mild to moderate diffuse slowing of the waking background 2. Occasional right anterior temporal and rare left anterior  temporal epileptiform discharges

## 2018-03-02 NOTE — Progress Notes (Signed)
EEG complete - results pending 

## 2018-03-02 NOTE — Procedures (Signed)
ELECTROENCEPHALOGRAM REPORT  Date of Study: 03/02/2018  Patient's Name: Theresa Sloan MRN: [id number] Date of Birth: 1941-03-25  Referring Provider: Dr. Margette Fast  Clinical History: This is a 77 year old woman with a staring spell.  Medications: Abilify Lipitor Lexapro Namenda Exelon  Technical Summary: A multichannel digital EEG recording measured by the international 10-20 system with electrodes applied with paste and impedances below 5000 ohms performed in our laboratory with EKG monitoring in an awake and drowsy patient.  Hyperventilation was not performed. Photic stimulation was performed.  The digital EEG was referentially recorded, reformatted, and digitally filtered in a variety of bipolar and referential montages for optimal display.    Description: The patient is awake and drowsy during the recording.  During maximal wakefulness, there is a poorly sustained low to medium voltage 7 Hz posterior dominant rhythm that attenuates with eye opening. This is admixed with a moderate amount of diffuse 4-5 Hz theta and occasional diffuse 2-3 Hz delta slowing of the waking background.  During drowsiness, there is an increase in theta and delta slowing of the background. Deeper stages of sleep were not captured. Photic stimulation did not elicit any abnormalities. There were occasional right anterior temporal sharp waves seen. There were rare independent left anterior temporal sharp waves noted as well. She had a left hand tremor during the study with no epileptiform correlate. There were no electrographic seizures seen.    EKG lead was unremarkable.  Impression: This awake and drowsy EEG is abnormal due to the presence of: 1. Mild to moderate diffuse slowing of the waking background 2. Occasional right anterior temporal and rare left anterior temporal epileptiform discharges  Clinical Correlation of the above findings indicates diffuse cerebral dysfunction that is non-specific  in etiology and can be seen with hypoxic/ischemic injury, toxic/metabolic encephalopathies, neurodegenerative disorders, or medication effect. There is a possible tendency for seizures to arise from the bilateral temporal regions. Clinical correlation is advised.   Ellouise Newer, M.D.

## 2018-03-03 ENCOUNTER — Ambulatory Visit (HOSPITAL_COMMUNITY): Payer: Medicare Other

## 2018-03-03 ENCOUNTER — Ambulatory Visit: Payer: Medicare Other | Admitting: Adult Health

## 2018-03-03 MED ORDER — LAMOTRIGINE 25 MG PO TABS: ORAL_TABLET | ORAL | 3 refills | Status: DC

## 2018-03-03 NOTE — Addendum Note (Signed)
Addended by: Kathrynn Ducking on: 03/03/2018 10:34 AM   Modules accepted: Orders

## 2018-03-03 NOTE — Telephone Encounter (Signed)
I called and talked with the patient's nurse.  I will fax him the prescription for the Lamictal at [phone].

## 2018-03-04 ENCOUNTER — Ambulatory Visit (INDEPENDENT_AMBULATORY_CARE_PROVIDER_SITE_OTHER): Payer: Medicare Other | Admitting: Neurology

## 2018-03-04 ENCOUNTER — Encounter: Payer: Self-pay | Admitting: Neurology

## 2018-03-04 VITALS — BP 122/68 | HR 56 | Ht 60.0 in | Wt 114.0 lb

## 2018-03-04 DIAGNOSIS — R413 Other amnesia: Secondary | ICD-10-CM | POA: Diagnosis not present

## 2018-03-04 DIAGNOSIS — G250 Essential tremor: Secondary | ICD-10-CM | POA: Diagnosis not present

## 2018-03-04 DIAGNOSIS — R404 Transient alteration of awareness: Secondary | ICD-10-CM | POA: Diagnosis not present

## 2018-03-04 DIAGNOSIS — R269 Unspecified abnormalities of gait and mobility: Secondary | ICD-10-CM | POA: Diagnosis not present

## 2018-03-04 MED ORDER — CARBIDOPA-LEVODOPA 25-100 MG PO TABS: 1.0000 | ORAL_TABLET | Freq: Three times a day (TID) | ORAL | 2 refills | Status: DC

## 2018-03-04 NOTE — Patient Instructions (Signed)
   We will start Sinemet 25/100 mg tablet, start at 1/2 tablet three times a day for 1 month, then start 1 tablet three times a day.  Sinemet (carbidopa) may result in confusion or hallucinations, drowsiness, nausea, or dizziness. If any significant side effects are noted, please contact our office. Sinemet may not be well absorbed when taken with high protein meals, if tolerated it is best to take 30-45 minutes before you eat.

## 2018-03-04 NOTE — Progress Notes (Signed)
Reason for visit: Parkinsonism  Theresa Sloan is an 77 y.o. female  History of present illness:  Theresa Sloan is a 77 year old right-handed white female with a history of a progressive memory disturbance.  The patient has been on Abilify, she was cut from a 10 mg dose to a 5 mg dose 3 months ago.  Within the last 2 months she has had some problems with shuffling her feet, she has developed tremors, and she has begun leaning to the right with a stooped posture.  The patient also had an event of transient alteration of awareness.  She was noted to have a staring episode lasting about 2 minutes associated with drooling.  The patient has undergone an EEG study that showed right anterior temporal epileptiform discharges.  The patient has just started Lamictal.  The patient has not had any recent falls.  She does sleep a lot during the day.  She comes in today for an urgent evaluation.  Past Medical History:  Diagnosis Date  . Alzheimer disease   . Anxiety   . Dementia   . 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 Gait difficulty 03/05/2015  . GERD (gastroesophageal reflux disease)   . Hyperlipidemia   . Hypertension   . Memory difficulty 08/07/2014  . TMJ (dislocation of temporomandibular joint)     Past Surgical History:  Procedure Laterality Date  . CESAREAN SECTION  1992  . RETINAL TEAR REPAIR CRYOTHERAPY      Family History  Problem Relation Age of Onset  . Hypertension Sister   . Tremor Sister     Social history:  reports that she has never smoked. She has never used smokeless tobacco. She reports that she does not drink alcohol or use drugs.   No Known Allergies  Medications:  Prior to Admission medications   Medication Sig Start Date End Date Taking? Authorizing Provider  acetaminophen (TYLENOL 8 HOUR) 650 MG CR tablet Take 650 mg by mouth every 6 (six) hours as needed for pain.   Yes [provider]  acetaminophen (TYLENOL) 500 MG tablet Take  500 mg by mouth daily.   Yes [provider]  ARIPiprazole (ABILIFY) 5 MG tablet Take 5 mg by mouth daily.   Yes [provider]  atorvastatin (LIPITOR) 80 MG tablet TAKE ONE (1) TABLET BY MOUTH EVERY DAY WITH BREAKFAST 10/25/17  Yes Pickard, Cammie Mcgee, MD  ENSURE PLUS (ENSURE PLUS) LIQD Take 237 mLs by mouth daily.   Yes [provider]  escitalopram (LEXAPRO) 20 MG tablet TAKE ONE (1) TABLET BY MOUTH EVERY DAY 05/21/17  Yes Kathrynn Ducking, MD  lamoTRIgine (LAMICTAL) 25 MG tablet 1 tablet twice daily for 2 weeks, then take 2 tablets twice daily for 2 weeks, then take 3 tablets twice daily 03/03/18  Yes Kathrynn Ducking, MD  memantine (NAMENDA) 10 MG tablet Take 1 tablet (10 mg total) by mouth 2 (two) times daily. 09/02/17  Yes Ward Givens, NP  Multiple Vitamins-Minerals (PRESERVISION AREDS 2 PO) Take 2 capsules by mouth daily.   Yes [provider]  omeprazole (PRILOSEC) 20 MG capsule Take 1 capsule (20 mg total) by mouth daily. 10/13/16  Yes [name], MD  propranolol ER (INDERAL LA) 60 MG 24 hr capsule TAKE ONE (1) CAPSULE EACH DAY 08/09/17  Yes Kathrynn Ducking, MD  rivastigmine (EXELON) 9.5 mg/24hr APPLY 1 PATCH TO THE SKIN DAILY 09/02/17  Yes Ward Givens, NP  valACYclovir (VALTREX) 1000  MG tablet Take 1 tablet (1,000 mg total) by mouth 3 (three) times daily. 04/08/17  Yes [name], MD    ROS:  Out of a complete 14 system review of symptoms, the patient complains only of the following symptoms, and all other reviewed systems are negative.  Memory loss Possible seizure event Tremors Confusion Sleep talking Leg swelling, heart murmur  Blood pressure 122/68, pulse (!) 56, height 5' (1.524 m), weight 114 lb (51.7 kg).  Physical Exam  General: The patient is alert and cooperative at the time of the examination.  Skin: No significant peripheral edema is noted.   Neurologic Exam  Mental status: The patient is alert and  oriented x 2 at the time of the examination (not oriented to date).   Cranial nerves: Facial symmetry is present. Speech is normal, no aphasia or dysarthria is noted. Extraocular movements are full. Visual fields are full.  Masking of the face is seen.  Motor: The patient has good strength in all 4 extremities.  Sensory examination: Soft touch sensation is symmetric on the face, arms, and legs.  Coordination: The patient has good finger-nose-finger and heel-to-shin bilaterally.  The patient has apraxia with use of the extremities.  Resting tremors are seen with both upper extremities, slightly worse on the left.  Patient has a tendency to lean to the right.  Gait and station: The patient has the ability to arise from a seated position with arms crossed.  Once up, the patient can walk without assistance, she has a shuffling gait, feet are close together.  The patient has decreased arm swing and tremor in both arms with walking.  Tandem gait was not attempted.  Romberg is negative.  Reflexes: Deep tendon reflexes are symmetric.   Assessment/Plan:  1.  Parkinson's disease, parkinsonism  2.  Memory disorder  3.  Transient alteration of awareness, possible seizure  The patient has had an abnormal EEG evaluation, she will be going on Lamictal.  The patient clearly has features of parkinsonism, she is on Abilify currently.  I would recommend that the Abilify be discontinued.  The patient will be started on Sinemet taking 25/100 mg tablets, 1/2 tablet 3 times daily for 1 month and then go to 1 full tablet 3 times daily.  The patient will be set up for physical therapy for gait training.  The patient will follow-up in 4 months.   Jill Alexanders MD 03/04/2018 11:58 AM  [facility] [address] [place], [place] [address]  Phone [phone] Fax [phone]

## 2018-03-07 ENCOUNTER — Telehealth: Payer: Self-pay | Admitting: Neurology

## 2018-03-07 ENCOUNTER — Emergency Department (HOSPITAL_COMMUNITY)
Admission: EM | Admit: 2018-03-07 | Discharge: 2018-03-08 | Disposition: A | Discharge status: Home / Self Care | Payer: Medicare Other | Attending: Emergency Medicine | Admitting: Emergency Medicine

## 2018-03-07 ENCOUNTER — Other Ambulatory Visit: Payer: Self-pay

## 2018-03-07 ENCOUNTER — Emergency Department (HOSPITAL_COMMUNITY): Payer: Medicare Other

## 2018-03-07 DIAGNOSIS — G308 Other Alzheimer's disease: Secondary | ICD-10-CM | POA: Diagnosis not present

## 2018-03-07 DIAGNOSIS — I10 Essential (primary) hypertension: Secondary | ICD-10-CM | POA: Insufficient documentation

## 2018-03-07 DIAGNOSIS — Z79899 Other long term (current) drug therapy: Secondary | ICD-10-CM | POA: Diagnosis not present

## 2018-03-07 DIAGNOSIS — G20 Parkinson's disease: Secondary | ICD-10-CM

## 2018-03-07 DIAGNOSIS — F0280 Dementia in other diseases classified elsewhere without behavioral disturbance: Secondary | ICD-10-CM | POA: Insufficient documentation

## 2018-03-07 DIAGNOSIS — R269 Unspecified abnormalities of gait and mobility: Secondary | ICD-10-CM | POA: Diagnosis not present

## 2018-03-07 DIAGNOSIS — R4020 Unspecified coma: Secondary | ICD-10-CM | POA: Diagnosis not present

## 2018-03-07 DIAGNOSIS — R531 Weakness: Secondary | ICD-10-CM | POA: Insufficient documentation

## 2018-03-07 DIAGNOSIS — R29810 Facial weakness: Secondary | ICD-10-CM | POA: Diagnosis not present

## 2018-03-07 LAB — BASIC METABOLIC PANEL
Anion gap: 9 (ref 5–15)
BUN: 12 mg/dL (ref 6–20)
CALCIUM: 8.8 mg/dL — AB (ref 8.9–10.3)
CHLORIDE: 104 mmol/L (ref 101–111)
CO2: 22 mmol/L (ref 22–32)
Creatinine, Ser: 0.93 mg/dL (ref 0.44–1.00)
GFR calc non Af Amer: 59 mL/min — ABNORMAL LOW (ref >60)
Glucose, Bld: 79 mg/dL (ref 65–99)
Potassium: 3.7 mmol/L (ref 3.5–5.1)
SODIUM: 135 mmol/L (ref 135–145)

## 2018-03-07 LAB — CBC WITH DIFFERENTIAL/PLATELET
BASOS ABS: 0 10*3/uL (ref 0.0–0.1)
BASOS PCT: 0 %
EOS ABS: 0.2 10*3/uL (ref 0.0–0.7)
Eosinophils Relative: 2 %
HCT: 35.4 % — ABNORMAL LOW (ref 36.0–46.0)
HEMOGLOBIN: 11.4 g/dL — AB (ref 12.0–15.0)
Lymphocytes Relative: 34 %
Lymphs Abs: 2.9 10*3/uL (ref 0.7–4.0)
MCH: 32.4 pg (ref 26.0–34.0)
MCHC: 32.2 g/dL (ref 30.0–36.0)
MCV: 100.6 fL — ABNORMAL HIGH (ref 78.0–100.0)
MONO ABS: 0.8 10*3/uL (ref 0.1–1.0)
Monocytes Relative: 10 %
NEUTROS ABS: 4.5 10*3/uL (ref 1.7–7.7)
NEUTROS PCT: 54 %
Platelets: 212 10*3/uL (ref 150–400)
RBC: 3.52 MIL/uL — ABNORMAL LOW (ref 3.87–5.11)
RDW: 13.6 % (ref 11.5–15.5)
WBC: 8.4 10*3/uL (ref 4.0–10.5)

## 2018-03-07 LAB — I-STAT TROPONIN, ED: TROPONIN I, POC: 0 ng/mL (ref 0.00–0.08)

## 2018-03-07 NOTE — ED Triage Notes (Signed)
Pt coming from [facility]. CC right sided weakness. Family states patient has been leaning towards the right, has facial droop on right side and unsteady gait. Pt has hx of dementia, Alert and disoriented x2 per baseline. Pt able to follow commands. Bilateral equal grip on upper extremities and lower extremities.

## 2018-03-07 NOTE — ED Provider Notes (Signed)
[facility] EMERGENCY DEPARTMENT Provider Note   CSN: [id number] Arrival date & time: 03/07/18  2135     History   Chief Complaint Chief Complaint  Patient presents with  . Weakness    HPI Theresa Sloan is a 77 y.o. female with a past medical history of Alzheimer's dementia, hypertension, hyperlipidemia, essential tremor, who presents to ED for evaluation of weakness.  Per daughter, patient had an episode 2 days ago where she began having a shuffling gait and leaning towards the right side as she walked.  She states that those symptoms improved shortly after they began 2 days ago.  She returned to her nursing home and staff said that today they again noticed patient leaning to the right side and shuffling gait.  She had an episode last week where she "zoned out" and stared off into space for several minutes.  She was referred to neurology where they completed an EEG.  Patient has been on Abilify for several years.  She has had contracture of her head to the right side for several months.  She also had a tremor for several months.  They were unsure if this was due to her Abilify or new onset of Parkinson's disease.  They have put her on Sinemet and started her on Lamictal for possible seizures based on EEG findings.  Daughter is concerned here today because of the shuffling gait and leaning towards the right and putting her at risk for falls.  Denies any changes in mental status, fevers, URI symptoms.  Patient denies any pain.  History is limited due to patient's condition and dementia.  HPI  Past Medical History:  Diagnosis Date  . Alzheimer disease   . Anxiety   . Dementia   . 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 Gait difficulty 03/05/2015  . GERD (gastroesophageal reflux disease)   . Hyperlipidemia   . Hypertension   . Memory difficulty 08/07/2014  . TMJ (dislocation of temporomandibular joint)     Patient Active Problem List   Diagnosis  Date Noted  . Essential tremor 02/03/2016  . Subacute confusional state 02/03/2016  . Gait difficulty 03/05/2015  . Dementia   . Memory difficulty 08/07/2014  . 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 GERD (gastroesophageal reflux disease) 12/21/2013  . Insomnia 12/21/2013  . 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 Dry eyes 07/07/2012  . Epiretinal membrane (ERM) of left eye 07/07/2012  . 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 Pseudophakia of both eyes 07/07/2012    Past Surgical History:  Procedure Laterality Date  . CESAREAN SECTION  1992  . RETINAL TEAR REPAIR CRYOTHERAPY       OB History   None      Home Medications    Prior to Admission medications   Medication Sig Start Date End Date Taking? Authorizing Provider  acetaminophen (TYLENOL 8 HOUR) 650 MG CR tablet Take 650 mg by mouth every 6 (six) hours as needed for pain.   Yes [provider]  acetaminophen (TYLENOL) 500 MG tablet Take 500 mg by mouth daily.   Yes [provider]  atorvastatin (LIPITOR) 80 MG tablet TAKE ONE (1) TABLET BY MOUTH EVERY DAY WITH BREAKFAST 10/25/17  Yes [name], MD  carbidopa-levodopa (SINEMET IR) 25-100 MG tablet Take 1 tablet by mouth 3 (three) times daily. Patient taking differently: Take 0.5 tablets by mouth 3 (three) times daily.  03/04/18  Yes Kathrynn Ducking, MD  ENSURE (ENSURE) Take 237 mLs by mouth every evening.   Yes [provider]  escitalopram (LEXAPRO) 20 MG tablet TAKE ONE (1) TABLET BY MOUTH EVERY DAY 05/21/17  Yes Kathrynn Ducking, MD  lamoTRIgine (LAMICTAL) 25 MG tablet 1 tablet twice daily for 2 weeks, then take 2 tablets twice daily for 2 weeks, then take 3 tablets twice daily Patient taking differently: Take 25-75 mg by mouth See admin instructions. 1 tablet twice daily for 2 weeks, then take 2 tablets twice daily for 2 weeks, then take 3 tablets twice daily  03/03/18  Yes Kathrynn Ducking, MD  memantine (NAMENDA) 10 MG tablet Take 1 tablet (10 mg total) by mouth 2 (two) times daily. 09/02/17  Yes Ward Givens, NP  Multiple Vitamins-Minerals (PRESERVISION AREDS 2 PO) Take 1 capsule by mouth 2 (two) times daily.    Yes [provider]  omeprazole (PRILOSEC) 20 MG capsule Take 1 capsule (20 mg total) by mouth daily. 10/13/16  Yes [name], MD  propranolol ER (INDERAL LA) 60 MG 24 hr capsule TAKE ONE (1) CAPSULE EACH DAY 08/09/17  Yes Kathrynn Ducking, MD  rivastigmine (EXELON) 9.5 mg/24hr APPLY 1 PATCH TO THE SKIN DAILY 09/02/17  Yes Ward Givens, NP  valACYclovir (VALTREX) 1000 MG tablet Take 1 tablet (1,000 mg total) by mouth 3 (three) times daily. Patient not taking: Reported on 03/08/2018 04/08/17   [name], MD    Family History Family History  Problem Relation Age of Onset  . Hypertension Sister   . Tremor Sister     Social History Social History   Tobacco Use  . Smoking status: Never Smoker  . Smokeless tobacco: Never Used  Substance Use Topics  . Alcohol use: No  . Drug use: No     Allergies   Patient has no known allergies.   Review of Systems Review of Systems  Constitutional: Negative for appetite change, chills and fever.  HENT: Negative for ear pain, rhinorrhea, sneezing and sore throat.   Eyes: Negative for photophobia and visual disturbance.  Respiratory: Negative for cough, chest tightness, shortness of breath and wheezing.   Cardiovascular: Negative for chest pain and palpitations.  Gastrointestinal: Negative for abdominal pain, blood in stool, constipation, diarrhea, nausea and vomiting.  Genitourinary: Negative for dysuria, hematuria and urgency.  Musculoskeletal: Positive for gait problem. Negative for myalgias.  Skin: Negative for rash.  Neurological: Negative for dizziness, facial asymmetry, weakness and light-headedness.     Physical Exam Updated Vital Signs BP (!) 127/106    Pulse (!) 55   Temp 97.7 F (36.5 C)   Resp 15   SpO2 100%   Physical Exam  Constitutional: She appears well-developed and well-nourished. No distress.  HENT:  Head: Normocephalic and atraumatic.  Nose: Nose normal.  Eyes: Pupils are equal, round, and reactive to light. Conjunctivae and EOM are normal. Right eye exhibits no discharge. Left eye exhibits no discharge. No scleral icterus.  Neck: Normal range of motion. Neck supple.  Cardiovascular: Normal rate, regular rhythm, normal heart sounds and intact distal pulses. Exam reveals no gallop and no friction rub.  No murmur heard. Pulmonary/Chest: Effort normal and breath sounds normal. No respiratory distress.  Abdominal: Soft. Bowel sounds are normal. She exhibits no distension. There is no tenderness. There is no guarding.  Musculoskeletal: Normal range of motion. She exhibits no edema.  Neurological: She is alert. She exhibits normal muscle tone. Coordination normal.  Alert and  oriented to self, family member.  Tremor noted at rest of bilateral arms. Pupils reactive. No facial asymmetry noted. Cranial nerves appear grossly intact. Sensation intact to light touch on face, BUE and BLE. Strength 5/5 in BUE and BLE. Able to perform finger to nose with R hand, but not with L hand. Head turned to R side.  Skin: Skin is warm and dry. No rash noted.  Psychiatric: She has a normal mood and affect.  Nursing note and vitals reviewed.    ED Treatments / Results  Labs (all labs ordered are listed, but only abnormal results are displayed) Labs Reviewed  BASIC METABOLIC PANEL - Abnormal; Notable for the following components:      Result Value   Calcium 8.8 (*)    GFR calc non Af Amer 59 (*)    All other components within normal limits  CBC WITH DIFFERENTIAL/PLATELET - Abnormal; Notable for the following components:   RBC 3.52 (*)    Hemoglobin 11.4 (*)    HCT 35.4 (*)    MCV 100.6 (*)    All other components within normal limits    URINALYSIS, ROUTINE W REFLEX MICROSCOPIC  I-STAT TROPONIN, ED    EKG EKG Interpretation  Date/Time:  Monday March 07 2018 21:40:57 EDT Ventricular Rate:  52 PR Interval:    QRS Duration: 143 QT Interval:  501 QTC Calculation: 466 R Axis:   4 Text Interpretation:  Sinus rhythm Left bundle branch block No significant change since last tracing Confirmed by Isla Pence [phone]) on 03/07/2018 9:54:05 PM   Radiology Dg Chest 2 View  Result Date: 03/07/2018 CLINICAL DATA:  Weakness EXAM: CHEST - 2 VIEW COMPARISON:  01/04/2017 FINDINGS: The heart size and mediastinal contours are within normal limits. Both lungs are clear. The visualized skeletal structures are unremarkable. Calcified nodule at the left lung base. IMPRESSION: No active cardiopulmonary disease. Electronically Signed   By: Donavan Foil M.D.   On: 03/07/2018 23:07   Ct Head Wo Contrast  Result Date: 03/07/2018 CLINICAL DATA:  Altered level of consciousness. Right-sided weakness. EXAM: CT HEAD WITHOUT CONTRAST TECHNIQUE: Contiguous axial images were obtained from the base of the skull through the vertex without intravenous contrast. COMPARISON:  Head CT 01/07/2018 FINDINGS: Brain: Stable degree of atrophy and chronic small vessel ischemia. Stable mild asymmetric enlargement of the right lateral ventricle compared to left. No intracranial hemorrhage, mass effect, or midline shift. No hydrocephalus. The basilar cisterns are patent. No evidence of territorial infarct or acute ischemia. No extra-axial or intracranial fluid collection. Vascular: Atherosclerosis of skullbase vasculature without hyperdense vessel or abnormal calcification. Skull: No fracture or focal lesion. Sinuses/Orbits: Small mucous retention cyst in the right maxillary sinus. No sinus fluid level or acute finding. Other: None. IMPRESSION: 1.  No acute intracranial abnormality. 2. Unchanged cerebral atrophy and chronic small vessel ischemia from prior exam. Electronically  Signed   By: Jeb Levering M.D.   On: 03/07/2018 23:04    Procedures Procedures (including critical care time)  Medications Ordered in ED Medications - No data to display   Initial Impression / Assessment and Plan / ED Course  I have reviewed the triage vital signs and the nursing notes.  Pertinent labs & imaging results that were available during my care of the patient were reviewed by me and considered in my medical decision making (see chart for details).     Patient presents to ED for evaluation of weakness.  Per family, patient had been leaning towards the right  2 days ago while visiting family members in the [place].  She also had a shuffling, unsteady gait at that time.  Symptoms resolved that day.  However, staff at the assisted living facility today noticed the symptoms approximately 11 hours prior to her arrival.  She has had contracture of her head to the right side for the past several months.  She has a history of Alzheimer's dementia and possible early Parkinson's disease.  She is recently started on Sinemet and Lamictal for possible seizure-like activity on EEG done last week.  On physical exam no facial asymmetry was noted.  She has equal strength bilaterally in upper and lower extremities.  She is alert and oriented x2 which is per her baseline.  EKG with no ischemic changes. Troponin negative. CBC, BMP unremarkable.  CT of the head unremarkable.  Chest x-ray negative.  Urinalysis and MRI of the brain pending.  Will dispo according to MRI findings.  I anticipate disposition home if MRI is negative, outpatient follow-up. Care handed off to oncoming provider, L. Baird Cancer, PA-C, pending remainder of labwork. Patient discussed with and seen by my attending, Dr. Gilford Raid.  Portions of this note were generated with [employer]. Dictation errors may occur despite best attempts at proofreading.   Final Clinical Impressions(s) / ED Diagnoses   Final diagnoses:    None    ED Discharge Orders    None       Delia Heady, PA-C 03/08/18 0045    Isla Pence, MD 03/08/18 [phone]

## 2018-03-07 NOTE — ED Notes (Signed)
GCS 14. Pt able to follow commands. Pt has weakness, unsteady gait, moderate head control. Speech per baseline. Pt has tremors, family states is new.

## 2018-03-07 NOTE — Telephone Encounter (Signed)
I talk with the daughter.  The patient is leaning to the right with walking, this was seen on her last examination.  The patient has a tendency to fall, we will send in a prescription for a rolling walker.  We will fax it to her extended care facility, fax [phone].

## 2018-03-07 NOTE — ED Notes (Signed)
Patient transported to CT 

## 2018-03-07 NOTE — Telephone Encounter (Signed)
Pt's daughter called, she is concerned with new onset of leaning to the right side. They went to [place] over the weekend, Saturday she needed help with walking. Daughter is concerned if the pt walks like this she will fall, states she does not have a walker or wheelchair. Please call to advise

## 2018-03-08 ENCOUNTER — Encounter: Payer: Self-pay | Admitting: Neurology

## 2018-03-08 ENCOUNTER — Emergency Department (HOSPITAL_COMMUNITY): Payer: Medicare Other

## 2018-03-08 DIAGNOSIS — R269 Unspecified abnormalities of gait and mobility: Secondary | ICD-10-CM | POA: Diagnosis not present

## 2018-03-08 DIAGNOSIS — R531 Weakness: Secondary | ICD-10-CM | POA: Diagnosis not present

## 2018-03-08 LAB — URINALYSIS, ROUTINE W REFLEX MICROSCOPIC
BILIRUBIN URINE: NEGATIVE
Glucose, UA: NEGATIVE mg/dL
Hgb urine dipstick: NEGATIVE
Ketones, ur: NEGATIVE mg/dL
LEUKOCYTES UA: NEGATIVE
NITRITE: NEGATIVE
Protein, ur: NEGATIVE mg/dL
Specific Gravity, Urine: 1.005 (ref 1.005–1.030)
pH: 6 (ref 5.0–8.0)

## 2018-03-08 NOTE — Discharge Instructions (Signed)
Work-up today was reassuring. Monitor for any acute changes in symptoms, frequent checks ideal. Follow-up with your primary care doctor and/or your neurologist. Return to the ED for new or worsening symptoms.

## 2018-03-08 NOTE — ED Provider Notes (Signed)
Assumed care from [place] at shift change.  See prior notes for full H&P.  Briefly, 77 y.o. Here with weakness.  Has been started on sinemet and lamictal for possible seizures.  Also had episode of "spacing out" last week.  Work up thus far is reassuring including labs, CT head, and CXR.  Daughter very upset and tearful about patient's condition, is worried she may have had a stroke.  Plan:  UA and MRI pending.  Results for orders placed or performed during the hospital encounter of 93/81/82  Basic metabolic panel  Result Value Ref Range   Sodium 135 135 - 145 mmol/L   Potassium 3.7 3.5 - 5.1 mmol/L   Chloride 104 101 - 111 mmol/L   CO2 22 22 - 32 mmol/L   Glucose, Bld 79 65 - 99 mg/dL   BUN 12 6 - 20 mg/dL   Creatinine, Ser 0.93 0.44 - 1.00 mg/dL   Calcium 8.8 (L) 8.9 - 10.3 mg/dL   GFR calc non Af Amer 59 (L) >60 mL/min   GFR calc Af Amer >60 >60 mL/min   Anion gap 9 5 - 15  CBC with Differential  Result Value Ref Range   WBC 8.4 4.0 - 10.5 K/uL   RBC 3.52 (L) 3.87 - 5.11 MIL/uL   Hemoglobin 11.4 (L) 12.0 - 15.0 g/dL   HCT 35.4 (L) 36.0 - 46.0 %   MCV 100.6 (H) 78.0 - 100.0 fL   MCH 32.4 26.0 - 34.0 pg   MCHC 32.2 30.0 - 36.0 g/dL   RDW 13.6 11.5 - 15.5 %   Platelets 212 150 - 400 K/uL   Neutrophils Relative % 54 %   Neutro Abs 4.5 1.7 - 7.7 K/uL   Lymphocytes Relative 34 %   Lymphs Abs 2.9 0.7 - 4.0 K/uL   Monocytes Relative 10 %   Monocytes Absolute 0.8 0.1 - 1.0 K/uL   Eosinophils Relative 2 %   Eosinophils Absolute 0.2 0.0 - 0.7 K/uL   Basophils Relative 0 %   Basophils Absolute 0.0 0.0 - 0.1 K/uL  Urinalysis, Routine w reflex microscopic  Result Value Ref Range   Color, Urine STRAW (A) YELLOW   APPearance CLEAR CLEAR   Specific Gravity, Urine 1.005 1.005 - 1.030   pH 6.0 5.0 - 8.0   Glucose, UA NEGATIVE NEGATIVE mg/dL   Hgb urine dipstick NEGATIVE NEGATIVE   Bilirubin Urine NEGATIVE NEGATIVE   Ketones, ur NEGATIVE NEGATIVE mg/dL   Protein, ur NEGATIVE  NEGATIVE mg/dL   Nitrite NEGATIVE NEGATIVE   Leukocytes, UA NEGATIVE NEGATIVE  I-stat troponin, ED  Result Value Ref Range   Troponin i, poc 0.00 0.00 - 0.08 ng/mL   Comment 3           Dg Chest 2 View  Result Date: 03/07/2018 CLINICAL DATA:  Weakness EXAM: CHEST - 2 VIEW COMPARISON:  01/04/2017 FINDINGS: The heart size and mediastinal contours are within normal limits. Both lungs are clear. The visualized skeletal structures are unremarkable. Calcified nodule at the left lung base. IMPRESSION: No active cardiopulmonary disease. Electronically Signed   By: Donavan Foil M.D.   On: 03/07/2018 23:07   Ct Head Wo Contrast  Result Date: 03/07/2018 CLINICAL DATA:  Altered level of consciousness. Right-sided weakness. EXAM: CT HEAD WITHOUT CONTRAST TECHNIQUE: Contiguous axial images were obtained from the base of the skull through the vertex without intravenous contrast. COMPARISON:  Head CT 01/07/2018 FINDINGS: Brain: Stable degree of atrophy and chronic small vessel ischemia.  Stable mild asymmetric enlargement of the right lateral ventricle compared to left. No intracranial hemorrhage, mass effect, or midline shift. No hydrocephalus. The basilar cisterns are patent. No evidence of territorial infarct or acute ischemia. No extra-axial or intracranial fluid collection. Vascular: Atherosclerosis of skullbase vasculature without hyperdense vessel or abnormal calcification. Skull: No fracture or focal lesion. Sinuses/Orbits: Small mucous retention cyst in the right maxillary sinus. No sinus fluid level or acute finding. Other: None. IMPRESSION: 1.  No acute intracranial abnormality. 2. Unchanged cerebral atrophy and chronic small vessel ischemia from prior exam. Electronically Signed   By: Jeb Levering M.D.   On: 03/07/2018 23:04   Mr Brain Wo Contrast  Result Date: 03/08/2018 CLINICAL DATA:  77 y/o F; episodes of shuffling gait and leaning towards the right. History of Alzheimer's dementia, hypertension,  hyperlipidemia, essential tremor. EXAM: MRI HEAD WITHOUT CONTRAST TECHNIQUE: Multiplanar, multiecho pulse sequences of the brain and surrounding structures were obtained without intravenous contrast. COMPARISON:  03/07/2018 CT head.  02/09/2016 MRI head. FINDINGS: Brain: Motion degradation on multiple sequences. No acute infarction, hemorrhage, hydrocephalus, extra-axial collection or mass lesion. Severalnonspecific foci of T2 FLAIR hyperintense signal abnormality in subcortical and periventricular white matter are compatible withmildchronic microvascular ischemic changes for age. Moderatebrain parenchymal volume loss. Stable asymmetric enlargement of the right lateral ventricle. Vascular: Normal flow voids. Skull and upper cervical spine: Normal marrow signal. Sinuses/Orbits: Small right maxillary sinus mucous retention cyst. Bilateral intra-ocular lens replacement. Otherwise negative. Other: None. IMPRESSION: 1. No acute intracranial abnormality identified. 2. Stable mild chronic microvascular ischemic changes and moderate parenchymal volume loss of the brain. Electronically Signed   By: Kristine Garbe M.D.   On: 03/08/2018 02:48    MRI negative for acute findings.  UA without signs of infection.  Patient can be discharged home.  Will have her follow-up closely with her PCP and/or neurologist.  Discussed plan with patient's daughter, acknowledged understanding and agreed with plan of care.  Return precautions given for new or worsening symptoms.   Larene Pickett, PA-C 03/08/18 Rowe Pavy, April, MD 03/08/18 [id number]

## 2018-03-08 NOTE — ED Notes (Signed)
Report given to Robin at [facility] ([facility]), pt taken to daughters car via wheelchair. Daughter has d/c instructions. Pt stable.

## 2018-03-10 DIAGNOSIS — Z9181 History of falling: Secondary | ICD-10-CM | POA: Diagnosis not present

## 2018-03-10 DIAGNOSIS — G309 Alzheimer's disease, unspecified: Secondary | ICD-10-CM | POA: Diagnosis not present

## 2018-03-10 DIAGNOSIS — F05 Delirium due to known physiological condition: Secondary | ICD-10-CM | POA: Diagnosis not present

## 2018-03-10 DIAGNOSIS — I10 Essential (primary) hypertension: Secondary | ICD-10-CM | POA: Diagnosis not present

## 2018-03-10 DIAGNOSIS — G20 Parkinson's disease: Secondary | ICD-10-CM | POA: Diagnosis not present

## 2018-03-10 DIAGNOSIS — F0281 Dementia in other diseases classified elsewhere with behavioral disturbance: Secondary | ICD-10-CM | POA: Diagnosis not present

## 2018-03-16 DIAGNOSIS — Z9181 History of falling: Secondary | ICD-10-CM | POA: Diagnosis not present

## 2018-03-16 DIAGNOSIS — I10 Essential (primary) hypertension: Secondary | ICD-10-CM | POA: Diagnosis not present

## 2018-03-16 DIAGNOSIS — G20 Parkinson's disease: Secondary | ICD-10-CM | POA: Diagnosis not present

## 2018-03-16 DIAGNOSIS — F0281 Dementia in other diseases classified elsewhere with behavioral disturbance: Secondary | ICD-10-CM | POA: Diagnosis not present

## 2018-03-16 DIAGNOSIS — F05 Delirium due to known physiological condition: Secondary | ICD-10-CM | POA: Diagnosis not present

## 2018-03-16 DIAGNOSIS — G309 Alzheimer's disease, unspecified: Secondary | ICD-10-CM | POA: Diagnosis not present

## 2018-03-18 DIAGNOSIS — F0281 Dementia in other diseases classified elsewhere with behavioral disturbance: Secondary | ICD-10-CM | POA: Diagnosis not present

## 2018-03-18 DIAGNOSIS — G309 Alzheimer's disease, unspecified: Secondary | ICD-10-CM | POA: Diagnosis not present

## 2018-03-18 DIAGNOSIS — G20 Parkinson's disease: Secondary | ICD-10-CM | POA: Diagnosis not present

## 2018-03-18 DIAGNOSIS — Z9181 History of falling: Secondary | ICD-10-CM | POA: Diagnosis not present

## 2018-03-18 DIAGNOSIS — F05 Delirium due to known physiological condition: Secondary | ICD-10-CM | POA: Diagnosis not present

## 2018-03-18 DIAGNOSIS — I10 Essential (primary) hypertension: Secondary | ICD-10-CM | POA: Diagnosis not present

## 2018-03-19 DIAGNOSIS — R300 Dysuria: Secondary | ICD-10-CM | POA: Diagnosis not present

## 2018-03-23 DIAGNOSIS — G20 Parkinson's disease: Secondary | ICD-10-CM | POA: Diagnosis not present

## 2018-03-23 DIAGNOSIS — Z9181 History of falling: Secondary | ICD-10-CM | POA: Diagnosis not present

## 2018-03-23 DIAGNOSIS — F05 Delirium due to known physiological condition: Secondary | ICD-10-CM | POA: Diagnosis not present

## 2018-03-23 DIAGNOSIS — I10 Essential (primary) hypertension: Secondary | ICD-10-CM | POA: Diagnosis not present

## 2018-03-23 DIAGNOSIS — F0281 Dementia in other diseases classified elsewhere with behavioral disturbance: Secondary | ICD-10-CM | POA: Diagnosis not present

## 2018-03-23 DIAGNOSIS — G309 Alzheimer's disease, unspecified: Secondary | ICD-10-CM | POA: Diagnosis not present

## 2018-03-24 ENCOUNTER — Encounter: Payer: Self-pay | Admitting: Family Medicine

## 2018-03-25 DIAGNOSIS — F05 Delirium due to known physiological condition: Secondary | ICD-10-CM | POA: Diagnosis not present

## 2018-03-25 DIAGNOSIS — I10 Essential (primary) hypertension: Secondary | ICD-10-CM | POA: Diagnosis not present

## 2018-03-25 DIAGNOSIS — F0281 Dementia in other diseases classified elsewhere with behavioral disturbance: Secondary | ICD-10-CM | POA: Diagnosis not present

## 2018-03-25 DIAGNOSIS — G309 Alzheimer's disease, unspecified: Secondary | ICD-10-CM | POA: Diagnosis not present

## 2018-03-25 DIAGNOSIS — G20 Parkinson's disease: Secondary | ICD-10-CM | POA: Diagnosis not present

## 2018-03-25 DIAGNOSIS — Z9181 History of falling: Secondary | ICD-10-CM | POA: Diagnosis not present

## 2018-03-29 DIAGNOSIS — G20 Parkinson's disease: Secondary | ICD-10-CM | POA: Diagnosis not present

## 2018-03-29 DIAGNOSIS — I10 Essential (primary) hypertension: Secondary | ICD-10-CM | POA: Diagnosis not present

## 2018-03-29 DIAGNOSIS — F0281 Dementia in other diseases classified elsewhere with behavioral disturbance: Secondary | ICD-10-CM | POA: Diagnosis not present

## 2018-03-29 DIAGNOSIS — Z9181 History of falling: Secondary | ICD-10-CM | POA: Diagnosis not present

## 2018-03-29 DIAGNOSIS — F05 Delirium due to known physiological condition: Secondary | ICD-10-CM | POA: Diagnosis not present

## 2018-03-29 DIAGNOSIS — G309 Alzheimer's disease, unspecified: Secondary | ICD-10-CM | POA: Diagnosis not present

## 2018-03-31 ENCOUNTER — Ambulatory Visit: Payer: Medicare Other | Admitting: Family Medicine

## 2018-03-31 ENCOUNTER — Ambulatory Visit (INDEPENDENT_AMBULATORY_CARE_PROVIDER_SITE_OTHER): Payer: Medicare Other | Admitting: Family Medicine

## 2018-03-31 ENCOUNTER — Encounter: Payer: Self-pay | Admitting: Family Medicine

## 2018-03-31 VITALS — BP 130/68 | HR 54 | Temp 98.2°F | Resp 12 | Ht 62.0 in | Wt 115.0 lb

## 2018-03-31 DIAGNOSIS — G40909 Epilepsy, unspecified, not intractable, without status epilepticus: Secondary | ICD-10-CM | POA: Diagnosis not present

## 2018-03-31 DIAGNOSIS — G20 Parkinson's disease: Secondary | ICD-10-CM

## 2018-03-31 DIAGNOSIS — G309 Alzheimer's disease, unspecified: Secondary | ICD-10-CM | POA: Diagnosis not present

## 2018-03-31 DIAGNOSIS — F0281 Dementia in other diseases classified elsewhere with behavioral disturbance: Secondary | ICD-10-CM

## 2018-04-01 NOTE — Progress Notes (Signed)
Subjective:    Patient ID: Theresa Sloan, female    DOB: [birth year], 77 y.o.   MRN: [id number] 09/2017 Patient's daughter is going to have the patient move into a skilled nursing facility permanently.  This is scheduled for later this week.  This is mainly due to worsening dementia.  Patient does not even speak today during our encounter.  She has significantly worsening tremor.  She also demonstrates slow shuffling gait and almost parkinsonian-like features.  She has had an essential tremor for many many years however she has now developed a flat affect/masklike face ease, diminished blink reflex, and the pill-rolling tremor which are new.  I am not certain if the parkinsonian features are secondary to the Abilify or possibly represent a new problem.  Daughter is concerned because the patient complained of a sore throat yesterday and some right-sided otalgia however her exam today is completely normal.  At that time, my plan was: No evidence of a serious bacterial infection.  The sore throat could represent an upper respiratory infection however her physical exam today is essentially normal aside from the worsening tremor, and the neurologic symptoms mentioned above.  She will be moving into a skilled nursing facility later this week so I hesitate to discontinue the Abilify during a period of time her delirium is likely going to happen due to disorientation.  Therefore I will make no changes.  However once the patient is stable in her new living arrangement, I would like to try to wean her off the Abilify to see if her movement problems will improve.  Patient's daughter agrees.  12/31/17 Doing well in SNF.  Daughter reports decreasing appetite however weight is stable. No further delirium or combativeness.  However, daughter is concerned about increasing sedation and shuffling gait.  On abilify 10 mg poqhs.  At that time, my plan was: Decrease abilify to 5 mg poqhs and rehceck in 3 months.  Add ensure  1 can poq supper.  If appetite worsens add remeron 30 mg poqhs.  Continue to try to wean off abilify.     04/01/18 Unfortunately since I last saw the patient, her condition has deteriorated.  Her shuffling gait worsened.  She has developed a masslike affect.  She is more unsteady on her feet.  Her essential tremor has developed into a pill-rolling tremor.  Symptoms that were present in November have only worsened.  Recently, her neurologist discontinued Abilify.  Patient has been off the medication now for more than 3 weeks however her symptoms have not improved.  As a result she has been started on Sinemet 3 times daily.  Since starting the medication, daughter denies any hallucinations or sundowning.  There have been no episodes of aggressive behavior or delirium.  Unfortunately she did recently have a staring episode.  She was unresponsive for several minutes and was drooling.  Neurology is concerned about partial seizures and has started her on Lamictal.  Daughter has not witnessed any further seizure activity since starting Lamictal.  MRI recently obtained did not reveal any stroke or acute insult to the basal ganglia. Past Medical History:  Diagnosis Date  . Alzheimer disease   . Anxiety   . Dementia   . 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 Gait difficulty 03/05/2015  . GERD (gastroesophageal reflux disease)   . Hyperlipidemia   . Hypertension   . Memory difficulty 08/07/2014  . TMJ (dislocation of temporomandibular joint)    Past Surgical History:  Procedure Laterality Date  . CESAREAN SECTION  1992  . RETINAL TEAR REPAIR CRYOTHERAPY     Current Outpatient Medications on File Prior to Visit  Medication Sig Dispense Refill  . acetaminophen (TYLENOL 8 HOUR) 650 MG CR tablet Take 650 mg by mouth every 6 (six) hours as needed for pain.    Marland Kitchen acetaminophen (TYLENOL) 500 MG tablet Take 500 mg by mouth daily.    Marland Kitchen atorvastatin (LIPITOR) 80 MG tablet TAKE ONE (1) TABLET BY  MOUTH EVERY DAY WITH BREAKFAST 90 tablet 1  . carbidopa-levodopa (SINEMET IR) 25-100 MG tablet Take 1 tablet by mouth 3 (three) times daily. (Patient taking differently: Take 0.5 tablets by mouth 3 (three) times daily. ) 90 tablet 2  . ENSURE (ENSURE) Take 237 mLs by mouth every evening.    . escitalopram (LEXAPRO) 20 MG tablet TAKE ONE (1) TABLET BY MOUTH EVERY DAY 90 tablet 3  . lamoTRIgine (LAMICTAL) 25 MG tablet 1 tablet twice daily for 2 weeks, then take 2 tablets twice daily for 2 weeks, then take 3 tablets twice daily (Patient taking differently: Take 25-75 mg by mouth See admin instructions. 1 tablet twice daily for 2 weeks, then take 2 tablets twice daily for 2 weeks, then take 3 tablets twice daily) 180 tablet 3  . memantine (NAMENDA) 10 MG tablet Take 1 tablet (10 mg total) by mouth 2 (two) times daily. 60 tablet 11  . Multiple Vitamins-Minerals (PRESERVISION AREDS 2 PO) Take 1 capsule by mouth 2 (two) times daily.     Marland Kitchen omeprazole (PRILOSEC) 20 MG capsule Take 1 capsule (20 mg total) by mouth daily. 90 capsule 4  . propranolol ER (INDERAL LA) 60 MG 24 hr capsule TAKE ONE (1) CAPSULE EACH DAY 90 capsule 1  . rivastigmine (EXELON) 9.5 mg/24hr APPLY 1 PATCH TO THE SKIN DAILY 30 patch 5   No current facility-administered medications on file prior to visit.    No Known Allergies Social History   Socioeconomic History  . Marital status: Widowed    Spouse name: Not on file  . Number of children: 2  . Years of education: 56 th  . Highest education level: Not on file  Occupational History  . Occupation: retired  Scientific laboratory technician  . Financial resource strain: Not on file  . Food insecurity:    Worry: Not on file    Inability: Not on file  . Transportation needs:    Medical: Not on file    Non-medical: Not on file  Tobacco Use  . Smoking status: Never Smoker  . Smokeless tobacco: Never Used  Substance and Sexual Activity  . Alcohol use: No  . Drug use: No  . Sexual activity: Not on  file  Lifestyle  . Physical activity:    Days per week: Not on file    Minutes per session: Not on file  . Stress: Not on file  Relationships  . Social connections:    Talks on phone: Not on file    Gets together: Not on file    Attends religious service: Not on file    Active member of club or organization: Not on file    Attends meetings of clubs or organizations: Not on file    Relationship status: Not on file  . Intimate partner violence:    Fear of current or ex partner: Not on file    Emotionally abused: Not on file    Physically abused: Not on file    Forced sexual  activity: Not on file  Other Topics Concern  . Not on file  Social History Narrative   Living with daughter since July 2018   Patient is right handed.   Patient does not drink caffeine.      Review of Systems  All other systems reviewed and are negative.      Objective:   Physical Exam  Constitutional: She appears well-developed and well-nourished. She has a sickly appearance.  HENT:  Right Ear: Tympanic membrane, external ear and ear canal normal.  Left Ear: Tympanic membrane, external ear and ear canal normal.  Nose: Nose normal.  Mouth/Throat: Oropharynx is clear and moist. No oropharyngeal exudate.  Cardiovascular: Normal rate, regular rhythm, normal heart sounds and intact distal pulses.  No murmur heard. Pulmonary/Chest: Effort normal and breath sounds normal. No respiratory distress. She has no wheezes. She has no rales.  Abdominal: Soft. Bowel sounds are normal. She exhibits no distension. There is no tenderness. There is no rebound and no guarding.  Musculoskeletal: She exhibits no edema.       Cervical back: She exhibits normal range of motion, no tenderness, no bony tenderness, no deformity, no pain and no spasm.  Neurological: She is disoriented. She displays tremor. No cranial nerve deficit or sensory deficit. She exhibits normal muscle tone. Gait abnormal.  Vitals reviewed.          Assessment & Plan:  Alzheimer's dementia with behavioral disturbance, unspecified timing of dementia onset  Parkinson disease ([place])  Seizure disorder ([place])  I do not believe the patient has drug-induced Parkinson's.  I certainly agree the discontinuing Abilify is in her best interest however she has been off the medication now for more than 3 weeks and symptoms of not improved.  Her physical exam findings are concerning for Parkinson's disease.  I have recommended that the daughter follow-up as planned with neurology to discuss up titrating Sinemet versus adding additional medication such as Mirapex or amantadine.  I have deferred to neurology's expert opinion.  Currently on antiepileptic medication and seizure-free in the short-term.  Continue to monitor this.  Alzheimer's disease appears to be stable however the patient's overall medical condition has certainly deteriorated.  Patient has seen a significant decline in function over the last 6 months.  Long-term prognosis is guarded

## 2018-04-06 ENCOUNTER — Telehealth: Payer: Self-pay | Admitting: *Deleted

## 2018-04-06 DIAGNOSIS — I10 Essential (primary) hypertension: Secondary | ICD-10-CM | POA: Diagnosis not present

## 2018-04-06 DIAGNOSIS — G20 Parkinson's disease: Secondary | ICD-10-CM | POA: Diagnosis not present

## 2018-04-06 DIAGNOSIS — F05 Delirium due to known physiological condition: Secondary | ICD-10-CM | POA: Diagnosis not present

## 2018-04-06 DIAGNOSIS — G309 Alzheimer's disease, unspecified: Secondary | ICD-10-CM | POA: Diagnosis not present

## 2018-04-06 DIAGNOSIS — Z9181 History of falling: Secondary | ICD-10-CM | POA: Diagnosis not present

## 2018-04-06 DIAGNOSIS — F0281 Dementia in other diseases classified elsewhere with behavioral disturbance: Secondary | ICD-10-CM | POA: Diagnosis not present

## 2018-04-06 NOTE — Telephone Encounter (Signed)
The patient is felt to have parkinsonism, she was on Abilify, we requested that the Abilify be discontinued.  We have started Sinemet on her last revisit.  It appears that the patient is progressing more rapidly, we will need to see her back in the office sooner.

## 2018-04-06 NOTE — Telephone Encounter (Signed)
Took call from phone staff. Spoke w/ Sonia Baller (physical therapists) at [facility]. She has been working with pt for the last 3-4 weeks. She feels pt does present w/ features of PD. However, the past few weeks she has been progressing very quickly. Having more cognitive deficits, difficulty ambulating. She also talked w/ daughter today who agreed there has been a significant decline recently. She has a walker for pt but hard for pt to control. Sonia Baller also contacted PCP who felt they needed to contact our office. She is requesting if patient needs to f/u with [place]. I advised Dr. Jannifer Franklin out of the office today but will be back tomorrow. There are no current openings to schedule for this week. Will check with MD tomorrow when he returns to office to advise. I will contact daughter and her to let her know. She verbalized understanding.

## 2018-04-07 ENCOUNTER — Encounter: Payer: Self-pay | Admitting: Neurology

## 2018-04-07 NOTE — Telephone Encounter (Signed)
I called the nurse, the patient seemed to get worse with her tremors in her ability to walk after an increase on the Lamictal to 75 mg twice daily, we will drop the dose back to 50 mg twice daily for now.  The patient will be seen in the office for an evaluation.

## 2018-04-07 NOTE — Telephone Encounter (Signed)
Replied to [employer] from daughter offering work in visit on 04/19/18 at 40am. Waiting on response.

## 2018-04-07 NOTE — Telephone Encounter (Signed)
Tiffany RN with [facility] called wanting to inform Dr. Jannifer Franklin that on 5/3 they increased the pts Lamictal to 75MG  twice a day. Then starting yesterday the pts tremors have worsened making it exteremly difficult for the pt to walk. Tiffany states all vitals are normal. Tiffany can be reached at [phone]

## 2018-04-08 ENCOUNTER — Emergency Department
Admission: EM | Admit: 2018-04-08 | Discharge: 2018-04-08 | Disposition: A | Discharge status: Home / Self Care | Payer: Medicare Other | Attending: Emergency Medicine | Admitting: Emergency Medicine

## 2018-04-08 ENCOUNTER — Telehealth: Payer: Self-pay | Admitting: Family Medicine

## 2018-04-08 ENCOUNTER — Emergency Department: Payer: Medicare Other

## 2018-04-08 ENCOUNTER — Other Ambulatory Visit: Payer: Self-pay

## 2018-04-08 DIAGNOSIS — G20 Parkinson's disease: Secondary | ICD-10-CM | POA: Diagnosis not present

## 2018-04-08 DIAGNOSIS — S0993XA Unspecified injury of face, initial encounter: Secondary | ICD-10-CM | POA: Diagnosis not present

## 2018-04-08 DIAGNOSIS — G309 Alzheimer's disease, unspecified: Secondary | ICD-10-CM | POA: Diagnosis not present

## 2018-04-08 DIAGNOSIS — Y999 Unspecified external cause status: Secondary | ICD-10-CM | POA: Insufficient documentation

## 2018-04-08 DIAGNOSIS — S022XXA Fracture of nasal bones, initial encounter for closed fracture: Secondary | ICD-10-CM | POA: Diagnosis not present

## 2018-04-08 DIAGNOSIS — Z79899 Other long term (current) drug therapy: Secondary | ICD-10-CM | POA: Diagnosis not present

## 2018-04-08 DIAGNOSIS — S0990XA Unspecified injury of head, initial encounter: Secondary | ICD-10-CM | POA: Diagnosis not present

## 2018-04-08 DIAGNOSIS — N390 Urinary tract infection, site not specified: Secondary | ICD-10-CM | POA: Diagnosis not present

## 2018-04-08 DIAGNOSIS — Y939 Activity, unspecified: Secondary | ICD-10-CM | POA: Insufficient documentation

## 2018-04-08 DIAGNOSIS — Y92129 Unspecified place in nursing home as the place of occurrence of the external cause: Secondary | ICD-10-CM | POA: Diagnosis not present

## 2018-04-08 DIAGNOSIS — W19XXXA Unspecified fall, initial encounter: Secondary | ICD-10-CM | POA: Diagnosis not present

## 2018-04-08 DIAGNOSIS — I10 Essential (primary) hypertension: Secondary | ICD-10-CM | POA: Insufficient documentation

## 2018-04-08 DIAGNOSIS — S199XXA Unspecified injury of neck, initial encounter: Secondary | ICD-10-CM | POA: Diagnosis not present

## 2018-04-08 DIAGNOSIS — S299XXA Unspecified injury of thorax, initial encounter: Secondary | ICD-10-CM | POA: Diagnosis not present

## 2018-04-08 DIAGNOSIS — F0390 Unspecified dementia without behavioral disturbance: Secondary | ICD-10-CM | POA: Diagnosis not present

## 2018-04-08 LAB — COMPREHENSIVE METABOLIC PANEL
ALT: 21 U/L (ref 14–54)
AST: 34 U/L (ref 15–41)
Albumin: 3.8 g/dL (ref 3.5–5.0)
Alkaline Phosphatase: 64 U/L (ref 38–126)
Anion gap: 7 (ref 5–15)
BILIRUBIN TOTAL: 0.7 mg/dL (ref 0.3–1.2)
BUN: 16 mg/dL (ref 6–20)
CHLORIDE: 104 mmol/L (ref 101–111)
CO2: 26 mmol/L (ref 22–32)
Calcium: 9 mg/dL (ref 8.9–10.3)
Creatinine, Ser: 0.98 mg/dL (ref 0.44–1.00)
GFR, EST NON AFRICAN AMERICAN: 55 mL/min — AB (ref >60)
Glucose, Bld: 93 mg/dL (ref 65–99)
POTASSIUM: 3.8 mmol/L (ref 3.5–5.1)
Sodium: 137 mmol/L (ref 135–145)
TOTAL PROTEIN: 6.9 g/dL (ref 6.5–8.1)

## 2018-04-08 LAB — CBC WITH DIFFERENTIAL/PLATELET
BASOS ABS: 0.1 10*3/uL (ref 0–0.1)
Basophils Relative: 1 %
EOS ABS: 0.2 10*3/uL (ref 0–0.7)
Eosinophils Relative: 2 %
HCT: 38 % (ref 35.0–47.0)
HEMOGLOBIN: 12.9 g/dL (ref 12.0–16.0)
LYMPHS ABS: 2.4 10*3/uL (ref 1.0–3.6)
Lymphocytes Relative: 29 %
MCH: 34.4 pg — AB (ref 26.0–34.0)
MCHC: 34.1 g/dL (ref 32.0–36.0)
MCV: 101.1 fL — ABNORMAL HIGH (ref 80.0–100.0)
Monocytes Absolute: 0.7 10*3/uL (ref 0.2–0.9)
Monocytes Relative: 8 %
NEUTROS PCT: 60 %
Neutro Abs: 5 10*3/uL (ref 1.4–6.5)
Platelets: 242 10*3/uL (ref 150–440)
RBC: 3.76 MIL/uL — AB (ref 3.80–5.20)
RDW: 14.6 % — ABNORMAL HIGH (ref 11.5–14.5)
WBC: 8.4 10*3/uL (ref 3.6–11.0)

## 2018-04-08 LAB — TROPONIN I: Troponin I: <0.03 ng/mL (ref <0.03)

## 2018-04-08 MED ORDER — SODIUM CHLORIDE 0.9 % IV BOLUS
1000.0000 mL | Freq: Once | INTRAVENOUS | Status: AC
  Administered 2018-04-08: 1000 mL via INTRAVENOUS

## 2018-04-08 MED ORDER — CEPHALEXIN 500 MG PO CAPS
500.0000 mg | ORAL_CAPSULE | Freq: Once | ORAL | Status: AC
  Administered 2018-04-08: 500 mg via ORAL
  Filled 2018-04-08: qty 1

## 2018-04-08 MED ORDER — CEPHALEXIN 500 MG PO CAPS: 500.0000 mg | ORAL_CAPSULE | Freq: Three times a day (TID) | ORAL | 0 refills | Status: AC

## 2018-04-08 MED ORDER — ACETAMINOPHEN 500 MG PO TABS
1000.0000 mg | ORAL_TABLET | Freq: Once | ORAL | Status: AC
  Administered 2018-04-08: 1000 mg via ORAL

## 2018-04-08 MED ORDER — ACETAMINOPHEN 500 MG PO TABS
ORAL_TABLET | ORAL | Status: AC
  Filled 2018-04-08: qty 2

## 2018-04-08 NOTE — ED Triage Notes (Signed)
Pt arrived via EMS from [facility]. Pt was found on the floor after an appeared fall. Pt has hematoma to her left forehead and swelling to the bridge of her nose. Pt is also holding her left shoulder.

## 2018-04-08 NOTE — Telephone Encounter (Signed)
Pt daughter(on DPR) has called to inform Dr Jannifer Franklin pt is at [facility] due to a fall.  Pt daughter is asking for a call from Dr Jannifer Franklin

## 2018-04-08 NOTE — ED Provider Notes (Signed)
Signout from Dr. Rip Harbour in this 77 year old female with a history of parkinsonism.  Patient had a fall this morning.  Plan is follow-up with urinalysis as well as the CAT scan imaging.  Plan is for discharge as long as there is no admittable cause found on the imaging.  Also to follow-up with urinalysis.  Physical Exam  BP (!) 120/52 (BP Location: Right Arm)   Pulse 66   Temp (!) 97.4 F (36.3 C) (Axillary)   Resp 19   Ht 5\' 2"  (1.575 m)   Wt 54.4 kg (120 lb)   SpO2 99%   BMI 21.95 kg/m  ----------------------------------------- 8:58 AM on 04/08/2018 -----------------------------------------   Physical Exam Patient with right-sided frontal cephalohematoma.  Ecchymosis surrounding the right eye.  As well as swelling diffusely over the anterior nose.  However, there is no nasal septal hematoma. ED Course/Procedures     Procedures  MDM  CT imaging reveals a moderately displaced nasal bone fracture.  Urinalysis with catheterization revealed thick puslike urine.  Not enough was obtained to send for urinalysis but culture was sent.  The daughter says the patient has also recently been acting slightly more agitated.  We will treat the urine empirically based on the gross examination of the urine as well as the patient's behavioral disturbance.  However, the patient's daughter says that the patient is acting at her baseline at this time.  We discussed CT imaging including nasal bone fracture.  Patient to be discharged.       Orbie Pyo, MD 04/08/18 0900

## 2018-04-08 NOTE — ED Provider Notes (Addendum)
[facility] Emergency Department Provider Note   ____________________________________________   First MD Initiated Contact with Patient 04/08/18 [phone]     (approximate)  I have reviewed the triage vital signs and the nursing notes.   HISTORY  Chief Complaint Fall  history limited by dementia  HPI Theresa Sloan is a 77 y.o. female Patient reportedly found on the floor. They'll memory care unit. She has a bruise on her for head and some swelling on her nose. She was holding her shoulder but when I saw her she says her shoulders are not hurting are not tender to palpation she denies any other pain anywhere else. Her daughter comes in reports that she has Alzheimer's and Parkinson's. She does have an ounce resting tremor. Again patient denies any pain except for in her face.   Past Medical History:  Diagnosis Date  . Alzheimer disease   . Anxiety   . Dementia   . 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 Gait difficulty 03/05/2015  . GERD (gastroesophageal reflux disease)   . Hyperlipidemia   . Hypertension   . Memory difficulty 08/07/2014  . TMJ (dislocation of temporomandibular joint)     Patient Active Problem List   Diagnosis Date Noted  . Essential tremor 02/03/2016  . Subacute confusional state 02/03/2016  . Gait difficulty 03/05/2015  . Dementia   . Memory difficulty 08/07/2014  . 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 GERD (gastroesophageal reflux disease) 12/21/2013  . Insomnia 12/21/2013  . 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 Dry eyes 07/07/2012  . Epiretinal membrane (ERM) of left eye 07/07/2012  . 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 Pseudophakia of both eyes 07/07/2012    Past Surgical History:  Procedure Laterality Date  . CESAREAN SECTION  1992  . RETINAL TEAR REPAIR CRYOTHERAPY      Prior to Admission medications   Medication  Sig Start Date End Date Taking? Authorizing Provider  acetaminophen (TYLENOL 8 HOUR) 650 MG CR tablet Take 650 mg by mouth every 6 (six) hours as needed for pain.    [provider]  acetaminophen (TYLENOL) 500 MG tablet Take 500 mg by mouth daily.    [provider]  atorvastatin (LIPITOR) 80 MG tablet TAKE ONE (1) TABLET BY MOUTH EVERY DAY WITH BREAKFAST 10/25/17   [name], MD  carbidopa-levodopa (SINEMET IR) 25-100 MG tablet Take 1 tablet by mouth 3 (three) times daily. Patient taking differently: Take 0.5 tablets by mouth 3 (three) times daily.  03/04/18   Kathrynn Ducking, MD  ENSURE (ENSURE) Take 237 mLs by mouth every evening.    [provider]  escitalopram (LEXAPRO) 20 MG tablet TAKE ONE (1) TABLET BY MOUTH EVERY DAY 05/21/17   Kathrynn Ducking, MD  lamoTRIgine (LAMICTAL) 25 MG tablet 1 tablet twice daily for 2 weeks, then take 2 tablets twice daily for 2 weeks, then take 3 tablets twice daily Patient taking differently: Take 25-75 mg by mouth See admin instructions. 1 tablet twice daily for 2 weeks, then take 2 tablets twice daily for 2 weeks, then take 3 tablets twice daily 03/03/18   Kathrynn Ducking, MD  memantine (NAMENDA) 10 MG tablet Take 1 tablet (10 mg total) by mouth 2 (two) times daily. 09/02/17   Ward Givens, NP  Multiple Vitamins-Minerals (PRESERVISION AREDS 2 PO) Take 1 capsule by mouth 2 (two) times daily.  [provider]  omeprazole (PRILOSEC) 20 MG capsule Take 1 capsule (20 mg total) by mouth daily. 10/13/16   [name], MD  propranolol ER (INDERAL LA) 60 MG 24 hr capsule TAKE ONE (1) CAPSULE EACH DAY 08/09/17   Kathrynn Ducking, MD  rivastigmine (EXELON) 9.5 mg/24hr APPLY 1 PATCH TO THE SKIN DAILY 09/02/17   Ward Givens, NP    Allergies Patient has no known allergies.  Family History  Problem Relation Age of Onset  . Hypertension Sister   . Tremor Sister     Social History Social History   Tobacco  Use  . Smoking status: Never Smoker  . Smokeless tobacco: Never Used  Substance Use Topics  . Alcohol use: No  . Drug use: No    Review of Systems  unable to get a good review of systems ____________________________________________   PHYSICAL EXAM:  VITAL SIGNS: ED Triage Vitals  Enc Vitals Group     BP 04/08/18 0638 (!) 120/52     Pulse Rate 04/08/18 0638 66     Resp 04/08/18 0638 19     Temp 04/08/18 0638 (!) 97.4 F (36.3 C)     Temp Source 04/08/18 0638 Axillary     SpO2 04/08/18 0638 99 %     Weight 04/08/18 0640 120 lb (54.4 kg)     Height 04/08/18 0640 5\' 2"  (1.575 m)     Head Circumference --      Peak Flow --      Pain Score --      Pain Loc --      Pain Edu? --      Excl. in [place]? --     Constitutional: Alert Well appearing and in no acute distress. Eyes: Conjunctivae are normal.  Head: bruise on the forehead and bridge of nose Nose: No congestion/rhinnorhea. Mouth/Throat: Mucous membranes are moist.  Oropharynx non-erythematous. Neck: No stridor. no apparent neck tenderness Cardiovascular: Normal rate, regular rhythm. Grossly normal heart sounds.  Good peripheral circulation.no chest tenderness Respiratory: Normal respiratory effort.  No retractions. Lungs CTAB. Gastrointestinal: Soft and nontender. No distention. No abdominal bruits. No CVA tenderness. Musculoskeletal: No lower extremity tenderness no arm tenderness Neurologic:  Normal speech and language. No gross focal neurologic deficits are appreciated. No gait instability. Skin:  Skin is warm, dry and intact. No rash noted.   ____________________________________________   LABS (all labs ordered are listed, but only abnormal results are displayed)  Labs Reviewed  COMPREHENSIVE METABOLIC PANEL - Abnormal; Notable for the following components:      Result Value   GFR calc non Af Amer 55 (*)    All other components within normal limits  CBC WITH DIFFERENTIAL/PLATELET - Abnormal; Notable for the  following components:   RBC 3.76 (*)    MCV 101.1 (*)    MCH 34.4 (*)    RDW 14.6 (*)    All other components within normal limits  TROPONIN I  URINALYSIS, COMPLETE (UACMP) WITH MICROSCOPIC   ____________________________________________  EKG  EKG read and interpreted by me shows normal sinus rhythm rate of approximately 60 normal axis very very irregular baseline makes it very difficult to read I do not see any acute ST-T changes though ____________________________________________  RADIOLOGY  ED MD interpretation:    Official radiology report(s): No results found.  ____________________________________________   PROCEDURES  Procedure(s) performed:   Procedures  Critical Care performed:   ____________________________________________   INITIAL IMPRESSION / ASSESSMENT AND PLAN / ED COURSE  signed out to Dr Clearnce Hasten      ____________________________________________   FINAL CLINICAL IMPRESSION(S) / ED DIAGNOSES  Final diagnoses:  Fall, initial encounter     ED Discharge Orders    None       Note:  This document was prepared using Dragon voice recognition software and may include unintentional dictation errors.    Nena Polio, MD 04/08/18 3419    Nena Polio, MD 04/19/18 [phone]

## 2018-04-08 NOTE — Telephone Encounter (Signed)
kristen from [facility] calling to let you know that Theresa Sloan fell, she had a knot on her head and a bloody nose, [facility] did come to take her to the hospital [phone]

## 2018-04-08 NOTE — Telephone Encounter (Signed)
I called the daughter, left a message.  The patient went to the hospital after a fall today, it appears that she has "pus like urine", appears to have a severe bladder infection.  This is likely the etiology of her change in her clinical condition.  Once this is treated, she is likely to return to her baseline.  The daughter is to call me back if she has any further issues or questions.

## 2018-04-11 ENCOUNTER — Encounter: Payer: Self-pay | Admitting: Family Medicine

## 2018-04-11 DIAGNOSIS — Z9181 History of falling: Secondary | ICD-10-CM | POA: Diagnosis not present

## 2018-04-11 DIAGNOSIS — G20 Parkinson's disease: Secondary | ICD-10-CM | POA: Diagnosis not present

## 2018-04-11 DIAGNOSIS — F0281 Dementia in other diseases classified elsewhere with behavioral disturbance: Secondary | ICD-10-CM | POA: Diagnosis not present

## 2018-04-11 DIAGNOSIS — G309 Alzheimer's disease, unspecified: Secondary | ICD-10-CM | POA: Diagnosis not present

## 2018-04-11 DIAGNOSIS — F05 Delirium due to known physiological condition: Secondary | ICD-10-CM | POA: Diagnosis not present

## 2018-04-11 DIAGNOSIS — I10 Essential (primary) hypertension: Secondary | ICD-10-CM | POA: Diagnosis not present

## 2018-04-13 ENCOUNTER — Encounter: Payer: Self-pay | Admitting: *Deleted

## 2018-04-13 ENCOUNTER — Emergency Department
Admission: EM | Admit: 2018-04-13 | Discharge: 2018-04-13 | Disposition: A | Discharge status: Home / Self Care | Payer: Medicare Other | Attending: Emergency Medicine | Admitting: Emergency Medicine

## 2018-04-13 ENCOUNTER — Emergency Department: Payer: Medicare Other

## 2018-04-13 ENCOUNTER — Other Ambulatory Visit: Payer: Self-pay

## 2018-04-13 ENCOUNTER — Telehealth: Payer: Self-pay | Admitting: Neurology

## 2018-04-13 DIAGNOSIS — Y939 Activity, unspecified: Secondary | ICD-10-CM | POA: Diagnosis not present

## 2018-04-13 DIAGNOSIS — W1830XA Fall on same level, unspecified, initial encounter: Secondary | ICD-10-CM | POA: Insufficient documentation

## 2018-04-13 DIAGNOSIS — Y92129 Unspecified place in nursing home as the place of occurrence of the external cause: Secondary | ICD-10-CM | POA: Insufficient documentation

## 2018-04-13 DIAGNOSIS — G309 Alzheimer's disease, unspecified: Secondary | ICD-10-CM | POA: Diagnosis not present

## 2018-04-13 DIAGNOSIS — F0280 Dementia in other diseases classified elsewhere without behavioral disturbance: Secondary | ICD-10-CM | POA: Diagnosis not present

## 2018-04-13 DIAGNOSIS — I10 Essential (primary) hypertension: Secondary | ICD-10-CM | POA: Diagnosis not present

## 2018-04-13 DIAGNOSIS — S0003XA Contusion of scalp, initial encounter: Secondary | ICD-10-CM | POA: Insufficient documentation

## 2018-04-13 DIAGNOSIS — Y999 Unspecified external cause status: Secondary | ICD-10-CM | POA: Diagnosis not present

## 2018-04-13 DIAGNOSIS — S0990XA Unspecified injury of head, initial encounter: Secondary | ICD-10-CM | POA: Diagnosis not present

## 2018-04-13 DIAGNOSIS — Z79899 Other long term (current) drug therapy: Secondary | ICD-10-CM | POA: Insufficient documentation

## 2018-04-13 DIAGNOSIS — S098XXA Other specified injuries of head, initial encounter: Secondary | ICD-10-CM | POA: Diagnosis present

## 2018-04-13 DIAGNOSIS — W19XXXA Unspecified fall, initial encounter: Secondary | ICD-10-CM | POA: Diagnosis not present

## 2018-04-13 DIAGNOSIS — S0993XA Unspecified injury of face, initial encounter: Secondary | ICD-10-CM | POA: Diagnosis not present

## 2018-04-13 NOTE — Telephone Encounter (Signed)
I called the daughter.  The patient fell today, she leans significantly with her Parkinson's type symptoms, was using a walker but still fell.  She hit her head, she will be going to [facility], I will try to review the results of the evaluation.

## 2018-04-13 NOTE — ED Notes (Signed)
RN updated pt that MD would be updated on their concern of pts face and teeth. Report given to Lattie Haw, Therapist, sports.

## 2018-04-13 NOTE — ED Triage Notes (Signed)
Unwitnessed fall with hematoma to the back of the head. Older bruising noted around the right eye. Pt denies pain and has no neurologic changes noted. Pt is confused at baseline.

## 2018-04-13 NOTE — Telephone Encounter (Signed)
CT of the Brain and cervical is OK, no structural injury.   CT head and cervical 04/13/18:  IMPRESSION: CT HEAD:  1. No acute intracranial process. New RIGHT scalp hematoma, subacute RIGHT frontal scalp hematoma. No skull fracture. 2. Stable examination including moderate to severe parenchymal brain volume loss.  CT CERVICAL SPINE:  1. No acute fracture or malalignment. 2. Stable minimal grade 1 C2-3 and C7-T1 spondylolisthesis.

## 2018-04-13 NOTE — Telephone Encounter (Signed)
Pt's daughter called tearful said the pt fell at [facility] and hit her head. Pt is conscience, she is being rushed to [facility]. Maudie Mercury is asking for Dr Jannifer Franklin to call her please on her cell. She was calmer when we hung up.

## 2018-04-13 NOTE — Discharge Instructions (Addendum)
You were seen in the ER today after a fall.  The CT scans do not show any serious injuries.   You should be checked on as often as possible to ensure safety, at least every 15 minutes.   Your facility should ensure you have working call bells and emergency pull cords so that you can get assistance when needed and avoid falling again.

## 2018-04-13 NOTE — ED Provider Notes (Signed)
[facility] Emergency Department Provider Note  ____________________________________________  Time seen: Approximately 7:55 PM  I have reviewed the triage vital signs and the nursing notes.   HISTORY  Chief Complaint Fall  Level 5 Caveat: Portions of the History and Physical were unable to be obtained due to altered mental status from chronic dementia.   HPI Theresa Sloan is a 77 y.o. female sent to the ED after an unwitnessed fall at her [facility] assisted living memory care. She is recently been evaluated and found not to be needing skilled nursing care at this time according to the medical record. No history is available except that the patient had a fall last week and has old bruising and abrasions on the face over the right maxilla periorbital and nasal area. Today she has a new swelling on the back of her head on the right side. Patient denies any complaints. No unusual behavior or changes since falling.      Past Medical History:  Diagnosis Date  . Alzheimer disease   . Anxiety   . Dementia   . 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 Gait difficulty 03/05/2015  . GERD (gastroesophageal reflux disease)   . Hyperlipidemia   . Hypertension   . Memory difficulty 08/07/2014  . TMJ (dislocation of temporomandibular joint)      Patient Active Problem List   Diagnosis Date Noted  . Essential tremor 02/03/2016  . Subacute confusional state 02/03/2016  . Gait difficulty 03/05/2015  . Dementia   . Memory difficulty 08/07/2014  . 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 GERD (gastroesophageal reflux disease) 12/21/2013  . Insomnia 12/21/2013  . 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 Dry eyes 07/07/2012  . Epiretinal membrane (ERM) of left eye 07/07/2012  . 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 Pseudophakia of both eyes 07/07/2012     Past Surgical  History:  Procedure Laterality Date  . CESAREAN SECTION  1992  . RETINAL TEAR REPAIR CRYOTHERAPY       Prior to Admission medications   Medication Sig Start Date End Date Taking? Authorizing Provider  acetaminophen (TYLENOL 8 HOUR) 650 MG CR tablet Take 650 mg by mouth every 6 (six) hours as needed for pain.   Yes [provider]  acetaminophen (TYLENOL) 500 MG tablet Take 500 mg by mouth daily.   Yes [provider]  atorvastatin (LIPITOR) 80 MG tablet Take 80 mg by mouth every evening.    Yes [provider]  carbidopa-levodopa (SINEMET IR) 25-100 MG tablet Take 1 tablet by mouth 3 (three) times daily. 03/04/18  Yes Kathrynn Ducking, MD  cephALEXin (KEFLEX) 500 MG capsule Take 1 capsule (500 mg total) by mouth 3 (three) times daily for 10 days. 04/08/18 04/18/18 Yes Schaevitz, Randall An, MD  ENSURE (ENSURE) Take 237 mLs by mouth every evening.   Yes [provider]  escitalopram (LEXAPRO) 20 MG tablet TAKE ONE (1) TABLET BY MOUTH EVERY DAY 05/21/17  Yes Kathrynn Ducking, MD  lamoTRIgine (LAMICTAL) 25 MG tablet 1 tablet twice daily for 2 weeks, then take 2 tablets twice daily for 2 weeks, then take 3 tablets twice daily Patient taking differently: Take 50 mg by mouth 2 (two) times daily.  03/03/18  Yes Kathrynn Ducking, MD  Multiple Vitamins-Minerals (PRESERVISION AREDS 2 PO) Take 1 capsule by mouth 2 (two) times daily.    Yes [provider]  omeprazole (  PRILOSEC) 20 MG capsule Take 1 capsule (20 mg total) by mouth daily. 10/13/16  Yes [name], MD  propranolol ER (INDERAL LA) 60 MG 24 hr capsule TAKE ONE (1) CAPSULE EACH DAY 08/09/17  Yes Kathrynn Ducking, MD  rivastigmine (EXELON) 9.5 mg/24hr APPLY 1 PATCH TO THE SKIN DAILY 09/02/17  Yes Ward Givens, NP  memantine (NAMENDA) 10 MG tablet Take 1 tablet (10 mg total) by mouth 2 (two) times daily. 09/02/17   Ward Givens, NP     Allergies Patient has no known allergies.   Family  History  Problem Relation Age of Onset  . Hypertension Sister   . Tremor Sister     Social History Social History   Tobacco Use  . Smoking status: Never Smoker  . Smokeless tobacco: Never Used  Substance Use Topics  . Alcohol use: No  . Drug use: No   Review of Systems Level 5 Caveat: Portions of the History and Physical were unable to be obtained due to altered mental status.   ____________________________________________   PHYSICAL EXAM:  VITAL SIGNS: ED Triage Vitals  Enc Vitals Group     BP 04/13/18 1714 140/70     Pulse Rate 04/13/18 1714 (!) 55     Resp 04/13/18 1714 16     Temp 04/13/18 1714 98.2 F (36.8 C)     Temp Source 04/13/18 1714 Oral     SpO2 04/13/18 1714 98 %     Weight 04/13/18 1715 120 lb (54.4 kg)     Height 04/13/18 1715 5\' 2"  (1.575 m)     Head Circumference --      Peak Flow --      Pain Score --      Pain Loc --      Pain Edu? --      Excl. in [place]? --     Vital signs reviewed, nursing assessments reviewed.   Constitutional:   Alert and oriented to self only. Well appearing and in no distress. Eyes:   Conjunctivae are normal. EOMI. PERRL. ENT      Head:   Normocephalic with old bruising around the right orbit and maxilla. Swelling of the nasal bridge, abrasion on the tip of the nose.      Nose:   No congestion/rhinnorhea. no epistaxis or septal hematoma      Mouth/Throat:   MMM, no pharyngeal erythema. No peritonsillar mass. teeth are all firmly seated. No acute dental fractures. Jaw nontender      Neck:   No meningismus. Full ROM.no midline spinal tenderness Hematological/Lymphatic/Immunilogical:   No cervical lymphadenopathy. Cardiovascular:   RRR. Symmetric bilateral radial and DP pulses.  No murmurs.  Respiratory:   Normal respiratory effort without tachypnea/retractions. Breath sounds are clear and equal bilaterally. No wheezes/rales/rhonchi. Gastrointestinal:   Soft and nontender. Non distended. There is no CVA tenderness.  No  rebound, rigidity, or guarding.  Musculoskeletal:   Normal range of motion in all extremities. No joint effusions.  No lower extremity tenderness.  No edema. Neurologic:   Normal speech. baseline Motor grossly intact. No acute focal neurologic deficits are appreciated.  Skin:    Skin is warm, dry and intact. No rash noted.  No petechiae, purpura, or bullae.and no lacerations  ____________________________________________    LABS (pertinent positives/negatives) (all labs ordered are listed, but only abnormal results are displayed) Labs Reviewed - No data to display ____________________________________________   EKG    ____________________________________________    RADIOLOGY  Ct Head Wo  Contrast  Result Date: 04/13/2018 CLINICAL DATA:  Unwitnessed fall, posterior head hematoma. At baseline. History of dementia, gait abnormality, hypertension and hyperlipidemia. EXAM: CT HEAD WITHOUT CONTRAST CT CERVICAL SPINE WITHOUT CONTRAST TECHNIQUE: Multidetector CT imaging of the head and cervical spine was performed following the standard protocol without intravenous contrast. Multiplanar CT image reconstructions of the cervical spine were also generated. COMPARISON:  CT HEAD and cervical spine Apr 08, 2018 FINDINGS: CT HEAD FINDINGS BRAIN: No intraparenchymal hemorrhage, mass effect nor midline shift. Moderate to severe parenchymal brain volume loss. No hydrocephalus. Patchy supratentorial white matter hypodensities less than expected for patient's age, though non-specific are most compatible with chronic small vessel ischemic disease. No acute large vascular territory infarcts. No abnormal extra-axial fluid collections. Basal cisterns are patent. VASCULAR: Moderate calcific atherosclerosis of the carotid siphons. SKULL: No skull fracture. Moderate RIGHT greater than LEFT temporomandibular osteoarthrosis. Old RIGHT nasal bone fracture. Small residual RIGHT frontal scalp hematoma. Moderate RIGHT  parietal scalp hematoma without subcutaneous gas or radiopaque foreign bodies. SINUSES/ORBITS: The mastoid air-cells and included paranasal sinuses are well-aerated.The included ocular globes and orbital contents are non-suspicious. Status post bilateral ocular lens implants and LEFT scleral banding. OTHER: None. CT CERVICAL SPINE FINDINGS ALIGNMENT: Maintained lordosis. Stable minimal grade 1 C2-3 retrolisthesis and C7-T1 anterolisthesis. SKULL BASE AND VERTEBRAE: Cervical vertebral bodies and posterior elements are intact. Severe C4-5 disc height loss, moderate to severe C2-3, C5-6 and C6-7 with endplate sclerosis and marginal spurring compatible with degenerative discs, unchanged. No destructive bony lesions. SOFT TISSUES AND SPINAL CANAL: Nonacute. Moderate calcific atherosclerosis carotid bifurcations. DISC LEVELS: No significant osseous canal stenosis. Severe bilateral C3-4, RIGHT C4-5, bilateral C5-6 and LEFT C6-7 neural foraminal narrowing. UPPER CHEST: Lung apices are clear. OTHER: None. IMPRESSION: CT HEAD: 1. No acute intracranial process. New RIGHT scalp hematoma, subacute RIGHT frontal scalp hematoma. No skull fracture. 2. Stable examination including moderate to severe parenchymal brain volume loss. CT CERVICAL SPINE: 1. No acute fracture or malalignment. 2. Stable minimal grade 1 C2-3 and C7-T1 spondylolisthesis. Electronically Signed   By: Elon Alas M.D.   On: 04/13/2018 17:52   Ct Cervical Spine Wo Contrast  Result Date: 04/13/2018 CLINICAL DATA:  Unwitnessed fall, posterior head hematoma. At baseline. History of dementia, gait abnormality, hypertension and hyperlipidemia. EXAM: CT HEAD WITHOUT CONTRAST CT CERVICAL SPINE WITHOUT CONTRAST TECHNIQUE: Multidetector CT imaging of the head and cervical spine was performed following the standard protocol without intravenous contrast. Multiplanar CT image reconstructions of the cervical spine were also generated. COMPARISON:  CT HEAD and  cervical spine Apr 08, 2018 FINDINGS: CT HEAD FINDINGS BRAIN: No intraparenchymal hemorrhage, mass effect nor midline shift. Moderate to severe parenchymal brain volume loss. No hydrocephalus. Patchy supratentorial white matter hypodensities less than expected for patient's age, though non-specific are most compatible with chronic small vessel ischemic disease. No acute large vascular territory infarcts. No abnormal extra-axial fluid collections. Basal cisterns are patent. VASCULAR: Moderate calcific atherosclerosis of the carotid siphons. SKULL: No skull fracture. Moderate RIGHT greater than LEFT temporomandibular osteoarthrosis. Old RIGHT nasal bone fracture. Small residual RIGHT frontal scalp hematoma. Moderate RIGHT parietal scalp hematoma without subcutaneous gas or radiopaque foreign bodies. SINUSES/ORBITS: The mastoid air-cells and included paranasal sinuses are well-aerated.The included ocular globes and orbital contents are non-suspicious. Status post bilateral ocular lens implants and LEFT scleral banding. OTHER: None. CT CERVICAL SPINE FINDINGS ALIGNMENT: Maintained lordosis. Stable minimal grade 1 C2-3 retrolisthesis and C7-T1 anterolisthesis. SKULL BASE AND VERTEBRAE: Cervical vertebral bodies and posterior elements are  intact. Severe C4-5 disc height loss, moderate to severe C2-3, C5-6 and C6-7 with endplate sclerosis and marginal spurring compatible with degenerative discs, unchanged. No destructive bony lesions. SOFT TISSUES AND SPINAL CANAL: Nonacute. Moderate calcific atherosclerosis carotid bifurcations. DISC LEVELS: No significant osseous canal stenosis. Severe bilateral C3-4, RIGHT C4-5, bilateral C5-6 and LEFT C6-7 neural foraminal narrowing. UPPER CHEST: Lung apices are clear. OTHER: None. IMPRESSION: CT HEAD: 1. No acute intracranial process. New RIGHT scalp hematoma, subacute RIGHT frontal scalp hematoma. No skull fracture. 2. Stable examination including moderate to severe parenchymal brain  volume loss. CT CERVICAL SPINE: 1. No acute fracture or malalignment. 2. Stable minimal grade 1 C2-3 and C7-T1 spondylolisthesis. Electronically Signed   By: Elon Alas M.D.   On: 04/13/2018 17:52    ____________________________________________   PROCEDURES Procedures  ____________________________________________  DIFFERENTIAL DIAGNOSIS   intracranial hemorrhage, C-spine fracture, scalp hematoma  CLINICAL IMPRESSION / ASSESSMENT AND PLAN / ED COURSE  Pertinent labs & imaging results that were available during my care of the patient were reviewed by me and considered in my medical decision making (see chart for details).    patient well-appearing no acute distress, at baseline mental status. Vital signs unremarkable. Fall was clearly mechanical, patient has baseline ambulatory dysfunction and needs a walker at all times, but often forgets to use it as she did today. She's been recently evaluated and deemed not to need skilled nursing level care. I recommended to the patient's daughter that she continue following up with primary care and neurology for ongoing assessment of the patient's needs.       ____________________________________________   FINAL CLINICAL IMPRESSION(S) / ED DIAGNOSES    Final diagnoses:  Scalp hematoma, initial encounter  Alzheimer's dementia without behavioral disturbance, unspecified timing of dementia onset  Fall, initial encounter     ED Discharge Orders    None      Portions of this note were generated with dragon dictation software. Dictation errors may occur despite best attempts at proofreading.    Carrie Mew, MD 04/13/18 2011

## 2018-04-13 NOTE — ED Notes (Signed)
Patient transported to CT 

## 2018-04-13 NOTE — ED Notes (Signed)
Pt sitting in chair in exam room, dressed and ready for d/c; family at bedside; pt with no distress noted, no c/o voiced at present; family reports they will be transporting pt back to [place]; daughter voices good understanding of d/c instructions & f/u

## 2018-04-14 DIAGNOSIS — I10 Essential (primary) hypertension: Secondary | ICD-10-CM | POA: Diagnosis not present

## 2018-04-14 DIAGNOSIS — G309 Alzheimer's disease, unspecified: Secondary | ICD-10-CM | POA: Diagnosis not present

## 2018-04-14 DIAGNOSIS — G20 Parkinson's disease: Secondary | ICD-10-CM | POA: Diagnosis not present

## 2018-04-14 DIAGNOSIS — Z9181 History of falling: Secondary | ICD-10-CM | POA: Diagnosis not present

## 2018-04-14 DIAGNOSIS — F05 Delirium due to known physiological condition: Secondary | ICD-10-CM | POA: Diagnosis not present

## 2018-04-14 DIAGNOSIS — F0281 Dementia in other diseases classified elsewhere with behavioral disturbance: Secondary | ICD-10-CM | POA: Diagnosis not present

## 2018-04-14 NOTE — Telephone Encounter (Signed)
Jenni/[facility] [phone] called to advise pt has UTI but the progression is more pronounced and cognitive issues are worse today than the past 2 days. Question other neurological presentation. She is wanting to make Dr Jannifer Franklin aware her heart rate today is 48-50. Can call if there are any questions

## 2018-04-18 ENCOUNTER — Encounter: Payer: Self-pay | Admitting: Neurology

## 2018-04-19 ENCOUNTER — Telehealth: Payer: Self-pay | Admitting: Neurology

## 2018-04-19 DIAGNOSIS — Z9181 History of falling: Secondary | ICD-10-CM | POA: Diagnosis not present

## 2018-04-19 DIAGNOSIS — G20 Parkinson's disease: Secondary | ICD-10-CM | POA: Diagnosis not present

## 2018-04-19 DIAGNOSIS — G309 Alzheimer's disease, unspecified: Secondary | ICD-10-CM | POA: Diagnosis not present

## 2018-04-19 DIAGNOSIS — I10 Essential (primary) hypertension: Secondary | ICD-10-CM | POA: Diagnosis not present

## 2018-04-19 DIAGNOSIS — F05 Delirium due to known physiological condition: Secondary | ICD-10-CM | POA: Diagnosis not present

## 2018-04-19 DIAGNOSIS — F0281 Dementia in other diseases classified elsewhere with behavioral disturbance: Secondary | ICD-10-CM | POA: Diagnosis not present

## 2018-04-19 NOTE — Telephone Encounter (Signed)
Daughter called back. Declined 10am appt. Scheduled pt for 4pm, 04/20/18 check in 330pm instead

## 2018-04-19 NOTE — Telephone Encounter (Signed)
I called the daughter.  The patient is having ongoing problems with falling, she is doing this with some regularity.  She leans quite a bit to one side, she has a walker but does not use it consistently.  The patient is in an extended care facility.  They wake her up every 2 hours at night to go to the bathroom, she may not be getting adequate rest.  I will try to get her back in the office for an evaluation.  The patient may require a wheelchair but even then she is at risk for falls if she tries to get up and walk on her own.

## 2018-04-19 NOTE — Telephone Encounter (Signed)
Called and spoke w/ daughter Maudie Mercury (on [place]). Offered appt tomorrow at 10am. She is going to call nursing facility to see if this works and call back to let us know

## 2018-04-20 ENCOUNTER — Encounter: Payer: Self-pay | Admitting: Neurology

## 2018-04-20 ENCOUNTER — Telehealth: Payer: Self-pay | Admitting: Neurology

## 2018-04-20 ENCOUNTER — Ambulatory Visit (INDEPENDENT_AMBULATORY_CARE_PROVIDER_SITE_OTHER): Payer: Medicare Other | Admitting: Neurology

## 2018-04-20 VITALS — BP 130/70 | HR 67 | Wt 111.0 lb

## 2018-04-20 DIAGNOSIS — F0390 Unspecified dementia without behavioral disturbance: Secondary | ICD-10-CM | POA: Diagnosis not present

## 2018-04-20 DIAGNOSIS — R269 Unspecified abnormalities of gait and mobility: Secondary | ICD-10-CM | POA: Diagnosis not present

## 2018-04-20 DIAGNOSIS — R413 Other amnesia: Secondary | ICD-10-CM | POA: Diagnosis not present

## 2018-04-20 MED ORDER — LAMOTRIGINE 25 MG PO TABS
50.0000 mg | ORAL_TABLET | Freq: Two times a day (BID) | ORAL | Status: DC
Start: 2018-04-20 — End: 2018-10-27

## 2018-04-20 MED ORDER — CARBIDOPA-LEVODOPA-ENTACAPONE 25-100-200 MG PO TABS: 1.0000 | ORAL_TABLET | Freq: Three times a day (TID) | ORAL | 5 refills | Status: AC

## 2018-04-20 NOTE — Telephone Encounter (Signed)
I talked with Theresa Sloan.  She is to stop the Sinemet and start Stalevo in its place.

## 2018-04-20 NOTE — Patient Instructions (Signed)
We will stop the Sinemet and start Stalevo 100/25/200 taking one three times a day.

## 2018-04-20 NOTE — Progress Notes (Signed)
Reason for visit: Parkinson's disease, dementia  Theresa Sloan is an 77 y.o. female  History of present illness:  Theresa Sloan is a 77 year old right-handed white female with a history of a progressive dementing illness.  The patient had been on Abilify, this has since been stopped.  The patient has developed Parkinson's disease with bilateral upper extremity tremors, she is now leaning towards the right, she has significant gait instability.  The patient has fallen on 2 occasions recently, one on 10 May and again on 15 May necessitating emergency room visits.  The patient has developed tardive dyskinesia that is mild coming off of Abilify.  The patient is on Sinemet taking the 25/100 mg tablets, 1 tablet 3 times daily.  The patient is not having any hallucinations but she is having some increased confusion in the evening hours.  The daughter is concerned that the extended care facility is waking her up every 2 hours at night to go the bathroom.  The patient may not be getting enough sleep, she seems to function better when she rests better at night.  The patient has been told repeatedly to use her walker with ambulation, she commonly will try to walk without it.  All of her falls have been while not using the walker.  Past Medical History:  Diagnosis Date  . Alzheimer disease   . Anxiety   . Dementia   . 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 Gait difficulty 03/05/2015  . GERD (gastroesophageal reflux disease)   . Hyperlipidemia   . Hypertension   . Memory difficulty 08/07/2014  . TMJ (dislocation of temporomandibular joint)     Past Surgical History:  Procedure Laterality Date  . CESAREAN SECTION  1992  . RETINAL TEAR REPAIR CRYOTHERAPY      Family History  Problem Relation Age of Onset  . Hypertension Sister   . Tremor Sister     Social history:  reports that she has never smoked. She has never used smokeless tobacco. She reports that she does not drink  alcohol or use drugs.   No Known Allergies  Medications:  Prior to Admission medications   Medication Sig Start Date End Date Taking? Authorizing Provider  acetaminophen (TYLENOL 8 HOUR) 650 MG CR tablet Take 650 mg by mouth every 6 (six) hours as needed for pain.    [provider]  acetaminophen (TYLENOL) 500 MG tablet Take 500 mg by mouth daily.    [provider]  atorvastatin (LIPITOR) 80 MG tablet Take 80 mg by mouth every evening.     [provider]  carbidopa-levodopa (SINEMET IR) 25-100 MG tablet Take 1 tablet by mouth 3 (three) times daily. 03/04/18   Kathrynn Ducking, MD  ENSURE (ENSURE) Take 237 mLs by mouth every evening.    [provider]  escitalopram (LEXAPRO) 20 MG tablet TAKE ONE (1) TABLET BY MOUTH EVERY DAY 05/21/17   Kathrynn Ducking, MD  lamoTRIgine (LAMICTAL) 25 MG tablet 1 tablet twice daily for 2 weeks, then take 2 tablets twice daily for 2 weeks, then take 3 tablets twice daily Patient taking differently: Take 50 mg by mouth 2 (two) times daily.  03/03/18   Kathrynn Ducking, MD  memantine (NAMENDA) 10 MG tablet Take 1 tablet (10 mg total) by mouth 2 (two) times daily. 09/02/17   Ward Givens, NP  Multiple Vitamins-Minerals (PRESERVISION AREDS 2 PO) Take 1 capsule by mouth 2 (two) times daily.  [provider]  omeprazole (PRILOSEC) 20 MG capsule Take 1 capsule (20 mg total) by mouth daily. 10/13/16   [name], MD  propranolol ER (INDERAL LA) 60 MG 24 hr capsule TAKE ONE (1) CAPSULE EACH DAY 08/09/17   Kathrynn Ducking, MD  rivastigmine (EXELON) 9.5 mg/24hr APPLY 1 PATCH TO THE SKIN DAILY 09/02/17   Ward Givens, NP    ROS:  Out of a complete 14 system review of symptoms, the patient complains only of the following symptoms, and all other reviewed systems are negative.  Walking difficulty Memory loss, weakness, tremors Agitation, confusion, anxiety  Weight 111 lb (50.3 kg).  Physical Exam  General:  The patient is alert and cooperative at the time of the examination.  Skin: No significant peripheral edema is noted.   Neurologic Exam  Mental status: The patient is alert and oriented x 2 at the time of the examination (not oriented to date).   Cranial nerves: Facial symmetry is present. Speech is normal, no aphasia or dysarthria is noted. Extraocular movements are full. Visual fields are full.  Masking of the face is seen.  Motor: The patient has good strength in all 4 extremities.  Sensory examination: Soft touch sensation is symmetric on the face, arms, and legs.  Coordination: The patient has good finger-nose-finger and heel-to-shin bilaterally, with exception that she has severe apraxia with use of the left upper extremity.  Gait and station: The patient is able to stand up, she has a tendency to lean to the right.  She has some hesitation shuffling when making turns and initiation of walking.  She has prominent resting tremors on both arms.  Romberg is negative.  Tandem gait was not attempted.  Reflexes: Deep tendon reflexes are symmetric.    CT head and cervical 04/13/18:  IMPRESSION: CT HEAD:  1. No acute intracranial process. New RIGHT scalp hematoma, subacute RIGHT frontal scalp hematoma. No skull fracture. 2. Stable examination including moderate to severe parenchymal brain volume loss.  CT CERVICAL SPINE:  1. No acute fracture or malalignment. 2. Stable minimal grade 1 C2-3 and C7-T1 spondylolisthesis.  * CT scan images were reviewed online. I agree with the written report.    Assessment/Plan:  1.  Parkinson's disease  2.  Dementia  3.  Gait disorder  The patient has a bad combination of a significant gait and balance disorder along with a dementia.  The patient requires a walker for ambulation, but she cannot consistently use this.  Use of a wheelchair is unlikely to correct the problem as the patient will try to get up out of the wheelchair and  ambulate.  The patient is at risk for further falls.  The patient will be taken off of Sinemet and switch to Stalevo taking the 25/1 100/200 mg tablets, 1 tablet 3 times daily.  The patient will follow-up for the next scheduled visit in August 2019.  We will need to watch out for any worsening confusion, the use of Seroquel in the future may need to be considered.  The patient is getting physical therapy for her walking.  Jill Alexanders MD 04/20/2018 3:40 PM  [facility] [address] [place], [place] [address]  Phone [phone] Fax [phone]

## 2018-04-20 NOTE — Telephone Encounter (Signed)
(  Health and Wellness Director)Theresa Sloan@ [facility]) has called re: pt's carbidopa-levodopa-entacapone (STALEVO 100) 25-100-200 MG tablet she would like a call back to discuss taking this medication vrs previous carbidopa-levodopa

## 2018-04-21 DIAGNOSIS — G309 Alzheimer's disease, unspecified: Secondary | ICD-10-CM | POA: Diagnosis not present

## 2018-04-21 DIAGNOSIS — Z9181 History of falling: Secondary | ICD-10-CM | POA: Diagnosis not present

## 2018-04-21 DIAGNOSIS — I10 Essential (primary) hypertension: Secondary | ICD-10-CM | POA: Diagnosis not present

## 2018-04-21 DIAGNOSIS — F05 Delirium due to known physiological condition: Secondary | ICD-10-CM | POA: Diagnosis not present

## 2018-04-21 DIAGNOSIS — G20 Parkinson's disease: Secondary | ICD-10-CM | POA: Diagnosis not present

## 2018-04-21 DIAGNOSIS — F0281 Dementia in other diseases classified elsewhere with behavioral disturbance: Secondary | ICD-10-CM | POA: Diagnosis not present

## 2018-04-22 ENCOUNTER — Other Ambulatory Visit: Payer: Medicare Other

## 2018-04-22 DIAGNOSIS — R350 Frequency of micturition: Secondary | ICD-10-CM

## 2018-04-22 DIAGNOSIS — R300 Dysuria: Secondary | ICD-10-CM | POA: Diagnosis not present

## 2018-04-22 LAB — URINALYSIS, ROUTINE W REFLEX MICROSCOPIC
Bilirubin Urine: NEGATIVE
Glucose, UA: NEGATIVE
Hgb urine dipstick: NEGATIVE
Leukocytes, UA: NEGATIVE
NITRITE: NEGATIVE
Protein, ur: NEGATIVE
SPECIFIC GRAVITY, URINE: 1.015 (ref 1.001–1.03)
pH: 6.5 (ref 5.0–8.0)

## 2018-04-25 ENCOUNTER — Other Ambulatory Visit: Payer: Self-pay | Admitting: Family Medicine

## 2018-04-25 ENCOUNTER — Encounter: Payer: Self-pay | Admitting: Family Medicine

## 2018-04-25 ENCOUNTER — Encounter: Payer: Self-pay | Admitting: Neurology

## 2018-04-25 LAB — URINE CULTURE
MICRO NUMBER:: 90633351
SPECIMEN QUALITY:: ADEQUATE

## 2018-04-25 MED ORDER — SULFAMETHOXAZOLE-TRIMETHOPRIM 800-160 MG PO TABS: 1.0000 | ORAL_TABLET | Freq: Two times a day (BID) | ORAL | 0 refills | Status: DC

## 2018-04-29 DIAGNOSIS — F0281 Dementia in other diseases classified elsewhere with behavioral disturbance: Secondary | ICD-10-CM | POA: Diagnosis not present

## 2018-04-29 DIAGNOSIS — F05 Delirium due to known physiological condition: Secondary | ICD-10-CM | POA: Diagnosis not present

## 2018-04-29 DIAGNOSIS — I10 Essential (primary) hypertension: Secondary | ICD-10-CM | POA: Diagnosis not present

## 2018-04-29 DIAGNOSIS — G309 Alzheimer's disease, unspecified: Secondary | ICD-10-CM | POA: Diagnosis not present

## 2018-04-29 DIAGNOSIS — G20 Parkinson's disease: Secondary | ICD-10-CM | POA: Diagnosis not present

## 2018-04-29 DIAGNOSIS — Z9181 History of falling: Secondary | ICD-10-CM | POA: Diagnosis not present

## 2018-04-30 DIAGNOSIS — I10 Essential (primary) hypertension: Secondary | ICD-10-CM | POA: Diagnosis not present

## 2018-04-30 DIAGNOSIS — Z9181 History of falling: Secondary | ICD-10-CM | POA: Diagnosis not present

## 2018-04-30 DIAGNOSIS — G309 Alzheimer's disease, unspecified: Secondary | ICD-10-CM | POA: Diagnosis not present

## 2018-04-30 DIAGNOSIS — F0281 Dementia in other diseases classified elsewhere with behavioral disturbance: Secondary | ICD-10-CM | POA: Diagnosis not present

## 2018-04-30 DIAGNOSIS — G20 Parkinson's disease: Secondary | ICD-10-CM | POA: Diagnosis not present

## 2018-04-30 DIAGNOSIS — F05 Delirium due to known physiological condition: Secondary | ICD-10-CM | POA: Diagnosis not present

## 2018-05-01 ENCOUNTER — Emergency Department: Payer: Medicare Other

## 2018-05-01 ENCOUNTER — Other Ambulatory Visit: Payer: Self-pay

## 2018-05-01 ENCOUNTER — Encounter: Payer: Self-pay | Admitting: Neurology

## 2018-05-01 ENCOUNTER — Encounter: Payer: Self-pay | Admitting: Family Medicine

## 2018-05-01 ENCOUNTER — Encounter: Payer: Self-pay | Admitting: *Deleted

## 2018-05-01 ENCOUNTER — Inpatient Hospital Stay
Admission: EM | Admit: 2018-05-01 | Discharge: 2018-05-05 | DRG: 689 | Disposition: A | Discharge status: Skilled Nursing Facility | Payer: Medicare Other | Source: Skilled Nursing Facility | Attending: Internal Medicine | Admitting: Internal Medicine

## 2018-05-01 DIAGNOSIS — A6000 Herpesviral infection of urogenital system, unspecified: Secondary | ICD-10-CM | POA: Diagnosis present

## 2018-05-01 DIAGNOSIS — S199XXA Unspecified injury of neck, initial encounter: Secondary | ICD-10-CM | POA: Diagnosis not present

## 2018-05-01 DIAGNOSIS — R05 Cough: Secondary | ICD-10-CM

## 2018-05-01 DIAGNOSIS — J189 Pneumonia, unspecified organism: Secondary | ICD-10-CM | POA: Diagnosis present

## 2018-05-01 DIAGNOSIS — S0012XA Contusion of left eyelid and periocular area, initial encounter: Secondary | ICD-10-CM | POA: Diagnosis not present

## 2018-05-01 DIAGNOSIS — Z8249 Family history of ischemic heart disease and other diseases of the circulatory system: Secondary | ICD-10-CM | POA: Diagnosis not present

## 2018-05-01 DIAGNOSIS — W19XXXA Unspecified fall, initial encounter: Secondary | ICD-10-CM | POA: Diagnosis present

## 2018-05-01 DIAGNOSIS — S0993XA Unspecified injury of face, initial encounter: Secondary | ICD-10-CM | POA: Diagnosis not present

## 2018-05-01 DIAGNOSIS — F0280 Dementia in other diseases classified elsewhere without behavioral disturbance: Secondary | ICD-10-CM | POA: Diagnosis present

## 2018-05-01 DIAGNOSIS — Z66 Do not resuscitate: Secondary | ICD-10-CM | POA: Diagnosis present

## 2018-05-01 DIAGNOSIS — S299XXA Unspecified injury of thorax, initial encounter: Secondary | ICD-10-CM | POA: Diagnosis not present

## 2018-05-01 DIAGNOSIS — G309 Alzheimer's disease, unspecified: Secondary | ICD-10-CM | POA: Diagnosis not present

## 2018-05-01 DIAGNOSIS — F419 Anxiety disorder, unspecified: Secondary | ICD-10-CM | POA: Diagnosis present

## 2018-05-01 DIAGNOSIS — Z808 Family history of malignant neoplasm of other organs or systems: Secondary | ICD-10-CM

## 2018-05-01 DIAGNOSIS — R319 Hematuria, unspecified: Secondary | ICD-10-CM

## 2018-05-01 DIAGNOSIS — E440 Moderate protein-calorie malnutrition: Secondary | ICD-10-CM | POA: Diagnosis present

## 2018-05-01 DIAGNOSIS — R21 Rash and other nonspecific skin eruption: Secondary | ICD-10-CM | POA: Diagnosis not present

## 2018-05-01 DIAGNOSIS — F0281 Dementia in other diseases classified elsewhere with behavioral disturbance: Secondary | ICD-10-CM | POA: Diagnosis not present

## 2018-05-01 DIAGNOSIS — G20 Parkinson's disease: Secondary | ICD-10-CM | POA: Diagnosis present

## 2018-05-01 DIAGNOSIS — R262 Difficulty in walking, not elsewhere classified: Secondary | ICD-10-CM | POA: Diagnosis not present

## 2018-05-01 DIAGNOSIS — Z7189 Other specified counseling: Secondary | ICD-10-CM

## 2018-05-01 DIAGNOSIS — R531 Weakness: Secondary | ICD-10-CM | POA: Diagnosis not present

## 2018-05-01 DIAGNOSIS — B009 Herpesviral infection, unspecified: Secondary | ICD-10-CM | POA: Diagnosis not present

## 2018-05-01 DIAGNOSIS — N390 Urinary tract infection, site not specified: Secondary | ICD-10-CM

## 2018-05-01 DIAGNOSIS — S0990XA Unspecified injury of head, initial encounter: Secondary | ICD-10-CM | POA: Diagnosis not present

## 2018-05-01 DIAGNOSIS — R627 Adult failure to thrive: Secondary | ICD-10-CM | POA: Diagnosis present

## 2018-05-01 DIAGNOSIS — I10 Essential (primary) hypertension: Secondary | ICD-10-CM | POA: Diagnosis present

## 2018-05-01 DIAGNOSIS — E785 Hyperlipidemia, unspecified: Secondary | ICD-10-CM | POA: Diagnosis present

## 2018-05-01 DIAGNOSIS — R059 Cough, unspecified: Secondary | ICD-10-CM

## 2018-05-01 DIAGNOSIS — B9620 Unspecified Escherichia coli [E. coli] as the cause of diseases classified elsewhere: Secondary | ICD-10-CM | POA: Diagnosis present

## 2018-05-01 DIAGNOSIS — Z741 Need for assistance with personal care: Secondary | ICD-10-CM | POA: Diagnosis not present

## 2018-05-01 DIAGNOSIS — R4189 Other symptoms and signs involving cognitive functions and awareness: Secondary | ICD-10-CM | POA: Diagnosis not present

## 2018-05-01 DIAGNOSIS — R296 Repeated falls: Secondary | ICD-10-CM | POA: Diagnosis present

## 2018-05-01 DIAGNOSIS — K219 Gastro-esophageal reflux disease without esophagitis: Secondary | ICD-10-CM | POA: Diagnosis present

## 2018-05-01 DIAGNOSIS — Z87440 Personal history of urinary (tract) infections: Secondary | ICD-10-CM | POA: Diagnosis not present

## 2018-05-01 DIAGNOSIS — S0003XA Contusion of scalp, initial encounter: Secondary | ICD-10-CM

## 2018-05-01 DIAGNOSIS — S40022A Contusion of left upper arm, initial encounter: Secondary | ICD-10-CM | POA: Diagnosis not present

## 2018-05-01 DIAGNOSIS — Z515 Encounter for palliative care: Secondary | ICD-10-CM | POA: Diagnosis not present

## 2018-05-01 LAB — URINALYSIS, COMPLETE (UACMP) WITH MICROSCOPIC
Bilirubin Urine: NEGATIVE
GLUCOSE, UA: NEGATIVE mg/dL
KETONES UR: NEGATIVE mg/dL
Nitrite: NEGATIVE
PROTEIN: 30 mg/dL — AB
RBC / HPF: >50 RBC/hpf — ABNORMAL HIGH (ref 0–5)
Specific Gravity, Urine: 1.008 (ref 1.005–1.030)
WBC, UA: >50 WBC/hpf — ABNORMAL HIGH (ref 0–5)
pH: 6 (ref 5.0–8.0)

## 2018-05-01 LAB — CBC WITH DIFFERENTIAL/PLATELET
BASOS ABS: 0.1 10*3/uL (ref 0–0.1)
Basophils Relative: 1 %
EOS ABS: 0 10*3/uL (ref 0–0.7)
EOS PCT: 1 %
HCT: 38.7 % (ref 35.0–47.0)
HEMOGLOBIN: 13.4 g/dL (ref 12.0–16.0)
LYMPHS PCT: 18 %
Lymphs Abs: 1.6 10*3/uL (ref 1.0–3.6)
MCH: 34.7 pg — ABNORMAL HIGH (ref 26.0–34.0)
MCHC: 34.7 g/dL (ref 32.0–36.0)
MCV: 100 fL (ref 80.0–100.0)
Monocytes Absolute: 0.9 10*3/uL (ref 0.2–0.9)
Monocytes Relative: 10 %
NEUTROS PCT: 70 %
Neutro Abs: 6.3 10*3/uL (ref 1.4–6.5)
PLATELETS: 220 10*3/uL (ref 150–440)
RBC: 3.87 MIL/uL (ref 3.80–5.20)
RDW: 13.9 % (ref 11.5–14.5)
WBC: 8.8 10*3/uL (ref 3.6–11.0)

## 2018-05-01 LAB — COMPREHENSIVE METABOLIC PANEL
ALT: 17 U/L (ref 14–54)
AST: 36 U/L (ref 15–41)
Albumin: 3.9 g/dL (ref 3.5–5.0)
Alkaline Phosphatase: 83 U/L (ref 38–126)
Anion gap: 13 (ref 5–15)
BILIRUBIN TOTAL: 0.4 mg/dL (ref 0.3–1.2)
BUN: 17 mg/dL (ref 6–20)
CO2: 21 mmol/L — ABNORMAL LOW (ref 22–32)
Calcium: 9 mg/dL (ref 8.9–10.3)
Chloride: 100 mmol/L — ABNORMAL LOW (ref 101–111)
Creatinine, Ser: 1.09 mg/dL — ABNORMAL HIGH (ref 0.44–1.00)
GFR, EST AFRICAN AMERICAN: 56 mL/min — AB (ref >60)
GFR, EST NON AFRICAN AMERICAN: 48 mL/min — AB (ref >60)
Glucose, Bld: 122 mg/dL — ABNORMAL HIGH (ref 65–99)
POTASSIUM: 3.8 mmol/L (ref 3.5–5.1)
Sodium: 134 mmol/L — ABNORMAL LOW (ref 135–145)
TOTAL PROTEIN: 7.1 g/dL (ref 6.5–8.1)

## 2018-05-01 LAB — MRSA PCR SCREENING: MRSA BY PCR: NEGATIVE

## 2018-05-01 LAB — TROPONIN I

## 2018-05-01 MED ORDER — CARBIDOPA-LEVODOPA-ENTACAPONE 25-100-200 MG PO TABS: 1.0000 | ORAL_TABLET | Freq: Three times a day (TID) | ORAL | Status: DC

## 2018-05-01 MED ORDER — ENTACAPONE 200 MG PO TABS
200.0000 mg | ORAL_TABLET | Freq: Three times a day (TID) | ORAL | Status: DC
  Administered 2018-05-01 – 2018-05-05 (×9): 200 mg via ORAL
  Filled 2018-05-01 (×13): qty 1

## 2018-05-01 MED ORDER — DIPHENHYDRAMINE HCL 50 MG/ML IJ SOLN
25.0000 mg | Freq: Once | INTRAMUSCULAR | Status: AC
  Administered 2018-05-03: 25 mg via INTRAVENOUS
  Filled 2018-05-01 (×2): qty 1

## 2018-05-01 MED ORDER — RIVASTIGMINE 9.5 MG/24HR TD PT24
9.5000 mg | MEDICATED_PATCH | Freq: Every day | TRANSDERMAL | Status: DC
  Administered 2018-05-02 – 2018-05-05 (×4): 9.5 mg via TRANSDERMAL
  Filled 2018-05-01 (×4): qty 1

## 2018-05-01 MED ORDER — PROPRANOLOL HCL ER 60 MG PO CP24
60.0000 mg | ORAL_CAPSULE | Freq: Every day | ORAL | Status: DC
  Administered 2018-05-02 – 2018-05-05 (×3): 60 mg via ORAL
  Filled 2018-05-01 (×4): qty 1

## 2018-05-01 MED ORDER — SODIUM CHLORIDE 0.9 % IV BOLUS
500.0000 mL | Freq: Once | INTRAVENOUS | Status: AC
  Administered 2018-05-01: 500 mL via INTRAVENOUS

## 2018-05-01 MED ORDER — ACETAMINOPHEN 650 MG RE SUPP: 650.0000 mg | Freq: Four times a day (QID) | RECTAL | Status: DC | PRN

## 2018-05-01 MED ORDER — ENOXAPARIN SODIUM 40 MG/0.4ML ~~LOC~~ SOLN
40.0000 mg | SUBCUTANEOUS | Status: DC
  Administered 2018-05-01 – 2018-05-04 (×4): 40 mg via SUBCUTANEOUS
  Filled 2018-05-01 (×4): qty 0.4

## 2018-05-01 MED ORDER — ATORVASTATIN CALCIUM 20 MG PO TABS
80.0000 mg | ORAL_TABLET | Freq: Every evening | ORAL | Status: DC
  Administered 2018-05-01 – 2018-05-04 (×3): 80 mg via ORAL
  Filled 2018-05-01 (×3): qty 4

## 2018-05-01 MED ORDER — CARBIDOPA-LEVODOPA 25-100 MG PO TABS
1.0000 | ORAL_TABLET | Freq: Three times a day (TID) | ORAL | Status: DC
  Administered 2018-05-01 – 2018-05-05 (×9): 1 via ORAL
  Filled 2018-05-01 (×13): qty 1

## 2018-05-01 MED ORDER — SODIUM CHLORIDE 0.9 % IV SOLN
1.0000 g | Freq: Once | INTRAVENOUS | Status: AC
  Administered 2018-05-01: 1 g via INTRAVENOUS
  Filled 2018-05-01: qty 10

## 2018-05-01 MED ORDER — VALACYCLOVIR HCL 500 MG PO TABS: 1000.0000 mg | ORAL_TABLET | Freq: Three times a day (TID) | ORAL | Status: DC

## 2018-05-01 MED ORDER — VALACYCLOVIR HCL 500 MG PO TABS
1000.0000 mg | ORAL_TABLET | Freq: Two times a day (BID) | ORAL | Status: DC
  Administered 2018-05-02: 1000 mg via ORAL
  Filled 2018-05-01 (×4): qty 2

## 2018-05-01 MED ORDER — ACETAMINOPHEN 325 MG PO TABS
650.0000 mg | ORAL_TABLET | Freq: Four times a day (QID) | ORAL | Status: DC | PRN
  Administered 2018-05-01: 650 mg via ORAL
  Filled 2018-05-01: qty 2

## 2018-05-01 MED ORDER — MEMANTINE HCL 5 MG PO TABS
10.0000 mg | ORAL_TABLET | Freq: Two times a day (BID) | ORAL | Status: DC
  Administered 2018-05-01 – 2018-05-05 (×7): 10 mg via ORAL
  Filled 2018-05-01 (×7): qty 2

## 2018-05-01 MED ORDER — PANTOPRAZOLE SODIUM 40 MG PO TBEC
40.0000 mg | DELAYED_RELEASE_TABLET | Freq: Every day | ORAL | Status: DC
  Administered 2018-05-02 – 2018-05-05 (×3): 40 mg via ORAL
  Filled 2018-05-01 (×3): qty 1

## 2018-05-01 MED ORDER — ONDANSETRON HCL 4 MG PO TABS: 4.0000 mg | ORAL_TABLET | Freq: Four times a day (QID) | ORAL | Status: DC | PRN

## 2018-05-01 MED ORDER — LAMOTRIGINE 25 MG PO TABS
50.0000 mg | ORAL_TABLET | Freq: Two times a day (BID) | ORAL | Status: DC
  Administered 2018-05-01 – 2018-05-05 (×7): 50 mg via ORAL
  Filled 2018-05-01 (×8): qty 2

## 2018-05-01 MED ORDER — ESCITALOPRAM OXALATE 10 MG PO TABS
20.0000 mg | ORAL_TABLET | Freq: Every day | ORAL | Status: DC
  Administered 2018-05-02 – 2018-05-05 (×3): 20 mg via ORAL
  Filled 2018-05-01 (×4): qty 2

## 2018-05-01 MED ORDER — SODIUM CHLORIDE 0.9 % IV SOLN
1.0000 g | INTRAVENOUS | Status: DC
  Administered 2018-05-02 – 2018-05-04 (×3): 1 g via INTRAVENOUS
  Filled 2018-05-01 (×4): qty 10

## 2018-05-01 MED ORDER — ONDANSETRON HCL 4 MG/2ML IJ SOLN: 4.0000 mg | Freq: Four times a day (QID) | INTRAMUSCULAR | Status: DC | PRN

## 2018-05-01 NOTE — Progress Notes (Signed)
   [facility] Day: 0 days Theresa Sloan is a 77 y.o. female with past medical history of advanced Alzheimer's dementia, Parkinson's disease, failure to thrive, presenting with Fall Given her advanced dementia and with recurrent falls her prognosis is poor to guarded.  This was discussed extensively with the patient's daughter at bedside.  Advance care planning discussed with patient  with daughter at bedside. All questions in regards to overall condition and expected prognosis answered. The decision was made to continue current code status  CODE STATUS: full Time spent: 16 minutes

## 2018-05-01 NOTE — Progress Notes (Signed)
Called by nurse as pt was noted to have a rash on left buttocks which was consistent with shingles.  As per the patient's daughter patient usually gets her shingles attack in the same area.  Exam:  Left buttocks-raised vesicular rash noted.  Plan: -We will start the patient on oral Valtrex. -Placed on airborne precautions

## 2018-05-01 NOTE — ED Triage Notes (Signed)
Per EMS report, patient resides at [facility] and had an unwitnessed fall and was found at approximately 0630 today. Staff reported to EMS that they decided to send the patient to be evaluated when they noticed the swelling around the left eye.

## 2018-05-01 NOTE — ED Notes (Signed)
Patient just voided in brief. Will defer I&O cath until fluids have infused. MD aware.

## 2018-05-01 NOTE — ED Provider Notes (Signed)
[facility] Emergency Department Provider Note ____________________________________________   First MD Initiated Contact with Patient 05/01/18 1346     (approximate)  I have reviewed the triage vital signs and the nursing notes.   HISTORY  Chief Complaint Fall  Level 5 caveat: History of present illness limited due to dementia  HPI Theresa Sloan is a 77 y.o. female with PMH as noted below who presents after an unwitnessed fall.  The patient was found on the floor at approximately 6:30 AM, but was brought into the ED several hours later after swelling developed.  Per the patient's daughter, the patient has had multiple falls over the last month most of which have been unwitnessed.  She states that the patient is also being treated for UTI and has been on antibiotics since the middle of last month.  Past Medical History:  Diagnosis Date  . Alzheimer disease   . Anxiety   . Dementia   . 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 Gait difficulty 03/05/2015  . GERD (gastroesophageal reflux disease)   . Hyperlipidemia   . Hypertension   . Memory difficulty 08/07/2014  . TMJ (dislocation of temporomandibular joint)     Patient Active Problem List   Diagnosis Date Noted  . Essential tremor 02/03/2016  . Subacute confusional state 02/03/2016  . Gait difficulty 03/05/2015  . Dementia   . Memory difficulty 08/07/2014  . 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 GERD (gastroesophageal reflux disease) 12/21/2013  . Insomnia 12/21/2013  . 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 Dry eyes 07/07/2012  . Epiretinal membrane (ERM) of left eye 07/07/2012  . 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 Pseudophakia of both eyes 07/07/2012    Past Surgical History:  Procedure Laterality Date  . CESAREAN SECTION  1992  . RETINAL TEAR REPAIR CRYOTHERAPY      Prior to Admission  medications   Medication Sig Start Date End Date Taking? Authorizing Provider  acetaminophen (TYLENOL 8 HOUR) 650 MG CR tablet Take 650 mg by mouth every 6 (six) hours as needed for pain.   Yes [provider]  acetaminophen (TYLENOL) 500 MG tablet Take 500 mg by mouth daily.   Yes [provider]  atorvastatin (LIPITOR) 80 MG tablet Take 80 mg by mouth every evening.    Yes [provider]  carbidopa-levodopa-entacapone (STALEVO 100) 25-100-200 MG tablet Take 1 tablet by mouth 3 (three) times daily. 04/20/18  Yes Kathrynn Ducking, MD  escitalopram (LEXAPRO) 20 MG tablet TAKE ONE (1) TABLET BY MOUTH EVERY DAY 05/21/17  Yes Kathrynn Ducking, MD  lamoTRIgine (LAMICTAL) 25 MG tablet Take 2 tablets (50 mg total) by mouth 2 (two) times daily. 04/20/18  Yes Kathrynn Ducking, MD  memantine (NAMENDA) 10 MG tablet Take 1 tablet (10 mg total) by mouth 2 (two) times daily. 09/02/17  Yes Ward Givens, NP  Multiple Vitamins-Minerals (PRESERVISION AREDS 2 PO) Take 1 capsule by mouth 2 (two) times daily.    Yes [provider]  omeprazole (PRILOSEC) 20 MG capsule Take 1 capsule (20 mg total) by mouth daily. 10/13/16  Yes [name], MD  propranolol ER (INDERAL LA) 60 MG 24 hr capsule TAKE ONE (1) CAPSULE EACH DAY 08/09/17  Yes Kathrynn Ducking, MD  rivastigmine (EXELON) 9.5 mg/24hr APPLY 1 PATCH TO THE SKIN DAILY Patient taking differently: Place 9.5 mg onto the skin daily. Place in the  center of back so patient cannot remove. 09/02/17  Yes Ward Givens, NP  sulfamethoxazole-trimethoprim (BACTRIM DS,SEPTRA DS) 800-160 MG tablet Take 1 tablet by mouth 2 (two) times daily. 04/25/18  Yes [name], MD  UNABLE TO FIND Take 4 oz by mouth every evening. Med Name: Elmer Picker   Yes [provider]    Allergies Patient has no known allergies.  Family History  Problem Relation Age of Onset  . Hypertension Sister   . Tremor Sister     Social  History Social History   Tobacco Use  . Smoking status: Never Smoker  . Smokeless tobacco: Never Used  Substance Use Topics  . Alcohol use: No  . Drug use: No    Review of Systems Level 5 caveat: Unable to obtain review of systems due to dementia    ____________________________________________   PHYSICAL EXAM:  VITAL SIGNS: ED Triage Vitals  Enc Vitals Group     BP 05/01/18 1339 (!) 134/109     Pulse Rate 05/01/18 1339 (!) 56     Resp 05/01/18 1339 18     Temp 05/01/18 1339 97.9 F (36.6 C)     Temp Source 05/01/18 1339 Axillary     SpO2 05/01/18 1339 98 %     Weight 05/01/18 1344 112 lb 4.8 oz (50.9 kg)     Height 05/01/18 1344 5\' 2"  (1.575 m)     Head Circumference --      Peak Flow --      Pain Score --      Pain Loc --      Pain Edu? --      Excl. in [place]? --     Constitutional: Alert, following commands.  At baseline mental status per family. Eyes: Conjunctivae are normal.  EOMI.  PERRLA.  Left periorbital ecchymosis. Head: 2 cm superficial abrasion to forehead. Nose: No congestion/rhinnorhea.  Deformity of nasal bone.  No septal hematoma. Mouth/Throat: Mucous membranes are slightly dry.   Neck: Normal range of motion.  No midline cervical spinal tenderness. Cardiovascular: Normal rate, regular rhythm. Grossly normal heart sounds.  Good peripheral circulation. Respiratory: Normal respiratory effort.  No retractions. Lungs CTAB. Gastrointestinal: Soft and nontender. No distention.  Genitourinary: No flank tenderness. Musculoskeletal: No lower extremity edema.  Extremities warm and well perfused.  Neurologic: Motor intact in all extremities. Skin:  Skin is warm and dry. No rash noted. Psychiatric: Calm and cooperative.  ____________________________________________   LABS (all labs ordered are listed, but only abnormal results are displayed)  Labs Reviewed  URINE CULTURE  TROPONIN I  COMPREHENSIVE METABOLIC PANEL  CBC WITH DIFFERENTIAL/PLATELET    URINALYSIS, COMPLETE (UACMP) WITH MICROSCOPIC   ____________________________________________  EKG  Pending ____________________________________________  RADIOLOGY  CT head: No ICH or other acute findings CT maxillofacial: No acute fracture CT cervical spine: No acute fracture XR left humerus: No acute fracture CXR: Pending  ____________________________________________   PROCEDURES  Procedure(s) performed: No  Procedures  Critical Care performed: No ____________________________________________   INITIAL IMPRESSION / ASSESSMENT AND PLAN / ED COURSE  Pertinent labs & imaging results that were available during my care of the patient were reviewed by me and considered in my medical decision making (see chart for details).  77 year old female with PMH as noted above presents after an unwitnessed fall earlier this morning, with bruising around the left eye, apparent nasal bone deformity, and forehead abrasion.  She also has some bruising to the left upper arm.  I reviewed the past  medical records in epic; the patient was initially seen on 04/08/2018 after a fall and had a nasal bone fracture.  She was diagnosed with a UTI and started on antibiotics.  She was seen in the ED for another fall with a negative imaging on 5/15.  On exam, the patient is alert.  Vital signs are normal except for slight hypertension.  She is moving all extremities and following commands.  The remainder of the exam is as described above.  In terms of the fall and trauma, it is possible that the patient has a new acute nasal bone fracture.  I will obtain a CT of the head, maxillofacial, and C-spine to rule out acute injuries.  We will also obtain an x-ray of the left humerus.  Overall, given the frequent falls, the patient's daughter is concerned for dehydration or potentially symptoms related to persistent UTI, and I agree with these concerns.  Differential also includes other metabolic etiologies such as  electrolyte abnormality, or less likely cardiac cause.  Will obtain EKG and lab work-up for these etiologies, give fluids, and reassess.  ----------------------------------------- 3:06 PM on 05/01/2018 -----------------------------------------  The patient's imaging is negative for acute fractures or ICH.  C-spine CT showed possible opacity in the right lung apex, so I will obtain a chest x-ray as well.  The patient's labs and UA are still pending.  I signed the patient out to the oncoming physician Dr. Jimmye Norman.    ____________________________________________   FINAL CLINICAL IMPRESSION(S) / ED DIAGNOSES  Final diagnoses:  Fall, initial encounter      NEW MEDICATIONS STARTED DURING THIS VISIT:  New Prescriptions   No medications on file     Note:  This document was prepared using Dragon voice recognition software and may include unintentional dictation errors.    Arta Silence, MD 05/01/18 [phone]

## 2018-05-01 NOTE — ED Notes (Signed)
Patient taken to imaging. 

## 2018-05-01 NOTE — H&P (Signed)
[facility] NAME: Theresa Sloan    MR#:  [id number]  DATE OF BIRTH:  [birth year]  DATE OF ADMISSION:  05/01/2018  PRIMARY CARE PHYSICIAN: [name], MD   REQUESTING/REFERRING PHYSICIAN: Dr. Lenise Arena.   CHIEF COMPLAINT:   Chief Complaint  Patient presents with  . Fall    HISTORY OF PRESENT ILLNESS:  Richa Sloan  is a 77 y.o. female with a known history of advanced Alzheimer's dementia, Parkinson's disease, hypertension, hyperlipidemia, anxiety who presents to the hospital due to recurrent falls and generalized weakness.  Patient herself is a very poor historian therefore most of the history obtained from the daughter at bedside.  As per the daughter patient has had 5 falls in the past month.  Today she was found down her assisted living this morning.  She was brought to the ER for further evaluation.  On routine blood work patient was noted to have a urinary tract infection.  Patient was suspected to have a UTI at her assisted living and started on oral Bactrim just a few days ago and has 2 more days of therapy left.  Despite being on antibiotics she feels increasingly weak, she is not eating well and has had recurrent falls and therefore brought to the ER for further evaluation.  PAST MEDICAL HISTORY:   Past Medical History:  Diagnosis Date  . Alzheimer disease   . Anxiety   . Dementia   . 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 Gait difficulty 03/05/2015  . GERD (gastroesophageal reflux disease)   . Hyperlipidemia   . Hypertension   . Memory difficulty 08/07/2014  . TMJ (dislocation of temporomandibular joint)     PAST SURGICAL HISTORY:   Past Surgical History:  Procedure Laterality Date  . CESAREAN SECTION  1992  . RETINAL TEAR REPAIR CRYOTHERAPY      SOCIAL HISTORY:   Social History   Tobacco Use  . Smoking status: Never Smoker  . Smokeless tobacco: Never Used  Substance Use  Topics  . Alcohol use: No    FAMILY HISTORY:   Family History  Problem Relation Age of Onset  . Hypertension Sister   . Tremor Sister   . Brain cancer Mother     DRUG ALLERGIES:  No Known Allergies  REVIEW OF SYSTEMS:   Review of Systems  Unable to perform ROS: Dementia    MEDICATIONS AT HOME:   Prior to Admission medications   Medication Sig Start Date End Date Taking? Authorizing Provider  acetaminophen (TYLENOL 8 HOUR) 650 MG CR tablet Take 650 mg by mouth every 6 (six) hours as needed for pain.   Yes [provider]  acetaminophen (TYLENOL) 500 MG tablet Take 500 mg by mouth daily.   Yes [provider]  atorvastatin (LIPITOR) 80 MG tablet Take 80 mg by mouth every evening.    Yes [provider]  carbidopa-levodopa-entacapone (STALEVO 100) 25-100-200 MG tablet Take 1 tablet by mouth 3 (three) times daily. 04/20/18  Yes Kathrynn Ducking, MD  escitalopram (LEXAPRO) 20 MG tablet TAKE ONE (1) TABLET BY MOUTH EVERY DAY 05/21/17  Yes Kathrynn Ducking, MD  lamoTRIgine (LAMICTAL) 25 MG tablet Take 2 tablets (50 mg total) by mouth 2 (two) times daily. 04/20/18  Yes Kathrynn Ducking, MD  memantine (NAMENDA) 10 MG tablet Take 1 tablet (10 mg total) by mouth 2 (two) times daily. 09/02/17  Yes Ward Givens, NP  Multiple  Vitamins-Minerals (PRESERVISION AREDS 2 PO) Take 1 capsule by mouth 2 (two) times daily.    Yes [provider]  omeprazole (PRILOSEC) 20 MG capsule Take 1 capsule (20 mg total) by mouth daily. 10/13/16  Yes [name], MD  propranolol ER (INDERAL LA) 60 MG 24 hr capsule TAKE ONE (1) CAPSULE EACH DAY 08/09/17  Yes Kathrynn Ducking, MD  rivastigmine (EXELON) 9.5 mg/24hr APPLY 1 PATCH TO THE SKIN DAILY Patient taking differently: Place 9.5 mg onto the skin daily. Place in the center of back so patient cannot remove. 09/02/17  Yes Ward Givens, NP  sulfamethoxazole-trimethoprim (BACTRIM DS,SEPTRA DS) 800-160 MG tablet Take 1  tablet by mouth 2 (two) times daily. 04/25/18  Yes [name], MD  UNABLE TO FIND Take 4 oz by mouth every evening. Med Name: [facility]   Yes [provider]      VITAL SIGNS:  Blood pressure 96/60, pulse (!) 53, temperature 97.9 F (36.6 C), temperature source Axillary, resp. rate 18, height 5\' 2"  (1.575 m), weight 50.9 kg (112 lb 4.8 oz), SpO2 98 %.  PHYSICAL EXAMINATION:  Physical Exam  GENERAL:  77 y.o.-year-old patient lying in the bed in no acute distress.  EYES: Pupils equal, round, reactive to light and accommodation. No scleral icterus. Extraocular muscles intact.  Left periorbital bruising noted from the fall. HEENT: Head atraumatic, normocephalic. Oropharynx and nasopharynx clear. No oropharyngeal erythema, moist oral mucosa  NECK:  Supple, no jugular venous distention. No thyroid enlargement, no tenderness.  LUNGS: Normal breath sounds bilaterally, no wheezing, rales, rhonchi. No use of accessory muscles of respiration.  CARDIOVASCULAR: S1, S2 RRR. No murmurs, rubs, gallops, clicks.  ABDOMEN: Soft, nontender, nondistended. Bowel sounds present. No organomegaly or mass.  EXTREMITIES: No pedal edema, cyanosis, or clubbing. + 2 pedal & radial pulses b/l.  Bruising on the left shoulder from recent fall today. NEUROLOGIC: Cranial nerves II through XII are intact. No focal Motor or sensory deficits appreciated b/l, globally weak PSYCHIATRIC: The patient is alert and oriented x 1.  SKIN: No obvious rash, lesion, or ulcer.   LABORATORY PANEL:   CBC Recent Labs  Lab 05/01/18 1505  WBC 8.8  HGB 13.4  HCT 38.7  PLT 220   ------------------------------------------------------------------------------------------------------------------  Chemistries  Recent Labs  Lab 05/01/18 1505  NA 134*  K 3.8  CL 100*  CO2 21*  GLUCOSE 122*  BUN 17  CREATININE 1.09*  CALCIUM 9.0  AST 36  ALT 17  ALKPHOS 83  BILITOT 0.4    ------------------------------------------------------------------------------------------------------------------  Cardiac Enzymes Recent Labs  Lab 05/01/18 1505  TROPONINI <0.03   ------------------------------------------------------------------------------------------------------------------  RADIOLOGY:  Ct Head Wo Contrast  Result Date: 05/01/2018 CLINICAL DATA:  Unwitnessed fall. Left eye bruising. History of dementia. Initial encounter. EXAM: CT HEAD WITHOUT CONTRAST CT MAXILLOFACIAL WITHOUT CONTRAST CT CERVICAL SPINE WITHOUT CONTRAST TECHNIQUE: Multidetector CT imaging of the head, cervical spine, and maxillofacial structures were performed using the standard protocol without intravenous contrast. Multiplanar CT image reconstructions of the cervical spine and maxillofacial structures were also generated. COMPARISON:  Head and cervical spine CT 04/13/2018. Maxillofacial CT 04/08/2018. FINDINGS: CT HEAD FINDINGS Brain: There is no evidence of acute infarct, intracranial hemorrhage, mass, midline shift, or extra-axial fluid collection. Moderately advanced cerebral atrophy is again noted with asymmetric enlargement of the right lateral ventricle, unchanged. Periventricular white matter hypodensities are unchanged and nonspecific but compatible with chronic small vessel ischemic disease, minimal for age. Vascular: Calcified atherosclerosis at the skull base. No  hyperdense vessel. Skull: No acute fracture or focal osseous lesion. Other: Resolved right parietal scalp hematoma. Small left supraorbital/frontal scalp hematoma, new. Essentially resolved right frontal scalp hematoma. CT MAXILLOFACIAL FINDINGS Osseous: Old nasal bone fracture. No acute fracture or mandibular dislocation. Moderate bilateral TMJ arthrosis. Orbits: Bilateral cataract extraction. Left scleral buckle. Sinuses: Small right maxillary sinus mucous retention cyst. Clear mastoid air cells. Soft tissues: Small left supraorbital  hematoma. CT CERVICAL SPINE FINDINGS Alignment: Unchanged slight retrolisthesis of C2 on C3 and C4 on C5 and slight anterolisthesis of C7 on T1. Skull base and vertebrae: No acute fracture or destructive osseous lesion. Soft tissues and spinal canal: No prevertebral fluid or swelling. No visible canal hematoma. Disc levels: Moderate to severe disc space narrowing at C2-3, C4-5, C5-6, and C6-7. Moderate left greater than right multilevel facet arthrosis. Moderate to severe neural foraminal stenosis from C3-4 to C6-7. No significant osseous spinal canal stenosis. Upper chest: Mild motion artifact through the lung apices with partially visualized mild streaky opacity in the posterior right upper lobe and superior segment of the right lower lobe. Other: Carotid artery atherosclerosis. IMPRESSION: 1. No evidence of acute intracranial abnormality. 2. Small left supraorbital hematoma. 3. No acute maxillofacial fracture. 4. No acute fracture or traumatic subluxation identified in the cervical spine. 5. Partially visualized mild opacity in the posterior right upper lung. Correlate for any acute pulmonary symptoms and consider further evaluation with chest radiographs. Electronically Signed   By: Logan Bores M.D.   On: 05/01/2018 14:44   Ct Cervical Spine Wo Contrast  Result Date: 05/01/2018 CLINICAL DATA:  Unwitnessed fall. Left eye bruising. History of dementia. Initial encounter. EXAM: CT HEAD WITHOUT CONTRAST CT MAXILLOFACIAL WITHOUT CONTRAST CT CERVICAL SPINE WITHOUT CONTRAST TECHNIQUE: Multidetector CT imaging of the head, cervical spine, and maxillofacial structures were performed using the standard protocol without intravenous contrast. Multiplanar CT image reconstructions of the cervical spine and maxillofacial structures were also generated. COMPARISON:  Head and cervical spine CT 04/13/2018. Maxillofacial CT 04/08/2018. FINDINGS: CT HEAD FINDINGS Brain: There is no evidence of acute infarct, intracranial  hemorrhage, mass, midline shift, or extra-axial fluid collection. Moderately advanced cerebral atrophy is again noted with asymmetric enlargement of the right lateral ventricle, unchanged. Periventricular white matter hypodensities are unchanged and nonspecific but compatible with chronic small vessel ischemic disease, minimal for age. Vascular: Calcified atherosclerosis at the skull base. No hyperdense vessel. Skull: No acute fracture or focal osseous lesion. Other: Resolved right parietal scalp hematoma. Small left supraorbital/frontal scalp hematoma, new. Essentially resolved right frontal scalp hematoma. CT MAXILLOFACIAL FINDINGS Osseous: Old nasal bone fracture. No acute fracture or mandibular dislocation. Moderate bilateral TMJ arthrosis. Orbits: Bilateral cataract extraction. Left scleral buckle. Sinuses: Small right maxillary sinus mucous retention cyst. Clear mastoid air cells. Soft tissues: Small left supraorbital hematoma. CT CERVICAL SPINE FINDINGS Alignment: Unchanged slight retrolisthesis of C2 on C3 and C4 on C5 and slight anterolisthesis of C7 on T1. Skull base and vertebrae: No acute fracture or destructive osseous lesion. Soft tissues and spinal canal: No prevertebral fluid or swelling. No visible canal hematoma. Disc levels: Moderate to severe disc space narrowing at C2-3, C4-5, C5-6, and C6-7. Moderate left greater than right multilevel facet arthrosis. Moderate to severe neural foraminal stenosis from C3-4 to C6-7. No significant osseous spinal canal stenosis. Upper chest: Mild motion artifact through the lung apices with partially visualized mild streaky opacity in the posterior right upper lobe and superior segment of the right lower lobe. Other: Carotid artery atherosclerosis. IMPRESSION: 1.  No evidence of acute intracranial abnormality. 2. Small left supraorbital hematoma. 3. No acute maxillofacial fracture. 4. No acute fracture or traumatic subluxation identified in the cervical spine. 5.  Partially visualized mild opacity in the posterior right upper lung. Correlate for any acute pulmonary symptoms and consider further evaluation with chest radiographs. Electronically Signed   By: Logan Bores M.D.   On: 05/01/2018 14:44   Dg Chest Portable 1 View  Result Date: 05/01/2018 CLINICAL DATA:  Fall. EXAM: PORTABLE CHEST 1 VIEW COMPARISON:  Multiple chest x-rays since January 04, 2017 FINDINGS: Focal infiltrate in the lateral right upper lung, not clearly seen previously. No other evidence of nodule, mass, or infiltrate. No pneumothorax. The cardiomediastinal silhouette is unremarkable. IMPRESSION: 1. Mild focal infiltrate in the lateral right upper lung. Recommend short-term follow-up to ensure resolution. Electronically Signed   By: Dorise Bullion III M.D   On: 05/01/2018 15:32   Dg Humerus Left  Result Date: 05/01/2018 CLINICAL DATA:  Bruise after fall. EXAM: LEFT HUMERUS - 2+ VIEW COMPARISON:  None. FINDINGS: There is no evidence of fracture or other focal bone lesions. Soft tissues are unremarkable. IMPRESSION: Negative. Electronically Signed   By: Dorise Bullion III M.D   On: 05/01/2018 14:56   Ct Maxillofacial Wo Contrast  Result Date: 05/01/2018 CLINICAL DATA:  Unwitnessed fall. Left eye bruising. History of dementia. Initial encounter. EXAM: CT HEAD WITHOUT CONTRAST CT MAXILLOFACIAL WITHOUT CONTRAST CT CERVICAL SPINE WITHOUT CONTRAST TECHNIQUE: Multidetector CT imaging of the head, cervical spine, and maxillofacial structures were performed using the standard protocol without intravenous contrast. Multiplanar CT image reconstructions of the cervical spine and maxillofacial structures were also generated. COMPARISON:  Head and cervical spine CT 04/13/2018. Maxillofacial CT 04/08/2018. FINDINGS: CT HEAD FINDINGS Brain: There is no evidence of acute infarct, intracranial hemorrhage, mass, midline shift, or extra-axial fluid collection. Moderately advanced cerebral atrophy is again noted  with asymmetric enlargement of the right lateral ventricle, unchanged. Periventricular white matter hypodensities are unchanged and nonspecific but compatible with chronic small vessel ischemic disease, minimal for age. Vascular: Calcified atherosclerosis at the skull base. No hyperdense vessel. Skull: No acute fracture or focal osseous lesion. Other: Resolved right parietal scalp hematoma. Small left supraorbital/frontal scalp hematoma, new. Essentially resolved right frontal scalp hematoma. CT MAXILLOFACIAL FINDINGS Osseous: Old nasal bone fracture. No acute fracture or mandibular dislocation. Moderate bilateral TMJ arthrosis. Orbits: Bilateral cataract extraction. Left scleral buckle. Sinuses: Small right maxillary sinus mucous retention cyst. Clear mastoid air cells. Soft tissues: Small left supraorbital hematoma. CT CERVICAL SPINE FINDINGS Alignment: Unchanged slight retrolisthesis of C2 on C3 and C4 on C5 and slight anterolisthesis of C7 on T1. Skull base and vertebrae: No acute fracture or destructive osseous lesion. Soft tissues and spinal canal: No prevertebral fluid or swelling. No visible canal hematoma. Disc levels: Moderate to severe disc space narrowing at C2-3, C4-5, C5-6, and C6-7. Moderate left greater than right multilevel facet arthrosis. Moderate to severe neural foraminal stenosis from C3-4 to C6-7. No significant osseous spinal canal stenosis. Upper chest: Mild motion artifact through the lung apices with partially visualized mild streaky opacity in the posterior right upper lobe and superior segment of the right lower lobe. Other: Carotid artery atherosclerosis. IMPRESSION: 1. No evidence of acute intracranial abnormality. 2. Small left supraorbital hematoma. 3. No acute maxillofacial fracture. 4. No acute fracture or traumatic subluxation identified in the cervical spine. 5. Partially visualized mild opacity in the posterior right upper lung. Correlate for any acute pulmonary symptoms and  consider further evaluation with chest radiographs. Electronically Signed   By: Logan Bores M.D.   On: 05/01/2018 14:44     IMPRESSION AND PLAN:   77 year old female with past medical history of Alzheimer's dementia, Parkinson's disease, hypertension, previous history of urinary tract infection, GERD, anxiety who presents to the hospital due to recurrent falls, generalized weakness and poor p.o. intake and noted to have a urinary tract infection.  1.  Urinary tract infection- this is based off a urinalysis on admission. -Patient was apparently being treated with oral Bactrim as an outpatient but not responding.  We will treat the patient with IV ceftriaxone, follow urine cultures.  Patient is currently afebrile and hemodynamically stable with a normal white cell count.  2.  Recurrent falls/generalized weakness-multifactorial in nature.  Related to underlying dementia with Parkinson's disease combined with 5 to thrive and also underlying UTI. -We will treat the patient's urinary tract infection, will follow her clinically.  We will get a physical therapy consult to assess her mobility.  3.  History of Parkinson's disease-continue Sinemet.  4.  Dementia-patient is high risk for sundowning. -Continue Lamictal, Namenda, Exelon patch. -We will order some as needed Haldol for agitation.  5.  Hyperlipidemia-continue atorvastatin.  6.  GERD-continue Protonix.    All the records are reviewed and case discussed with ED provider. Management plans discussed with the patient, family and they are in agreement.  CODE STATUS: Full code  TOTAL TIME TAKING CARE OF THIS PATIENT: 45 minutes.    Henreitta Leber M.D on 05/01/2018 at 5:55 PM  Between 7am to 6pm - Pager - [phone]  After 6pm go to [email or web address] - password [email or web address] [facility]  Register Hospitalists  Office  [phone]  CC: Primary care physician; [name], MD

## 2018-05-01 NOTE — ED Provider Notes (Signed)
Patient seems lethargic and family states she is very weak with multiple recent falls.  She does have a UTI and is failed outpatient treatment for same.  I will order IV Rocephin and discussed with the hospitalist for admission.   Earleen Newport, MD 05/01/18 [phone]

## 2018-05-02 ENCOUNTER — Telehealth: Payer: Self-pay | Admitting: Neurology

## 2018-05-02 DIAGNOSIS — E440 Moderate protein-calorie malnutrition: Secondary | ICD-10-CM

## 2018-05-02 LAB — BASIC METABOLIC PANEL
Anion gap: 8 (ref 5–15)
BUN: 13 mg/dL (ref 6–20)
CHLORIDE: 105 mmol/L (ref 101–111)
CO2: 24 mmol/L (ref 22–32)
CREATININE: 0.9 mg/dL (ref 0.44–1.00)
Calcium: 8.7 mg/dL — ABNORMAL LOW (ref 8.9–10.3)
GFR calc Af Amer: >60 mL/min (ref >60)
GFR calc non Af Amer: >60 mL/min (ref >60)
GLUCOSE: 91 mg/dL (ref 65–99)
POTASSIUM: 4 mmol/L (ref 3.5–5.1)
Sodium: 137 mmol/L (ref 135–145)

## 2018-05-02 LAB — CBC
HEMATOCRIT: 37.1 % (ref 35.0–47.0)
Hemoglobin: 12.6 g/dL (ref 12.0–16.0)
MCH: 33.7 pg (ref 26.0–34.0)
MCHC: 33.8 g/dL (ref 32.0–36.0)
MCV: 99.6 fL (ref 80.0–100.0)
PLATELETS: 209 10*3/uL (ref 150–440)
RBC: 3.73 MIL/uL — ABNORMAL LOW (ref 3.80–5.20)
RDW: 14 % (ref 11.5–14.5)
WBC: 8.6 10*3/uL (ref 3.6–11.0)

## 2018-05-02 MED ORDER — AZITHROMYCIN 500 MG IV SOLR
500.0000 mg | Freq: Every day | INTRAVENOUS | Status: AC
  Administered 2018-05-02 – 2018-05-04 (×3): 500 mg via INTRAVENOUS
  Filled 2018-05-02 (×3): qty 500

## 2018-05-02 MED ORDER — SODIUM CHLORIDE 0.9 % IV SOLN
INTRAVENOUS | Status: AC
  Administered 2018-05-02 – 2018-05-03 (×2): via INTRAVENOUS

## 2018-05-02 MED ORDER — VALACYCLOVIR HCL 500 MG PO TABS
500.0000 mg | ORAL_TABLET | Freq: Two times a day (BID) | ORAL | Status: AC
  Administered 2018-05-02 – 2018-05-04 (×3): 500 mg via ORAL
  Filled 2018-05-02 (×4): qty 1

## 2018-05-02 MED ORDER — ADULT MULTIVITAMIN W/MINERALS CH
1.0000 | ORAL_TABLET | Freq: Every day | ORAL | Status: DC
  Administered 2018-05-02 – 2018-05-05 (×3): 1 via ORAL
  Filled 2018-05-02 (×3): qty 1

## 2018-05-02 NOTE — Clinical Social Work Note (Signed)
Clinical Social Work Assessment  Patient Details  Name: Theresa Sloan MRN: [id number] Date of Birth: 08-30-1941  Date of referral:  05/02/18               Reason for consult:  Discharge Planning                Permission sought to share information with:  (patient with progressive dementia) Permission granted to share information::     Name::        Agency::     Relationship::     Contact Information:     Housing/Transportation Living arrangements for the past 2 months:  [place] of Information:  Adult Children Patient Interpreter Needed:  None Criminal Activity/Legal Involvement Pertinent to Current Situation/Hospitalization:  No - Comment as needed Significant Relationships:  Adult Children Lives with:  Facility Resident Do you feel safe going back to the place where you live?  Yes Need for family participation in patient care:  Yes (Comment)  Care giving concerns:  Patient resides at [facility].   Social Worker assessment / plan:  CSW spoke with patient's daughter in patient's room this afternoon. Infectious Disease physician is taking patient off precautions and believes patient has herpes and not shingles but will be doing a swab and testing for both.   CSW spoke with patient's daughter, Anastasia Pall ([phone]), this afternoon and she is in agreement with short term rehab for her mother. She prefers [facility] CSW explained the rehab search process and is also aware that if patient has shingles, patient's areas will need to scab over before she can go to a facility.   CSW provided supportive counseling to patient's daughter this afternoon as she had been told some distressing news that if her mother does not show improvement that it may be time to consider hospice services. CSW allowed patient's daughter to process.  Employment status:  Retired Forensic scientist:  Medicare PT Recommendations:  [place] / Referral to community resources:     Patient/Family's Response to care:  Patient's daughter expressed appreciation for CSW assistance.  Patient/Family's Understanding of and Emotional Response to Diagnosis, Current Treatment, and Prognosis:  Patient's daughter's mood was one of being worried for her mother and hopeful that she will turn around.  Emotional Assessment Appearance:  Appears stated age Attitude/Demeanor/Rapport:  (pleasant and cooperative) Affect (typically observed):    Orientation:  Oriented to Self Alcohol / Substance use:  Not Applicable Psych involvement (Current and /or in the community):  No (Comment)  Discharge Needs  Concerns to be addressed:  Care Coordination Readmission within the last 30 days:  No Current discharge risk:  None Barriers to Discharge:  Continued Medical Work up   [employer], [place] 05/02/2018, 2:32 PM

## 2018-05-02 NOTE — Progress Notes (Signed)
Physical Therapy Evaluation Patient Details Name: Theresa Sloan MRN: [id number] DOB: 1941-05-09 Today's Date: 05/02/2018   History of Present Illness  Theresa Sloan  is a 77 y.o. female with a known history of advanced Alzheimer's Dementia, Parkinson's disease, hypertension, hyperlipidemia, anxiety who presents to the hospital due to recurrent falls and generalized weakness. History obtained from the daughter at bedside.  As per the daughter patient has had 5 falls in the past month.  She was found on the floor at her assisted living facility, [facility].  She was brought to the ER for further evaluation.  On routine blood work patient was noted to have a urinary tract infection.  Patient was suspected to have a UTI at her assisted living and started on oral Bactrim.  Despite being on antibiotics she feels increasingly weak, she is not eating well and has had recurrent falls and therefore brought to the ER for further evaluation. She is currently admitted for UTI and recurrent falls. Pt broke out with a rash yesterday and was placed on airborne precautions for suspected shingles.   Clinical Impression  Pt admitted with above diagnosis. Pt currently with functional limitations due to the deficits listed below (see PT Problem List). Pt requires modA+1 for bed mobility, transfers, and very limited ambulation forward/backward at EOB. She requires intermittent minA+1 to support balance in sitting. She has a history of dementia and is AOx1 at time of PT evaluation which is close to her baseline. She is able to follow approximately 70% of simple one step commands. Pt is typically ambulatory at her facility with a rolling walker and is far below her baseline at this time. Recommend SNF placement in order to safely transition back to memory care unit. Daughter who is HPOA is in agreement with this plan. Pt will benefit from PT services to address deficits in strength, balance, and mobility in order to  return to full function at home.       Follow Up Recommendations SNF    Equipment Recommendations  None recommended by PT    Recommendations for Other Services       Precautions / Restrictions Precautions Precautions: Fall Restrictions Weight Bearing Restrictions: No      Mobility  Bed Mobility Overal bed mobility: Needs Assistance Bed Mobility: Supine to Sit;Sit to Supine     Supine to sit: Mod assist Sit to supine: Mod assist   General bed mobility comments: Verbal and tactile cues for hand placement and sequencing. Initially requires support to maintain balance in sitting but gradually improves to CGA. However she requires intermittent minA+1 support and repeatedly tries to lay back down into bed  Transfers Overall transfer level: Needs assistance Equipment used: Rolling walker (2 wheeled) Transfers: Sit to/from Stand Sit to Stand: Mod assist         General transfer comment: Cues for sequencing and hand placement during transfers. Pt with posterior LOB and requires assist to prevent her from falling.   Ambulation/Gait Ambulation/Gait assistance: Mod assist Ambulation Distance (Feet): 3 Feet Assistive device: Rolling walker (2 wheeled) Gait Pattern/deviations: Decreased step length - right;Decreased step length - left;Shuffle     General Gait Details: Pt is only able to take short, shuffling steps from bed to counter and backwards. Cues and assist for advancing walker and modA+1 to remain upright. Unsafe to ambulate farther at this time  Stairs            Wheelchair Mobility    Modified Rankin (Stroke Patients Only)  Balance Overall balance assessment: Needs assistance Sitting-balance support: No upper extremity supported Sitting balance-Leahy Scale: Poor     Standing balance support: Bilateral upper extremity supported Standing balance-Leahy Scale: Poor Standing balance comment: Constant modA+1 for balance                              Pertinent Vitals/Pain Pain Assessment: Faces Faces Pain Scale: No hurt    Home Living Family/patient expects to be discharged to:: Unsure               Home Equipment: Gilford Rile - 2 wheels Additional Comments: Pt lives at [facility]    Prior Function Level of Independence: Needs assistance   Gait / Transfers Assistance Needed: Pt ambulates with rolling walker at her facility. She frequent forgets to use her walker and per daughter her falls occur when she doesn't use her walker  ADL's / Homemaking Assistance Needed: Pt requires assist from staff at ALF for ADLs/IADLs        Hand Dominance        Extremity/Trunk Assessment   Upper Extremity Assessment Upper Extremity Assessment: Generalized weakness;Difficult to assess due to impaired cognition    Lower Extremity Assessment Lower Extremity Assessment: Generalized weakness;Difficult to assess due to impaired cognition       Communication   Communication: No difficulties  Cognition Arousal/Alertness: Lethargic Behavior During Therapy: Flat affect Overall Cognitive Status: History of cognitive impairments - at baseline                                 General Comments: Pt is AOx1 at time of evaluation, unable to provide accurate DOB. Is able to follow approximately 70% of simple single step commands      General Comments      Exercises General Exercises - Lower Extremity Long Arc Quad: Both;10 reps;Seated Hip ABduction/ADduction: Both;10 reps;Seated Hip Flexion/Marching: Both;10 reps;Seated   Assessment/Plan    PT Assessment Patient needs continued PT services  PT Problem List Decreased strength;Decreased activity tolerance;Decreased balance;Decreased cognition;Decreased safety awareness       PT Treatment Interventions DME instruction;Gait training;Functional mobility training;Therapeutic activities;Therapeutic exercise;Balance training;Patient/family education     PT Goals (Current goals can be found in the Care Plan section)  Acute Rehab PT Goals Patient Stated Goal: Improve strength and mobility PT Goal Formulation: With family Time For Goal Achievement: 05/16/18 Potential to Achieve Goals: Good    Frequency Min 2X/week   Barriers to discharge        Co-evaluation               AM-PAC PT "6 Clicks" Daily Activity  Outcome Measure Difficulty turning over in bed (including adjusting bedclothes, sheets and blankets)?: Unable Difficulty moving from lying on back to sitting on the side of the bed? : Unable Difficulty sitting down on and standing up from a chair with arms (e.g., wheelchair, bedside commode, etc,.)?: Unable Help needed moving to and from a bed to chair (including a wheelchair)?: A Little Help needed walking in hospital room?: A Lot Help needed climbing 3-5 steps with a railing? : Total 6 Click Score: 9    End of Session Equipment Utilized During Treatment: Gait belt Activity Tolerance: Patient tolerated treatment well Patient left: in bed;with call bell/phone within reach;with bed alarm set;with family/visitor present Nurse Communication: Mobility status;Other (comment)(Need to be changed) PT Visit Diagnosis:  Unsteadiness on feet (R26.81);Repeated falls (R29.6);Muscle weakness (generalized) (M62.81);Difficulty in walking, not elsewhere classified (R26.2)    Time: 0017-4944 PT Time Calculation (min) (ACUTE ONLY): 30 min   Charges:   PT Evaluation $PT Eval Moderate Complexity: 1 Mod PT Treatments $Therapeutic Exercise: 8-22 mins   PT G Codes:        Lyndel Safe Enio Hornback PT, DPT    Mckinley Olheiser 05/02/2018, 11:18 AM

## 2018-05-02 NOTE — Progress Notes (Signed)
[facility] NAME: Theresa Sloan    MR#:  [id number]  DATE OF BIRTH:  1941-05-17  SUBJECTIVE:  CHIEF COMPLAINT: Patient is not eating or drinking much.  Frequent falls.  Daughter at bedside, in tears  REVIEW OF SYSTEMS:  Review of system unobtainable as the patient is demented and altered.   DRUG ALLERGIES:  No Known Allergies  VITALS:  Blood pressure (!) 97/56, pulse 61, temperature 97.7 F (36.5 C), temperature source Oral, resp. rate 16, height 5\' 2"  (1.575 m), weight 50.9 kg (112 lb 4.8 oz), SpO2 99 %.  PHYSICAL EXAMINATION:  GENERAL:  77 y.o.-year-old patient lying in the bed with no acute distress.  EYES: Pupils equal, round, reactive to light and accommodation. No scleral icterus. Extraocular muscles intact.  Left eye with periorbital hematoma HEENT: Head atraumatic, normocephalic. Oropharynx and nasopharynx clear.  NECK:  Supple, no jugular venous distention. No thyroid enlargement, no tenderness.  LUNGS: Normal breath sounds bilaterally, no wheezing, rales,rhonchi or crepitation. No use of accessory muscles of respiration.  CARDIOVASCULAR: S1, S2 normal. No murmurs, rubs, or gallops.  ABDOMEN: Soft, nontender, nondistended. Bowel sounds present. No organomegaly or mass.  EXTREMITIES: No pedal edema, cyanosis, or clubbing.  NEUROLOGIC: Cranial nerves II through XII are intact. Muscle strength 5/5 in all extremities. Sensation intact. Gait not checked.  PSYCHIATRIC: The patient is alert and oriented x 3.  SKIN: No obvious rash, lesion, or ulcer.    LABORATORY PANEL:   CBC Recent Labs  Lab 05/02/18 0404  WBC 8.6  HGB 12.6  HCT 37.1  PLT 209   ------------------------------------------------------------------------------------------------------------------  Chemistries  Recent Labs  Lab 05/01/18 1505 05/02/18 0404  NA 134* 137  K 3.8 4.0  CL 100* 105  CO2 21* 24  GLUCOSE 122* 91  BUN 17 13   CREATININE 1.09* 0.90  CALCIUM 9.0 8.7*  AST 36  --   ALT 17  --   ALKPHOS 83  --   BILITOT 0.4  --    ------------------------------------------------------------------------------------------------------------------  Cardiac Enzymes Recent Labs  Lab 05/01/18 1505  TROPONINI <0.03   ------------------------------------------------------------------------------------------------------------------  RADIOLOGY:  Ct Head Wo Contrast  Result Date: 05/01/2018 CLINICAL DATA:  Unwitnessed fall. Left eye bruising. History of dementia. Initial encounter. EXAM: CT HEAD WITHOUT CONTRAST CT MAXILLOFACIAL WITHOUT CONTRAST CT CERVICAL SPINE WITHOUT CONTRAST TECHNIQUE: Multidetector CT imaging of the head, cervical spine, and maxillofacial structures were performed using the standard protocol without intravenous contrast. Multiplanar CT image reconstructions of the cervical spine and maxillofacial structures were also generated. COMPARISON:  Head and cervical spine CT 04/13/2018. Maxillofacial CT 04/08/2018. FINDINGS: CT HEAD FINDINGS Brain: There is no evidence of acute infarct, intracranial hemorrhage, mass, midline shift, or extra-axial fluid collection. Moderately advanced cerebral atrophy is again noted with asymmetric enlargement of the right lateral ventricle, unchanged. Periventricular white matter hypodensities are unchanged and nonspecific but compatible with chronic small vessel ischemic disease, minimal for age. Vascular: Calcified atherosclerosis at the skull base. No hyperdense vessel. Skull: No acute fracture or focal osseous lesion. Other: Resolved right parietal scalp hematoma. Small left supraorbital/frontal scalp hematoma, new. Essentially resolved right frontal scalp hematoma. CT MAXILLOFACIAL FINDINGS Osseous: Old nasal bone fracture. No acute fracture or mandibular dislocation. Moderate bilateral TMJ arthrosis. Orbits: Bilateral cataract extraction. Left scleral buckle. Sinuses: Small  right maxillary sinus mucous retention cyst. Clear mastoid air cells. Soft tissues: Small left supraorbital hematoma. CT CERVICAL SPINE FINDINGS Alignment: Unchanged slight retrolisthesis of C2 on C3  and C4 on C5 and slight anterolisthesis of C7 on T1. Skull base and vertebrae: No acute fracture or destructive osseous lesion. Soft tissues and spinal canal: No prevertebral fluid or swelling. No visible canal hematoma. Disc levels: Moderate to severe disc space narrowing at C2-3, C4-5, C5-6, and C6-7. Moderate left greater than right multilevel facet arthrosis. Moderate to severe neural foraminal stenosis from C3-4 to C6-7. No significant osseous spinal canal stenosis. Upper chest: Mild motion artifact through the lung apices with partially visualized mild streaky opacity in the posterior right upper lobe and superior segment of the right lower lobe. Other: Carotid artery atherosclerosis. IMPRESSION: 1. No evidence of acute intracranial abnormality. 2. Small left supraorbital hematoma. 3. No acute maxillofacial fracture. 4. No acute fracture or traumatic subluxation identified in the cervical spine. 5. Partially visualized mild opacity in the posterior right upper lung. Correlate for any acute pulmonary symptoms and consider further evaluation with chest radiographs. Electronically Signed   By: Logan Bores M.D.   On: 05/01/2018 14:44   Ct Cervical Spine Wo Contrast  Result Date: 05/01/2018 CLINICAL DATA:  Unwitnessed fall. Left eye bruising. History of dementia. Initial encounter. EXAM: CT HEAD WITHOUT CONTRAST CT MAXILLOFACIAL WITHOUT CONTRAST CT CERVICAL SPINE WITHOUT CONTRAST TECHNIQUE: Multidetector CT imaging of the head, cervical spine, and maxillofacial structures were performed using the standard protocol without intravenous contrast. Multiplanar CT image reconstructions of the cervical spine and maxillofacial structures were also generated. COMPARISON:  Head and cervical spine CT 04/13/2018. Maxillofacial  CT 04/08/2018. FINDINGS: CT HEAD FINDINGS Brain: There is no evidence of acute infarct, intracranial hemorrhage, mass, midline shift, or extra-axial fluid collection. Moderately advanced cerebral atrophy is again noted with asymmetric enlargement of the right lateral ventricle, unchanged. Periventricular white matter hypodensities are unchanged and nonspecific but compatible with chronic small vessel ischemic disease, minimal for age. Vascular: Calcified atherosclerosis at the skull base. No hyperdense vessel. Skull: No acute fracture or focal osseous lesion. Other: Resolved right parietal scalp hematoma. Small left supraorbital/frontal scalp hematoma, new. Essentially resolved right frontal scalp hematoma. CT MAXILLOFACIAL FINDINGS Osseous: Old nasal bone fracture. No acute fracture or mandibular dislocation. Moderate bilateral TMJ arthrosis. Orbits: Bilateral cataract extraction. Left scleral buckle. Sinuses: Small right maxillary sinus mucous retention cyst. Clear mastoid air cells. Soft tissues: Small left supraorbital hematoma. CT CERVICAL SPINE FINDINGS Alignment: Unchanged slight retrolisthesis of C2 on C3 and C4 on C5 and slight anterolisthesis of C7 on T1. Skull base and vertebrae: No acute fracture or destructive osseous lesion. Soft tissues and spinal canal: No prevertebral fluid or swelling. No visible canal hematoma. Disc levels: Moderate to severe disc space narrowing at C2-3, C4-5, C5-6, and C6-7. Moderate left greater than right multilevel facet arthrosis. Moderate to severe neural foraminal stenosis from C3-4 to C6-7. No significant osseous spinal canal stenosis. Upper chest: Mild motion artifact through the lung apices with partially visualized mild streaky opacity in the posterior right upper lobe and superior segment of the right lower lobe. Other: Carotid artery atherosclerosis. IMPRESSION: 1. No evidence of acute intracranial abnormality. 2. Small left supraorbital hematoma. 3. No acute  maxillofacial fracture. 4. No acute fracture or traumatic subluxation identified in the cervical spine. 5. Partially visualized mild opacity in the posterior right upper lung. Correlate for any acute pulmonary symptoms and consider further evaluation with chest radiographs. Electronically Signed   By: Logan Bores M.D.   On: 05/01/2018 14:44   Dg Chest Portable 1 View  Result Date: 05/01/2018 CLINICAL DATA:  Fall. EXAM: PORTABLE  CHEST 1 VIEW COMPARISON:  Multiple chest x-rays since January 04, 2017 FINDINGS: Focal infiltrate in the lateral right upper lung, not clearly seen previously. No other evidence of nodule, mass, or infiltrate. No pneumothorax. The cardiomediastinal silhouette is unremarkable. IMPRESSION: 1. Mild focal infiltrate in the lateral right upper lung. Recommend short-term follow-up to ensure resolution. Electronically Signed   By: Dorise Bullion III M.D   On: 05/01/2018 15:32   Dg Humerus Left  Result Date: 05/01/2018 CLINICAL DATA:  Bruise after fall. EXAM: LEFT HUMERUS - 2+ VIEW COMPARISON:  None. FINDINGS: There is no evidence of fracture or other focal bone lesions. Soft tissues are unremarkable. IMPRESSION: Negative. Electronically Signed   By: Dorise Bullion III M.D   On: 05/01/2018 14:56   Ct Maxillofacial Wo Contrast  Result Date: 05/01/2018 CLINICAL DATA:  Unwitnessed fall. Left eye bruising. History of dementia. Initial encounter. EXAM: CT HEAD WITHOUT CONTRAST CT MAXILLOFACIAL WITHOUT CONTRAST CT CERVICAL SPINE WITHOUT CONTRAST TECHNIQUE: Multidetector CT imaging of the head, cervical spine, and maxillofacial structures were performed using the standard protocol without intravenous contrast. Multiplanar CT image reconstructions of the cervical spine and maxillofacial structures were also generated. COMPARISON:  Head and cervical spine CT 04/13/2018. Maxillofacial CT 04/08/2018. FINDINGS: CT HEAD FINDINGS Brain: There is no evidence of acute infarct, intracranial hemorrhage,  mass, midline shift, or extra-axial fluid collection. Moderately advanced cerebral atrophy is again noted with asymmetric enlargement of the right lateral ventricle, unchanged. Periventricular white matter hypodensities are unchanged and nonspecific but compatible with chronic small vessel ischemic disease, minimal for age. Vascular: Calcified atherosclerosis at the skull base. No hyperdense vessel. Skull: No acute fracture or focal osseous lesion. Other: Resolved right parietal scalp hematoma. Small left supraorbital/frontal scalp hematoma, new. Essentially resolved right frontal scalp hematoma. CT MAXILLOFACIAL FINDINGS Osseous: Old nasal bone fracture. No acute fracture or mandibular dislocation. Moderate bilateral TMJ arthrosis. Orbits: Bilateral cataract extraction. Left scleral buckle. Sinuses: Small right maxillary sinus mucous retention cyst. Clear mastoid air cells. Soft tissues: Small left supraorbital hematoma. CT CERVICAL SPINE FINDINGS Alignment: Unchanged slight retrolisthesis of C2 on C3 and C4 on C5 and slight anterolisthesis of C7 on T1. Skull base and vertebrae: No acute fracture or destructive osseous lesion. Soft tissues and spinal canal: No prevertebral fluid or swelling. No visible canal hematoma. Disc levels: Moderate to severe disc space narrowing at C2-3, C4-5, C5-6, and C6-7. Moderate left greater than right multilevel facet arthrosis. Moderate to severe neural foraminal stenosis from C3-4 to C6-7. No significant osseous spinal canal stenosis. Upper chest: Mild motion artifact through the lung apices with partially visualized mild streaky opacity in the posterior right upper lobe and superior segment of the right lower lobe. Other: Carotid artery atherosclerosis. IMPRESSION: 1. No evidence of acute intracranial abnormality. 2. Small left supraorbital hematoma. 3. No acute maxillofacial fracture. 4. No acute fracture or traumatic subluxation identified in the cervical spine. 5. Partially  visualized mild opacity in the posterior right upper lung. Correlate for any acute pulmonary symptoms and consider further evaluation with chest radiographs. Electronically Signed   By: Logan Bores M.D.   On: 05/01/2018 14:44    EKG:   Orders placed or performed during the hospital encounter of 05/01/18  . ED EKG  . ED EKG    ASSESSMENT AND PLAN:    77 year old female with past medical history of Alzheimer's dementia, Parkinson's disease, hypertension, previous history of urinary tract infection, GERD, anxiety who presents to the hospital due to recurrent falls, generalized weakness  and poor p.o. intake and noted to have a urinary tract infection.  1.  Urinary tract infection- this is based on a urinalysis on admission. -Patient was apparently being treated with oral Bactrim as an outpatient but not responding.  We will treat the patient with IV ceftriaxone, follow urine cultures.  Patient is currently afebrile and hemodynamically stable with a normal white cell count.  2.  Recurrent falls/generalized weakness-multifactorial in nature.  Related to underlying dementia with Parkinson's disease combined with 5 to thrive and also underlying UTI. -We will treat the patient's urinary tract infection, will follow her clinically.  We will get a physical therapy consult to assess her mobility.  3.    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  Dementia-patient is high risk for sundowning. -Continue Lamictal, Namenda, Exelon patch. -We will order some as needed Haldol for agitation.  5. History of Parkinson's disease-continue Sinemet.   6.  Chest x-ray with infiltrate- pneumonia continue IV Rocephin and added azithromycin, total antibiotic course will be 5 days ,breathing treatments as needed and sputum culture and sensitivity if we can get a sample-   7.GERD-continue Protonix.  8.  Failure to  thrive-dietary consult, speech therapy consult If no improvement will consider palliative care consult discussed with the daughter agreeable      All the records are reviewed and case discussed with Care Management/Social Workerr. Management plans discussed with the patient daughter at bedsides and they are in agreement.  CODE STATUS: fc   TOTAL TIME TAKING CARE OF THIS PATIENT: 37 minutes.   POSSIBLE D/C IN 2 DAYS, DEPENDING ON CLINICAL CONDITION.  Note: This dictation was prepared with Dragon dictation along with smaller phrase technology. Any transcriptional errors that result from this process are unintentional.   Nicholes Mango M.D on 05/02/2018 at 5:46 PM  Between 7am to 6pm - Pager - [phone] After 6pm go to [email or web address] - password [email or web address] [facility]  [facility]  Office  [phone]  CC: Primary care physician; [name], MD

## 2018-05-02 NOTE — Telephone Encounter (Signed)
Pt requesting a call, stating the pt had a fall yesterday at [facility] and has been admitted to [facility] for weakness and a UTI. Please call to discuss

## 2018-05-02 NOTE — Progress Notes (Signed)
Initial Nutrition Assessment  DOCUMENTATION CODES:   Non-severe (moderate) malnutrition in context of chronic illness  INTERVENTION:   Vital Cuisine TID, each supplement provides 520kcal and 22g of protein.   Magic cup TID with meals, each supplement provides 290 kcal and 9 grams of protein  MVI daily   NUTRITION DIAGNOSIS:   Moderate Malnutrition related to chronic illness(dementia, parkinsons ) as evidenced by moderate fat depletion, moderate muscle depletion.  GOAL:   Patient will meet greater than or equal to 90% of their needs  MONITOR:   PO intake, Supplement acceptance, Labs, Weight trends, Skin, I & O's  REASON FOR ASSESSMENT:   Malnutrition Screening Tool    ASSESSMENT:   77 y.o. female with a known history of advanced Alzheimer's dementia, Parkinson's disease, hypertension, hyperlipidemia, anxiety who presents to the hospital due to recurrent falls and generalized weakness.     Visited pt's room today; unable to speak to pt r/t dementia so history obtained from pt's daughter at bedside. Per pt's daughter, pt with decreased appetite and oral intake for the past few days. Daughter reports that at baseline, pt is a good eater. Pt has been holding food in her mouth and not swallowing today. Pt had some trouble swallowing her pills earlier and has been taking crushed pills in applesauce. Pt does drink Mighty shakes and Ensure at the SN[facility] where she resides. Pt seen by SLP today recommended for dysphagia 1/ thin liquid diet. Per daughter, pt's UBW is around 115-118lbs; it appears pt has lost ~3lbs; RD is unsure of the time frame in which the weight was lost. RD will order supplements and MVI.   Medications reviewed and include: lovenox, protonix, NaCl @75ml /hr, azithromycin, ceftriaxone  Labs reviewed:   NUTRITION - FOCUSED PHYSICAL EXAM:    Most Recent Value  Orbital Region  Mild depletion  Upper Arm Region  Moderate depletion  Thoracic and Lumbar Region  Moderate  depletion  Buccal Region  No depletion  Temple Region  Mild depletion  Clavicle Bone Region  Moderate depletion  Clavicle and Acromion Bone Region  Moderate depletion  Scapular Bone Region  Moderate depletion  Dorsal Hand  Severe depletion  Patellar Region  Moderate depletion  Anterior Thigh Region  Moderate depletion  Posterior Calf Region  Moderate depletion  Edema (RD Assessment)  None  Hair  Reviewed  Eyes  Reviewed  Mouth  Reviewed  Skin  Reviewed  Nails  Reviewed     Diet Order:   Diet Order           DIET - DYS 1 Room service appropriate? Yes with Assist; Fluid consistency: Thin  Diet effective now         EDUCATION NEEDS:   Education needs have been addressed(with pt's daughter)  Skin:  Skin Assessment: Reviewed RN Assessment(ecchymosis, skin tears)  Last BM:  pta  Height:   Ht Readings from Last 1 Encounters:  05/01/18 5\' 2"  (1.575 m)    Weight:   Wt Readings from Last 1 Encounters:  05/01/18 112 lb 4.8 oz (50.9 kg)    Ideal Body Weight:  50 kg  BMI:  Body mass index is 20.54 kg/m.  Estimated Nutritional Needs:   Kcal:  1200-1400kcal/day   Protein:  66-76g/day  Fluid:  >1.2L/day   Koleen Distance MS, RD, LDN Pager #- [phone] Office#- [phone] After Hours Pager: [phone]

## 2018-05-02 NOTE — Consult Note (Addendum)
[facility] Infectious Disease     Reason for Consult: Rash  Referring Physician: Gouru, A Date of Admission:  05/01/2018   Active Problems:   UTI (urinary tract infection)   Malnutrition of moderate degree   HPI: Theresa Sloan is a 77 y.o. female admitted from NH with weakness and falls. She has hx of AD, PD, htn hld and anxieyy and has mainly been having increasing falls at assisted livign. On admit no fevers and wbc nml. She had been dxed with UTI at facility a few days prior and was on bactrim, cx with E coli  S to bactrim.  She also has a recurrent rash on her buttocks region per her daughter occurring off an on for many years, especially when ill or stressed. Pt can provide no history.   Past Medical History:  Diagnosis Date  . Alzheimer disease   . Anxiety   . Dementia   . 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 Gait difficulty 03/05/2015  . GERD (gastroesophageal reflux disease)   . Hyperlipidemia   . Hypertension   . Memory difficulty 08/07/2014  . TMJ (dislocation of temporomandibular joint)    Past Surgical History:  Procedure Laterality Date  . CESAREAN SECTION  1992  . RETINAL TEAR REPAIR CRYOTHERAPY     Social History   Tobacco Use  . Smoking status: Never Smoker  . Smokeless tobacco: Never Used  Substance Use Topics  . Alcohol use: No  . Drug use: No   Family History  Problem Relation Age of Onset  . Hypertension Sister   . Tremor Sister   . Brain cancer Mother     Allergies: No Known Allergies  Current antibiotics: Antibiotics Given (last 72 hours)    Date/Time Action Medication Dose Rate   05/01/18 1737 New Bag/Given   cefTRIAXone (ROCEPHIN) 1 g in sodium chloride 0.9 % 100 mL IVPB 1 g 200 mL/hr   05/02/18 1110 Given   valACYclovir (VALTREX) tablet 1,000 mg 1,000 mg    05/02/18 1348 New Bag/Given   azithromycin (ZITHROMAX) 500 mg in sodium chloride 0.9 % 250 mL IVPB 500 mg 250 mL/hr      MEDICATIONS: . atorvastatin   80 mg Oral QPM  . carbidopa-levodopa  1 tablet Oral TID   And  . entacapone  200 mg Oral TID  . diphenhydrAMINE  25 mg Intravenous Once  . enoxaparin (LOVENOX) injection  40 mg Subcutaneous Q24H  . escitalopram  20 mg Oral Daily  . lamoTRIgine  50 mg Oral BID  . memantine  10 mg Oral BID  . multivitamin with minerals  1 tablet Oral Daily  . pantoprazole  40 mg Oral Daily  . propranolol ER  60 mg Oral Daily  . rivastigmine  9.5 mg Transdermal Daily  . valACYclovir  1,000 mg Oral BID    Review of Systems -unable to obtain  OBJECTIVE: Temp:  [98.6 F (37 C)-98.8 F (37.1 C)] 98.8 F (37.1 C) (06/03 0649) Pulse Rate:  [51-58] 58 (06/03 0649) Resp:  [13-18] 18 (06/03 0649) BP: (96-126)/(54-78) 125/54 (06/03 0649) SpO2:  [97 %-98 %] 98 % (06/03 0649) Physical Exam  Constitutional:  Frail, demented.  HENT: [place]/AT, PERRLA, no scleral icterus ecchymosis on L eye Mouth/Throat: Oropharynx is clear and dry Cardiovascular: Normal rate, regular rhythm and normal heart sounds.  Pulmonary/Chest: Effort normal and breath sounds normal. No respiratory distress.  Abdominal: Soft. Bowel sounds are normal.  exhibits no distension. There is no  tenderness.  Lymphadenopathy: no cervical adenopathy. No axillary adenopathy Neurological: unable to provide history Skin: R buttock with a cluster of 4 small vesicles   LABS: Results for orders placed or performed during the hospital encounter of 05/01/18 (from the past 48 hour(s))  Troponin I     Status: None   Collection Time: 05/01/18  3:05 PM  Result Value Ref Range   Troponin I <0.03 <0.03 ng/mL    Comment: Performed at [facility], [address] [place], [place] [address]  Comprehensive metabolic panel     Status: Abnormal   Collection Time: 05/01/18  3:05 PM  Result Value Ref Range   Sodium 134 (L) 135 - 145 mmol/L   Potassium 3.8 3.5 - 5.1 mmol/L   Chloride 100 (L) 101 - 111 mmol/L   CO2 21 (L) 22 - 32 mmol/L   Glucose, Bld  122 (H) 65 - 99 mg/dL   BUN 17 6 - 20 mg/dL   Creatinine, Ser 1.09 (H) 0.44 - 1.00 mg/dL   Calcium 9.0 8.9 - 10.3 mg/dL   Total Protein 7.1 6.5 - 8.1 g/dL   Albumin 3.9 3.5 - 5.0 g/dL   AST 36 15 - 41 U/L   ALT 17 14 - 54 U/L   Alkaline Phosphatase 83 38 - 126 U/L   Total Bilirubin 0.4 0.3 - 1.2 mg/dL   GFR calc non Af Amer 48 (L) >60 mL/min   GFR calc Af Amer 56 (L) >60 mL/min    Comment: (NOTE) The eGFR has been calculated using the CKD EPI equation. This calculation has not been validated in all clinical situations. eGFR's persistently <60 mL/min signify possible Chronic Kidney Disease.    Anion gap 13 5 - 15    Comment: Performed at [facility], [address] [place], [place] [address]  CBC with Differential     Status: Abnormal   Collection Time: 05/01/18  3:05 PM  Result Value Ref Range   WBC 8.8 3.6 - 11.0 K/uL   RBC 3.87 3.80 - 5.20 MIL/uL   Hemoglobin 13.4 12.0 - 16.0 g/dL   HCT 38.7 35.0 - 47.0 %   MCV 100.0 80.0 - 100.0 fL   MCH 34.7 (H) 26.0 - 34.0 pg   MCHC 34.7 32.0 - 36.0 g/dL   RDW 13.9 11.5 - 14.5 %   Platelets 220 150 - 440 K/uL   Neutrophils Relative % 70 %   Neutro Abs 6.3 1.4 - 6.5 K/uL   Lymphocytes Relative 18 %   Lymphs Abs 1.6 1.0 - 3.6 K/uL   Monocytes Relative 10 %   Monocytes Absolute 0.9 0.2 - 0.9 K/uL   Eosinophils Relative 1 %   Eosinophils Absolute 0.0 0 - 0.7 K/uL   Basophils Relative 1 %   Basophils Absolute 0.1 0 - 0.1 K/uL    Comment: Performed at [facility], [address] [place], [place] [address]  Urinalysis, Complete w Microscopic     Status: Abnormal   Collection Time: 05/01/18  4:12 PM  Result Value Ref Range   Color, Urine AMBER (A) YELLOW    Comment: BIOCHEMICALS MAY BE AFFECTED BY COLOR   APPearance TURBID (A) CLEAR   Specific Gravity, Urine 1.008 1.005 - 1.030   pH 6.0 5.0 - 8.0   Glucose, UA NEGATIVE NEGATIVE mg/dL   Hgb urine dipstick SMALL (A) NEGATIVE   Bilirubin Urine NEGATIVE NEGATIVE    Ketones, ur NEGATIVE NEGATIVE mg/dL   Protein, ur 30 (  A) NEGATIVE mg/dL   Nitrite NEGATIVE NEGATIVE   Leukocytes, UA LARGE (A) NEGATIVE   RBC / HPF >50 (H) 0 - 5 RBC/hpf   WBC, UA >50 (H) 0 - 5 WBC/hpf   Bacteria, UA FEW (A) NONE SEEN   Squamous Epithelial / LPF 11-20 0 - 5    Comment: Performed at [facility], [address], [place], [place] [address]  MRSA PCR Screening     Status: None   Collection Time: 05/01/18  6:56 PM  Result Value Ref Range   MRSA by PCR NEGATIVE NEGATIVE    Comment:        The GeneXpert MRSA Assay (FDA approved for NASAL specimens only), is one component of a comprehensive MRSA colonization surveillance program. It is not intended to diagnose MRSA infection nor to guide or monitor treatment for MRSA infections. Performed at [facility], [address] [place], [place] [address]   Basic metabolic panel     Status: Abnormal   Collection Time: 05/02/18  4:04 AM  Result Value Ref Range   Sodium 137 135 - 145 mmol/L   Potassium 4.0 3.5 - 5.1 mmol/L   Chloride 105 101 - 111 mmol/L   CO2 24 22 - 32 mmol/L   Glucose, Bld 91 65 - 99 mg/dL   BUN 13 6 - 20 mg/dL   Creatinine, Ser 0.90 0.44 - 1.00 mg/dL   Calcium 8.7 (L) 8.9 - 10.3 mg/dL   GFR calc non Af Amer >60 >60 mL/min   GFR calc Af Amer >60 >60 mL/min    Comment: (NOTE) The eGFR has been calculated using the CKD EPI equation. This calculation has not been validated in all clinical situations. eGFR's persistently <60 mL/min signify possible Chronic Kidney Disease.    Anion gap 8 5 - 15    Comment: Performed at [facility], [address] [place], [place] [address]  CBC     Status: Abnormal   Collection Time: 05/02/18  4:04 AM  Result Value Ref Range   WBC 8.6 3.6 - 11.0 K/uL   RBC 3.73 (L) 3.80 - 5.20 MIL/uL   Hemoglobin 12.6 12.0 - 16.0 g/dL   HCT 37.1 35.0 - 47.0 %   MCV 99.6 80.0 - 100.0 fL   MCH 33.7 26.0 - 34.0 pg   MCHC 33.8 32.0 - 36.0 g/dL   RDW  14.0 11.5 - 14.5 %   Platelets 209 150 - 440 K/uL    Comment: Performed at [facility], [address] [place], [place] [address]   No components found for: ESR, C REACTIVE PROTEIN MICRO: Recent Results (from the past 720 hour(s))  Urine Culture     Status: Abnormal   Collection Time: 04/22/18 10:12 AM  Result Value Ref Range Status   MICRO NUMBER: [id number]  Final   SPECIMEN QUALITY: ADEQUATE  Final   Sample Source URINE  Final   STATUS: FINAL  Final   ISOLATE 1: Escherichia coli (A)  Final    Comment: 10,000-50,000 CFU/mL of Escherichia coli      Susceptibility   Escherichia coli - URINE CULTURE, REFLEX    AMOX/CLAVULANIC 4 Sensitive     AMPICILLIN 4 Sensitive     AMPICILLIN/SULBACTAM <=2 Sensitive     CEFAZOLIN* <=4 Not Reportable      * For infections other than uncomplicated UTIcaused by E. coli, K. pneumoniae or P. mirabilis:Cefazolin is resistant if MIC > or = 8 mcg/mL.(Distinguishing susceptible versus intermediatefor isolates  with MIC < or = 4 mcg/mL requiresadditional testing.)For uncomplicated UTI caused by E. coli,K. pneumoniae or P. mirabilis: Cefazolin issusceptible if MIC <32 mcg/mL and predictssusceptible to the oral agents cefaclor, cefdinir,cefpodoxime, cefprozil, cefuroxime, cephalexinand loracarbef.    CEFEPIME <=1 Sensitive     CEFTRIAXONE <=1 Sensitive     CIPROFLOXACIN <=0.25 Sensitive     LEVOFLOXACIN <=0.12 Sensitive     ERTAPENEM <=0.5 Sensitive     GENTAMICIN <=1 Sensitive     IMIPENEM <=0.25 Sensitive     NITROFURANTOIN <=16 Sensitive     PIP/TAZO <=4 Sensitive     TOBRAMYCIN <=1 Sensitive     TRIMETH/SULFA* <=20 Sensitive      * For infections other than uncomplicated UTIcaused by E. coli, K. pneumoniae or P. mirabilis:Cefazolin is resistant if MIC > or = 8 mcg/mL.(Distinguishing susceptible versus intermediatefor isolates with MIC < or = 4 mcg/mL requiresadditional testing.)For uncomplicated UTI caused by E. coli,K. pneumoniae or P.  mirabilis: Cefazolin issusceptible if MIC <32 mcg/mL and predictssusceptible to the oral agents cefaclor, cefdinir,cefpodoxime, cefprozil, cefuroxime, cephalexinand loracarbef.Legend:S = Susceptible  I = IntermediateR = Resistant  NS = Not susceptible* = Not tested  NR = Not reported**NN = See antimicrobic comments  MRSA PCR Screening     Status: None   Collection Time: 05/01/18  6:56 PM  Result Value Ref Range Status   MRSA by PCR NEGATIVE NEGATIVE Final    Comment:        The GeneXpert MRSA Assay (FDA approved for NASAL specimens only), is one component of a comprehensive MRSA colonization surveillance program. It is not intended to diagnose MRSA infection nor to guide or monitor treatment for MRSA infections. Performed at [facility], [address] [place], [place] [address]     IMAGING: Ct Head Wo Contrast  Result Date: 05/01/2018 CLINICAL DATA:  Unwitnessed fall. Left eye bruising. History of dementia. Initial encounter. EXAM: CT HEAD WITHOUT CONTRAST CT MAXILLOFACIAL WITHOUT CONTRAST CT CERVICAL SPINE WITHOUT CONTRAST TECHNIQUE: Multidetector CT imaging of the head, cervical spine, and maxillofacial structures were performed using the standard protocol without intravenous contrast. Multiplanar CT image reconstructions of the cervical spine and maxillofacial structures were also generated. COMPARISON:  Head and cervical spine CT 04/13/2018. Maxillofacial CT 04/08/2018. FINDINGS: CT HEAD FINDINGS Brain: There is no evidence of acute infarct, intracranial hemorrhage, mass, midline shift, or extra-axial fluid collection. Moderately advanced cerebral atrophy is again noted with asymmetric enlargement of the right lateral ventricle, unchanged. Periventricular white matter hypodensities are unchanged and nonspecific but compatible with chronic small vessel ischemic disease, minimal for age. Vascular: Calcified atherosclerosis at the skull base. No hyperdense vessel. Skull: No acute  fracture or focal osseous lesion. Other: Resolved right parietal scalp hematoma. Small left supraorbital/frontal scalp hematoma, new. Essentially resolved right frontal scalp hematoma. CT MAXILLOFACIAL FINDINGS Osseous: Old nasal bone fracture. No acute fracture or mandibular dislocation. Moderate bilateral TMJ arthrosis. Orbits: Bilateral cataract extraction. Left scleral buckle. Sinuses: Small right maxillary sinus mucous retention cyst. Clear mastoid air cells. Soft tissues: Small left supraorbital hematoma. CT CERVICAL SPINE FINDINGS Alignment: Unchanged slight retrolisthesis of C2 on C3 and C4 on C5 and slight anterolisthesis of C7 on T1. Skull base and vertebrae: No acute fracture or destructive osseous lesion. Soft tissues and spinal canal: No prevertebral fluid or swelling. No visible canal hematoma. Disc levels: Moderate to severe disc space narrowing at C2-3, C4-5, C5-6, and C6-7. Moderate left greater than right multilevel facet arthrosis. Moderate to severe neural foraminal stenosis from  C3-4 to C6-7. No significant osseous spinal canal stenosis. Upper chest: Mild motion artifact through the lung apices with partially visualized mild streaky opacity in the posterior right upper lobe and superior segment of the right lower lobe. Other: Carotid artery atherosclerosis. IMPRESSION: 1. No evidence of acute intracranial abnormality. 2. Small left supraorbital hematoma. 3. No acute maxillofacial fracture. 4. No acute fracture or traumatic subluxation identified in the cervical spine. 5. Partially visualized mild opacity in the posterior right upper lung. Correlate for any acute pulmonary symptoms and consider further evaluation with chest radiographs. Electronically Signed   By: Logan Bores M.D.   On: 05/01/2018 14:44   Ct Head Wo Contrast  Result Date: 04/13/2018 CLINICAL DATA:  Unwitnessed fall, posterior head hematoma. At baseline. History of dementia, gait abnormality, hypertension and hyperlipidemia.  EXAM: CT HEAD WITHOUT CONTRAST CT CERVICAL SPINE WITHOUT CONTRAST TECHNIQUE: Multidetector CT imaging of the head and cervical spine was performed following the standard protocol without intravenous contrast. Multiplanar CT image reconstructions of the cervical spine were also generated. COMPARISON:  CT HEAD and cervical spine Apr 08, 2018 FINDINGS: CT HEAD FINDINGS BRAIN: No intraparenchymal hemorrhage, mass effect nor midline shift. Moderate to severe parenchymal brain volume loss. No hydrocephalus. Patchy supratentorial white matter hypodensities less than expected for patient's age, though non-specific are most compatible with chronic small vessel ischemic disease. No acute large vascular territory infarcts. No abnormal extra-axial fluid collections. Basal cisterns are patent. VASCULAR: Moderate calcific atherosclerosis of the carotid siphons. SKULL: No skull fracture. Moderate RIGHT greater than LEFT temporomandibular osteoarthrosis. Old RIGHT nasal bone fracture. Small residual RIGHT frontal scalp hematoma. Moderate RIGHT parietal scalp hematoma without subcutaneous gas or radiopaque foreign bodies. SINUSES/ORBITS: The mastoid air-cells and included paranasal sinuses are well-aerated.The included ocular globes and orbital contents are non-suspicious. Status post bilateral ocular lens implants and LEFT scleral banding. OTHER: None. CT CERVICAL SPINE FINDINGS ALIGNMENT: Maintained lordosis. Stable minimal grade 1 C2-3 retrolisthesis and C7-T1 anterolisthesis. SKULL BASE AND VERTEBRAE: Cervical vertebral bodies and posterior elements are intact. Severe C4-5 disc height loss, moderate to severe C2-3, C5-6 and C6-7 with endplate sclerosis and marginal spurring compatible with degenerative discs, unchanged. No destructive bony lesions. SOFT TISSUES AND SPINAL CANAL: Nonacute. Moderate calcific atherosclerosis carotid bifurcations. DISC LEVELS: No significant osseous canal stenosis. Severe bilateral C3-4, RIGHT C4-5,  bilateral C5-6 and LEFT C6-7 neural foraminal narrowing. UPPER CHEST: Lung apices are clear. OTHER: None. IMPRESSION: CT HEAD: 1. No acute intracranial process. New RIGHT scalp hematoma, subacute RIGHT frontal scalp hematoma. No skull fracture. 2. Stable examination including moderate to severe parenchymal brain volume loss. CT CERVICAL SPINE: 1. No acute fracture or malalignment. 2. Stable minimal grade 1 C2-3 and C7-T1 spondylolisthesis. Electronically Signed   By: Elon Alas M.D.   On: 04/13/2018 17:52   Ct Head Wo Contrast  Result Date: 04/08/2018 CLINICAL DATA:  Facial injury after unwitnessed fall. EXAM: CT HEAD WITHOUT CONTRAST CT MAXILLOFACIAL WITHOUT CONTRAST CT CERVICAL SPINE WITHOUT CONTRAST TECHNIQUE: Multidetector CT imaging of the head, cervical spine, and maxillofacial structures were performed using the standard protocol without intravenous contrast. Multiplanar CT image reconstructions of the cervical spine and maxillofacial structures were also generated. COMPARISON:  CT scan of March 07, 2018. FINDINGS: CT HEAD FINDINGS Brain: Mild diffuse cortical atrophy is noted. Mild chronic ischemic white matter disease is noted. No mass effect or midline shift is noted. Ventricular size is within normal limits. There is no evidence of mass lesion, hemorrhage or acute infarction. Vascular: No hyperdense  vessel or unexpected calcification. Skull: Normal. Negative for fracture or focal lesion. Other: Right frontal scalp hematoma is noted. CT MAXILLOFACIAL FINDINGS Osseous: Moderately displaced nasal bone fracture is noted. The mandible is unremarkable. Orbits: Negative. No traumatic or inflammatory finding. Sinuses: Mucous retention cyst is noted in right maxillary sinus. Remaining paranasal sinuses are unremarkable. Soft tissues: Negative. CT CERVICAL SPINE FINDINGS Alignment: Normal. Skull base and vertebrae: No acute fracture. No primary bone lesion or focal pathologic process. Soft tissues and  spinal canal: No prevertebral fluid or swelling. No visible canal hematoma. Disc levels: Severe degenerative disc disease is noted at C2-3, C4-5 and C5-6. Moderate degenerative disc disease is seen at C6-7. Upper chest: Negative. Other: Degenerative changes are seen involving posterior facet joints bilaterally. IMPRESSION: Mild diffuse cortical atrophy. Mild chronic ischemic white matter disease. Right frontal scalp hematoma. No acute intracranial abnormality seen. Moderately displaced nasal bone fracture. Severe multilevel degenerative disc disease. No acute abnormality seen in the cervical spine. Electronically Signed   By: Marijo Conception, M.D.   On: 04/08/2018 08:04   Ct Cervical Spine Wo Contrast  Result Date: 05/01/2018 CLINICAL DATA:  Unwitnessed fall. Left eye bruising. History of dementia. Initial encounter. EXAM: CT HEAD WITHOUT CONTRAST CT MAXILLOFACIAL WITHOUT CONTRAST CT CERVICAL SPINE WITHOUT CONTRAST TECHNIQUE: Multidetector CT imaging of the head, cervical spine, and maxillofacial structures were performed using the standard protocol without intravenous contrast. Multiplanar CT image reconstructions of the cervical spine and maxillofacial structures were also generated. COMPARISON:  Head and cervical spine CT 04/13/2018. Maxillofacial CT 04/08/2018. FINDINGS: CT HEAD FINDINGS Brain: There is no evidence of acute infarct, intracranial hemorrhage, mass, midline shift, or extra-axial fluid collection. Moderately advanced cerebral atrophy is again noted with asymmetric enlargement of the right lateral ventricle, unchanged. Periventricular white matter hypodensities are unchanged and nonspecific but compatible with chronic small vessel ischemic disease, minimal for age. Vascular: Calcified atherosclerosis at the skull base. No hyperdense vessel. Skull: No acute fracture or focal osseous lesion. Other: Resolved right parietal scalp hematoma. Small left supraorbital/frontal scalp hematoma, new.  Essentially resolved right frontal scalp hematoma. CT MAXILLOFACIAL FINDINGS Osseous: Old nasal bone fracture. No acute fracture or mandibular dislocation. Moderate bilateral TMJ arthrosis. Orbits: Bilateral cataract extraction. Left scleral buckle. Sinuses: Small right maxillary sinus mucous retention cyst. Clear mastoid air cells. Soft tissues: Small left supraorbital hematoma. CT CERVICAL SPINE FINDINGS Alignment: Unchanged slight retrolisthesis of C2 on C3 and C4 on C5 and slight anterolisthesis of C7 on T1. Skull base and vertebrae: No acute fracture or destructive osseous lesion. Soft tissues and spinal canal: No prevertebral fluid or swelling. No visible canal hematoma. Disc levels: Moderate to severe disc space narrowing at C2-3, C4-5, C5-6, and C6-7. Moderate left greater than right multilevel facet arthrosis. Moderate to severe neural foraminal stenosis from C3-4 to C6-7. No significant osseous spinal canal stenosis. Upper chest: Mild motion artifact through the lung apices with partially visualized mild streaky opacity in the posterior right upper lobe and superior segment of the right lower lobe. Other: Carotid artery atherosclerosis. IMPRESSION: 1. No evidence of acute intracranial abnormality. 2. Small left supraorbital hematoma. 3. No acute maxillofacial fracture. 4. No acute fracture or traumatic subluxation identified in the cervical spine. 5. Partially visualized mild opacity in the posterior right upper lung. Correlate for any acute pulmonary symptoms and consider further evaluation with chest radiographs. Electronically Signed   By: Logan Bores M.D.   On: 05/01/2018 14:44   Ct Cervical Spine Wo Contrast  Result Date: 04/13/2018  CLINICAL DATA:  Unwitnessed fall, posterior head hematoma. At baseline. History of dementia, gait abnormality, hypertension and hyperlipidemia. EXAM: CT HEAD WITHOUT CONTRAST CT CERVICAL SPINE WITHOUT CONTRAST TECHNIQUE: Multidetector CT imaging of the head and  cervical spine was performed following the standard protocol without intravenous contrast. Multiplanar CT image reconstructions of the cervical spine were also generated. COMPARISON:  CT HEAD and cervical spine Apr 08, 2018 FINDINGS: CT HEAD FINDINGS BRAIN: No intraparenchymal hemorrhage, mass effect nor midline shift. Moderate to severe parenchymal brain volume loss. No hydrocephalus. Patchy supratentorial white matter hypodensities less than expected for patient's age, though non-specific are most compatible with chronic small vessel ischemic disease. No acute large vascular territory infarcts. No abnormal extra-axial fluid collections. Basal cisterns are patent. VASCULAR: Moderate calcific atherosclerosis of the carotid siphons. SKULL: No skull fracture. Moderate RIGHT greater than LEFT temporomandibular osteoarthrosis. Old RIGHT nasal bone fracture. Small residual RIGHT frontal scalp hematoma. Moderate RIGHT parietal scalp hematoma without subcutaneous gas or radiopaque foreign bodies. SINUSES/ORBITS: The mastoid air-cells and included paranasal sinuses are well-aerated.The included ocular globes and orbital contents are non-suspicious. Status post bilateral ocular lens implants and LEFT scleral banding. OTHER: None. CT CERVICAL SPINE FINDINGS ALIGNMENT: Maintained lordosis. Stable minimal grade 1 C2-3 retrolisthesis and C7-T1 anterolisthesis. SKULL BASE AND VERTEBRAE: Cervical vertebral bodies and posterior elements are intact. Severe C4-5 disc height loss, moderate to severe C2-3, C5-6 and C6-7 with endplate sclerosis and marginal spurring compatible with degenerative discs, unchanged. No destructive bony lesions. SOFT TISSUES AND SPINAL CANAL: Nonacute. Moderate calcific atherosclerosis carotid bifurcations. DISC LEVELS: No significant osseous canal stenosis. Severe bilateral C3-4, RIGHT C4-5, bilateral C5-6 and LEFT C6-7 neural foraminal narrowing. UPPER CHEST: Lung apices are clear. OTHER: None. IMPRESSION:  CT HEAD: 1. No acute intracranial process. New RIGHT scalp hematoma, subacute RIGHT frontal scalp hematoma. No skull fracture. 2. Stable examination including moderate to severe parenchymal brain volume loss. CT CERVICAL SPINE: 1. No acute fracture or malalignment. 2. Stable minimal grade 1 C2-3 and C7-T1 spondylolisthesis. Electronically Signed   By: Elon Alas M.D.   On: 04/13/2018 17:52   Ct Cervical Spine Wo Contrast  Result Date: 04/08/2018 CLINICAL DATA:  Facial injury after unwitnessed fall. EXAM: CT HEAD WITHOUT CONTRAST CT MAXILLOFACIAL WITHOUT CONTRAST CT CERVICAL SPINE WITHOUT CONTRAST TECHNIQUE: Multidetector CT imaging of the head, cervical spine, and maxillofacial structures were performed using the standard protocol without intravenous contrast. Multiplanar CT image reconstructions of the cervical spine and maxillofacial structures were also generated. COMPARISON:  CT scan of March 07, 2018. FINDINGS: CT HEAD FINDINGS Brain: Mild diffuse cortical atrophy is noted. Mild chronic ischemic white matter disease is noted. No mass effect or midline shift is noted. Ventricular size is within normal limits. There is no evidence of mass lesion, hemorrhage or acute infarction. Vascular: No hyperdense vessel or unexpected calcification. Skull: Normal. Negative for fracture or focal lesion. Other: Right frontal scalp hematoma is noted. CT MAXILLOFACIAL FINDINGS Osseous: Moderately displaced nasal bone fracture is noted. The mandible is unremarkable. Orbits: Negative. No traumatic or inflammatory finding. Sinuses: Mucous retention cyst is noted in right maxillary sinus. Remaining paranasal sinuses are unremarkable. Soft tissues: Negative. CT CERVICAL SPINE FINDINGS Alignment: Normal. Skull base and vertebrae: No acute fracture. No primary bone lesion or focal pathologic process. Soft tissues and spinal canal: No prevertebral fluid or swelling. No visible canal hematoma. Disc levels: Severe degenerative  disc disease is noted at C2-3, C4-5 and C5-6. Moderate degenerative disc disease is seen at C6-7. Upper chest: Negative.  Other: Degenerative changes are seen involving posterior facet joints bilaterally. IMPRESSION: Mild diffuse cortical atrophy. Mild chronic ischemic white matter disease. Right frontal scalp hematoma. No acute intracranial abnormality seen. Moderately displaced nasal bone fracture. Severe multilevel degenerative disc disease. No acute abnormality seen in the cervical spine. Electronically Signed   By: Marijo Conception, M.D.   On: 04/08/2018 08:04   Dg Chest Portable 1 View  Result Date: 05/01/2018 CLINICAL DATA:  Fall. EXAM: PORTABLE CHEST 1 VIEW COMPARISON:  Multiple chest x-rays since January 04, 2017 FINDINGS: Focal infiltrate in the lateral right upper lung, not clearly seen previously. No other evidence of nodule, mass, or infiltrate. No pneumothorax. The cardiomediastinal silhouette is unremarkable. IMPRESSION: 1. Mild focal infiltrate in the lateral right upper lung. Recommend short-term follow-up to ensure resolution. Electronically Signed   By: Dorise Bullion III M.D   On: 05/01/2018 15:32   Dg Chest Portable 1 View  Result Date: 04/08/2018 CLINICAL DATA:  Fall, possible syncope. EXAM: PORTABLE CHEST 1 VIEW COMPARISON:  Radiographs of March 07, 2018 and January 04, 2017. FINDINGS: The heart size and mediastinal contours are within normal limits. No pneumothorax or pleural effusion is noted. Right lung is clear. Stable calcified granuloma seen in left lower lobe. The visualized skeletal structures are unremarkable. IMPRESSION: No acute cardiopulmonary abnormality seen. Electronically Signed   By: Marijo Conception, M.D.   On: 04/08/2018 07:34   Dg Humerus Left  Result Date: 05/01/2018 CLINICAL DATA:  Bruise after fall. EXAM: LEFT HUMERUS - 2+ VIEW COMPARISON:  None. FINDINGS: There is no evidence of fracture or other focal bone lesions. Soft tissues are unremarkable. IMPRESSION:  Negative. Electronically Signed   By: Dorise Bullion III M.D   On: 05/01/2018 14:56   Ct Maxillofacial Wo Contrast  Result Date: 05/01/2018 CLINICAL DATA:  Unwitnessed fall. Left eye bruising. History of dementia. Initial encounter. EXAM: CT HEAD WITHOUT CONTRAST CT MAXILLOFACIAL WITHOUT CONTRAST CT CERVICAL SPINE WITHOUT CONTRAST TECHNIQUE: Multidetector CT imaging of the head, cervical spine, and maxillofacial structures were performed using the standard protocol without intravenous contrast. Multiplanar CT image reconstructions of the cervical spine and maxillofacial structures were also generated. COMPARISON:  Head and cervical spine CT 04/13/2018. Maxillofacial CT 04/08/2018. FINDINGS: CT HEAD FINDINGS Brain: There is no evidence of acute infarct, intracranial hemorrhage, mass, midline shift, or extra-axial fluid collection. Moderately advanced cerebral atrophy is again noted with asymmetric enlargement of the right lateral ventricle, unchanged. Periventricular white matter hypodensities are unchanged and nonspecific but compatible with chronic small vessel ischemic disease, minimal for age. Vascular: Calcified atherosclerosis at the skull base. No hyperdense vessel. Skull: No acute fracture or focal osseous lesion. Other: Resolved right parietal scalp hematoma. Small left supraorbital/frontal scalp hematoma, new. Essentially resolved right frontal scalp hematoma. CT MAXILLOFACIAL FINDINGS Osseous: Old nasal bone fracture. No acute fracture or mandibular dislocation. Moderate bilateral TMJ arthrosis. Orbits: Bilateral cataract extraction. Left scleral buckle. Sinuses: Small right maxillary sinus mucous retention cyst. Clear mastoid air cells. Soft tissues: Small left supraorbital hematoma. CT CERVICAL SPINE FINDINGS Alignment: Unchanged slight retrolisthesis of C2 on C3 and C4 on C5 and slight anterolisthesis of C7 on T1. Skull base and vertebrae: No acute fracture or destructive osseous lesion. Soft  tissues and spinal canal: No prevertebral fluid or swelling. No visible canal hematoma. Disc levels: Moderate to severe disc space narrowing at C2-3, C4-5, C5-6, and C6-7. Moderate left greater than right multilevel facet arthrosis. Moderate to severe neural foraminal stenosis from C3-4 to C6-7. No  significant osseous spinal canal stenosis. Upper chest: Mild motion artifact through the lung apices with partially visualized mild streaky opacity in the posterior right upper lobe and superior segment of the right lower lobe. Other: Carotid artery atherosclerosis. IMPRESSION: 1. No evidence of acute intracranial abnormality. 2. Small left supraorbital hematoma. 3. No acute maxillofacial fracture. 4. No acute fracture or traumatic subluxation identified in the cervical spine. 5. Partially visualized mild opacity in the posterior right upper lung. Correlate for any acute pulmonary symptoms and consider further evaluation with chest radiographs. Electronically Signed   By: Logan Bores M.D.   On: 05/01/2018 14:44   Ct Maxillofacial Wo Contrast  Result Date: 04/08/2018 CLINICAL DATA:  Facial injury after unwitnessed fall. EXAM: CT HEAD WITHOUT CONTRAST CT MAXILLOFACIAL WITHOUT CONTRAST CT CERVICAL SPINE WITHOUT CONTRAST TECHNIQUE: Multidetector CT imaging of the head, cervical spine, and maxillofacial structures were performed using the standard protocol without intravenous contrast. Multiplanar CT image reconstructions of the cervical spine and maxillofacial structures were also generated. COMPARISON:  CT scan of March 07, 2018. FINDINGS: CT HEAD FINDINGS Brain: Mild diffuse cortical atrophy is noted. Mild chronic ischemic white matter disease is noted. No mass effect or midline shift is noted. Ventricular size is within normal limits. There is no evidence of mass lesion, hemorrhage or acute infarction. Vascular: No hyperdense vessel or unexpected calcification. Skull: Normal. Negative for fracture or focal lesion.  Other: Right frontal scalp hematoma is noted. CT MAXILLOFACIAL FINDINGS Osseous: Moderately displaced nasal bone fracture is noted. The mandible is unremarkable. Orbits: Negative. No traumatic or inflammatory finding. Sinuses: Mucous retention cyst is noted in right maxillary sinus. Remaining paranasal sinuses are unremarkable. Soft tissues: Negative. CT CERVICAL SPINE FINDINGS Alignment: Normal. Skull base and vertebrae: No acute fracture. No primary bone lesion or focal pathologic process. Soft tissues and spinal canal: No prevertebral fluid or swelling. No visible canal hematoma. Disc levels: Severe degenerative disc disease is noted at C2-3, C4-5 and C5-6. Moderate degenerative disc disease is seen at C6-7. Upper chest: Negative. Other: Degenerative changes are seen involving posterior facet joints bilaterally. IMPRESSION: Mild diffuse cortical atrophy. Mild chronic ischemic white matter disease. Right frontal scalp hematoma. No acute intracranial abnormality seen. Moderately displaced nasal bone fracture. Severe multilevel degenerative disc disease. No acute abnormality seen in the cervical spine. Electronically Signed   By: Marijo Conception, M.D.   On: 04/08/2018 08:04    Assessment:   ELAIN WIXON is a 77 y.o. female with advanced dementia and PD admitted with frequent falls, on treatment for an E coli outpt UTI and with a vesicular eruption over R buttocks in a crop of 4. These have recurred at the same location for many years per her daughter. 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  I have sent PCRs for both.  Recommendations Dc isolation Cont valtrex for 3 days at HSV dosing not shingles dosing. For her UTI - her last cx a few days ago was pan sensitive E coli. She is on treatment for CAP but unclear if she has PNA with no cough reported, nml wbc and no fevers. If plan to treat for CAP would limit to 5 days total. Thank you very much for allowing me to  participate in the care of this patient. Please call with questions.   Cheral Marker. Ola Spurr, MD

## 2018-05-02 NOTE — Telephone Encounter (Signed)
I called the daughter and I left a message.  I am aware of the hospitalization, the patient had been on Bactrim but then was switched off to another antibiotic as this was not completely effective.  The patient could potentially benefit from some rehab following the hospitalization.  Urinary tract infection could be the source of the generalized weakness and increased tendency for falls.

## 2018-05-02 NOTE — Evaluation (Signed)
Clinical/Bedside Swallow Evaluation Patient Details  Name: Theresa Sloan MRN: [id number] Date of Birth: 10/26/1941  Today's Date: 05/02/2018 Time: SLP Start Time (ACUTE ONLY): 5 SLP Stop Time (ACUTE ONLY): 1330 SLP Time Calculation (min) (ACUTE ONLY): 60 min  Past Medical History:  Past Medical History:  Diagnosis Date  . Alzheimer disease   . Anxiety   . Dementia   . 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 Gait difficulty 03/05/2015  . GERD (gastroesophageal reflux disease)   . Hyperlipidemia   . Hypertension   . Memory difficulty 08/07/2014  . TMJ (dislocation of temporomandibular joint)    Past Surgical History:  Past Surgical History:  Procedure Laterality Date  . CESAREAN SECTION  1992  . RETINAL TEAR REPAIR CRYOTHERAPY     HPI:  Pt is a 77 y.o. female with a known history of advanced Alzheimer's dementia, Parkinson's disease, hypertension, hyperlipidemia, anxiety who presents to the hospital due to recurrent falls and generalized weakness.  Patient herself is a very poor historian therefore most of the history obtained from the daughter at bedside.  As per the daughter patient has had 5 falls in the past month.  Today she was found down her assisted living this morning.  She was brought to the ER for further evaluation.  On routine blood work patient was noted to have a urinary tract infection.  Patient was suspected to have a UTI at her assisted living and started on oral Bactrim just a few days ago and has 2 more days of therapy left.  Despite being on antibiotics she feels increasingly weak, she is not eating well and has had recurrent falls and therefore brought to the ER for further evaluation. Pt is awake but fatigued; she answered a few basic questions re: self and likes re: foods. Pt smiled often; required verbal cues for follow through w/ all tasks.    Assessment / Plan / Recommendation Clinical Impression  Pt appeared to present w/ adequate  oropharyngeal phase swallow function for the trials given and is at reduced risk for aspiration when following general aspiration precautions. Pt adequately tolerated po trials of Puree and Thin liquids via cup/straw w/ no immediate, overt s/s of aspiration noted; no decline in vocal quality or respiratory status during/post trials given. No overt coughing noted; Audible swallows were noted though. Educated Dtr on monitoring for larger sips and lessen such in order to reduce risk for aspiration. Pt's oral phase was [facility] for the bolus management and oral clearing w/ trial consistencies given. Pt was NOT given solids d/t her current presentation of engagement w/ oral tasks; pt's eyes were often closed (appeared fatigued) and she needed cues to reattend. Pt appears weak as well. Recommend a dysphagia level 1(PUREE) w/ Thin liquids w/ general aspiration precautions and positioning upright for any oral intake. Use of condiments to flavor and moisten foods. Pt requires feeding support; must be fully awake when taking po's. Recommend Pills be CRUSHED in Puree - Ice Cream would work best d/t its liquid base - may try Whole in puree if able. When pt's overall medical and Cognitive status' improve, consideration for upgrade in food consistency of diet can be considered. Also consider pt's safety/ability w/ self-feeding at meals. Recommend Dietician f/u for nutritional support.  SLP Visit Diagnosis: Dysphagia, oral phase (R13.11);Dysphagia, unspecified (R13.10)    Aspiration Risk  (reduced following general aspiration precautions)    Diet Recommendation  Dysphagia level 1 (PUREE) w/ Thin liquids; condiments at meals.  General aspiration precautions; feeding support at meals.   Medication Administration: Crushed with puree(as needed; Whole if able)    Other  Recommendations Recommended Consults: (Dietician f/u) Oral Care Recommendations: Oral care BID;Staff/trained caregiver to provide oral care Other Recommendations:  (n/a)   Follow up Recommendations (TBD)      Frequency and Duration min 3x week  2 weeks       Prognosis Prognosis for Safe Diet Advancement: Fair(-Good) Barriers to Reach Goals: Cognitive deficits;Time post onset(Parkinson's Dis.)      Swallow Study   General Date of Onset: 05/01/18 HPI: Pt is a 77 y.o. female with a known history of advanced Alzheimer's dementia, Parkinson's disease, hypertension, hyperlipidemia, anxiety who presents to the hospital due to recurrent falls and generalized weakness.  Patient herself is a very poor historian therefore most of the history obtained from the daughter at bedside.  As per the daughter patient has had 5 falls in the past month.  Today she was found down her assisted living this morning.  She was brought to the ER for further evaluation.  On routine blood work patient was noted to have a urinary tract infection.  Patient was suspected to have a UTI at her assisted living and started on oral Bactrim just a few days ago and has 2 more days of therapy left.  Despite being on antibiotics she feels increasingly weak, she is not eating well and has had recurrent falls and therefore brought to the ER for further evaluation. Pt is awake but fatigued; she answered a few basic questions re: self and likes re: foods. Pt smiled often; required verbal cues for follow through w/ all tasks.  Type of Study: Bedside Swallow Evaluation Previous Swallow Assessment: none Diet Prior to this Study: Regular;Thin liquids Temperature Spikes Noted: No(wbc 8.6) Respiratory Status: Room air History of Recent Intubation: No Behavior/Cognition: Alert;Cooperative;Pleasant mood;Confused;Lethargic/Drowsy;Distractible;Requires cueing(baseline Dementia; Parkinson's Dis.) Oral Cavity Assessment: Dry Oral Care Completed by SLP: Recent completion by staff Oral Cavity - Dentition: Adequate natural dentition Vision: Functional for self-feeding Self-Feeding Abilities: Able to feed  self;Needs assist;Needs set up;Total assist Patient Positioning: Upright in bed Baseline Vocal Quality: Low vocal intensity Volitional Cough: (Fair) Volitional Swallow: Able to elicit(w/ cues)    Oral/Motor/Sensory Function Overall Oral Motor/Sensory Function: Within functional limits(and w/ bolus management/clearing)   Ice Chips Ice chips: Within functional limits Presentation: Spoon(fed; 2 trials)   Thin Liquid Thin Liquid: Within functional limits Presentation: Cup;Self Fed;Straw Other Comments: audible swallows noted    Nectar Thick Nectar Thick Liquid: Not tested   Honey Thick Honey Thick Liquid: Not tested   Puree Puree: Within functional limits Presentation: Spoon(fed; 10 trials)   Solid   GO   Solid: Not tested Other Comments: not assessed d/t Cognitive status; fatigue         Orinda Kenner, MS, CCC-SLP Watson,Katherine 05/02/2018,4:01 PM

## 2018-05-02 NOTE — NC FL2 (Signed)
[place] LEVEL OF CARE SCREENING TOOL     IDENTIFICATION  Patient Name: Theresa Sloan Birthdate: 08/16/1941 Sex: female Admission Date (Current Location): 05/01/2018  [facility] and [place] Number:  Engineering geologist and Address:  [facility], [address] [place], [place] [address]      Provider Number: [id number]  Attending Physician Name and Address:  Nicholes Mango, MD  Relative Name and Phone Number:       Current Level of Care: Hospital Recommended Level of Care: [place] Prior Approval Number:    Date Approved/Denied:   PASRR Number:    Discharge Plan: SNF    Current Diagnoses: Patient Active Problem List   Diagnosis Date Noted  . Malnutrition of moderate degree 05/02/2018  . UTI (urinary tract infection) 05/01/2018  . Essential tremor 02/03/2016  . Subacute confusional state 02/03/2016  . Gait difficulty 03/05/2015  . Dementia   . Memory difficulty 08/07/2014  . 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 GERD (gastroesophageal reflux disease) 12/21/2013  . Insomnia 12/21/2013  . 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 Dry eyes 07/07/2012  . Epiretinal membrane (ERM) of left eye 07/07/2012  . 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 Pseudophakia of both eyes 07/07/2012    Orientation RESPIRATION BLADDER Height & Weight     Self  Normal Incontinent Weight: 112 lb 4.8 oz (50.9 kg) Height:  5\' 2"  (157.5 cm)  BEHAVIORAL SYMPTOMS/MOOD NEUROLOGICAL BOWEL NUTRITION STATUS  (was in memory care but cannot wander at this time) (none) Incontinent Diet(dysphagia)  AMBULATORY STATUS COMMUNICATION OF NEEDS Skin   Extensive Assist Verbally Bruising                       Personal Care Assistance Level of Assistance  Bathing, Feeding, Dressing Bathing Assistance: Maximum assistance Feeding assistance: Maximum assistance Dressing  Assistance: Maximum assistance     Functional Limitations Info  Sight, Hearing Sight Info: Impaired Hearing Info: Impaired      SPECIAL CARE FACTORS FREQUENCY  PT (By licensed PT)                    Contractures Contractures Info: Not present    Additional Factors Info                  Current Medications (05/02/2018):  This is the current hospital active medication list Current Facility-Administered Medications  Medication Dose Route Frequency Provider Last Rate Last Dose  . 0.9 %  sodium chloride infusion   Intravenous Continuous Gouru, Aruna, MD 75 mL/hr at 05/02/18 1220    . acetaminophen (TYLENOL) tablet 650 mg  650 mg Oral Q6H PRN Henreitta Leber, MD   650 mg at 05/01/18 2119   Or  . acetaminophen (TYLENOL) suppository 650 mg  650 mg Rectal Q6H PRN Henreitta Leber, MD      . atorvastatin (LIPITOR) tablet 80 mg  80 mg Oral QPM Henreitta Leber, MD   80 mg at 05/01/18 2119  . azithromycin (ZITHROMAX) 500 mg in sodium chloride 0.9 % 250 mL IVPB  500 mg Intravenous Daily Nicholes Mango, MD   Stopped at 05/02/18 1454  . carbidopa-levodopa (SINEMET IR) 25-100 MG per tablet immediate release 1 tablet  1 tablet Oral TID Henreitta Leber, MD   1 tablet at 05/02/18 1111   And  . entacapone (COMTAN) tablet 200 mg  200 mg  Oral TID Henreitta Leber, MD   200 mg at 05/02/18 1110  . cefTRIAXone (ROCEPHIN) 1 g in sodium chloride 0.9 % 100 mL IVPB  1 g Intravenous Q24H Henreitta Leber, MD      . diphenhydrAMINE (BENADRYL) injection 25 mg  25 mg Intravenous Once Lance Coon, MD      . enoxaparin (LOVENOX) injection 40 mg  40 mg Subcutaneous Q24H Henreitta Leber, MD   40 mg at 05/01/18 2119  . escitalopram (LEXAPRO) tablet 20 mg  20 mg Oral Daily Henreitta Leber, MD   20 mg at 05/02/18 1113  . lamoTRIgine (LAMICTAL) tablet 50 mg  50 mg Oral BID Henreitta Leber, MD   50 mg at 05/02/18 1221  . memantine (NAMENDA) tablet 10 mg  10 mg Oral BID Henreitta Leber, MD   10 mg at  05/02/18 1109  . multivitamin with minerals tablet 1 tablet  1 tablet Oral Daily Gouru, Aruna, MD      . ondansetron (ZOFRAN) tablet 4 mg  4 mg Oral Q6H PRN Henreitta Leber, MD       Or  . ondansetron (ZOFRAN) injection 4 mg  4 mg Intravenous Q6H PRN Sainani, Belia Heman, MD      . pantoprazole (PROTONIX) EC tablet 40 mg  40 mg Oral Daily Henreitta Leber, MD   40 mg at 05/02/18 1109  . propranolol ER (INDERAL LA) 24 hr capsule 60 mg  60 mg Oral Daily Henreitta Leber, MD   60 mg at 05/02/18 1111  . rivastigmine (EXELON) 9.5 mg/24hr 9.5 mg  9.5 mg Transdermal Daily Henreitta Leber, MD   9.5 mg at 05/02/18 1112  . valACYclovir (VALTREX) tablet 500 mg  500 mg Oral BID Leonel Ramsay, MD         Discharge Medications: Please see discharge summary for a list of discharge medications.  Relevant Imaging Results:  Relevant Lab Results:   Additional Information ss: [id number]  Shela Leff, LCSW

## 2018-05-03 LAB — BASIC METABOLIC PANEL
ANION GAP: 8 (ref 5–15)
BUN: 9 mg/dL (ref 6–20)
CHLORIDE: 109 mmol/L (ref 101–111)
CO2: 21 mmol/L — AB (ref 22–32)
Calcium: 8.3 mg/dL — ABNORMAL LOW (ref 8.9–10.3)
Creatinine, Ser: 0.75 mg/dL (ref 0.44–1.00)
GFR calc Af Amer: >60 mL/min (ref >60)
GFR calc non Af Amer: >60 mL/min (ref >60)
Glucose, Bld: 87 mg/dL (ref 65–99)
Potassium: 3.4 mmol/L — ABNORMAL LOW (ref 3.5–5.1)
Sodium: 138 mmol/L (ref 135–145)

## 2018-05-03 LAB — URINE CULTURE: Culture: NO GROWTH

## 2018-05-03 LAB — CBC
HEMATOCRIT: 36.1 % (ref 35.0–47.0)
HEMOGLOBIN: 12.3 g/dL (ref 12.0–16.0)
MCH: 34.4 pg — ABNORMAL HIGH (ref 26.0–34.0)
MCHC: 34.2 g/dL (ref 32.0–36.0)
MCV: 100.8 fL — ABNORMAL HIGH (ref 80.0–100.0)
Platelets: 195 10*3/uL (ref 150–440)
RBC: 3.58 MIL/uL — ABNORMAL LOW (ref 3.80–5.20)
RDW: 14.1 % (ref 11.5–14.5)
WBC: 9.9 10*3/uL (ref 3.6–11.0)

## 2018-05-03 LAB — HERPES SIMPLEX VIRUS(HSV) DNA BY PCR
HSV 1 DNA: NEGATIVE
HSV 2 DNA: POSITIVE — AB

## 2018-05-03 MED ORDER — LIDOCAINE-EPINEPHRINE (PF) 1 %-1:200000 IJ SOLN
INTRAMUSCULAR | Status: AC
  Filled 2018-05-03: qty 30

## 2018-05-03 MED ORDER — SODIUM CHLORIDE 0.9 % IV SOLN
INTRAVENOUS | Status: AC
  Administered 2018-05-03 – 2018-05-04 (×3): via INTRAVENOUS

## 2018-05-03 MED ORDER — POTASSIUM CHLORIDE CRYS ER 20 MEQ PO TBCR: 20.0000 meq | EXTENDED_RELEASE_TABLET | Freq: Once | ORAL | Status: DC

## 2018-05-03 NOTE — Progress Notes (Addendum)
SLP Cancellation Note  Patient Details Name: EMELIN DASCENZO MRN: [id number] DOB: Apr 10, 1941   Cancelled treatment:       Reason Eval/Treat Not Completed: Patient not medically ready(chart reviewed; NSG consulted re: pt's status today). Per NSG report, pt is taking few bites/sips of po's w/ NSG and w/ Dtr - she seems to be able to take a few more bites w/ her Dtr. Pt is demonstrating signs of Cognitive confusion w/ po trials per NSG description (teeth clenching, not opening mouth for po's, not swallowing po's - oral holding). Recommended NSG hold any po's if pt's is not Cognitively aware of task of swallowing d/t the inherent increased risk for choking/aspirating on the po material. Pt does have a baseline of Parkinson's Dis. and Dementia per H/P and MD notes. Noted order for Palliative Care consult. Recommend giving po's only when fully alert and attentive to task of eating/drinking - monitor for oral clearing w/ any po's; general aspiration precautions. Recommend continue current dysphagia level 1 diet (puree) w/ thin liquids for ease of swallowing the foods; nutritional support by Dietitian svcs such as Ensure (a drink form may be easiest for pt d/t Cognitive status). ST services will f/u w/ pt's status next 2-3 days. NSG agreed.     Orinda Kenner, MS, CCC-SLP Watson,Katherine 05/03/2018, 3:24 PM

## 2018-05-03 NOTE — Progress Notes (Signed)
[facility] NAME: Theresa Sloan    MR#:  [id number]  DATE OF BIRTH:  1941-06-14  SUBJECTIVE:  CHIEF COMPLAINT: Patient is not eating or drinking much according to the nurses report taking 1 or 2 bites   Frequent falls.   REVIEW OF SYSTEMS:  Review of system unobtainable as the patient is demented and altered.   DRUG ALLERGIES:  No Known Allergies  VITALS:  Blood pressure 116/77, pulse 63, temperature 97.8 F (36.6 C), temperature source Oral, resp. rate 16, height 5\' 2"  (1.575 m), weight 50.9 kg (112 lb 4.8 oz), SpO2 98 %.  PHYSICAL EXAMINATION:  GENERAL:  77 y.o.-year-old patient lying in the bed with no acute distress.  EYES: Pupils equal, round, reactive to light and accommodation. No scleral icterus. Extraocular muscles intact.  Left eye with periorbital hematoma HEENT: Head atraumatic, normocephalic. Oropharynx and nasopharynx clear.  NECK:  Supple, no jugular venous distention. No thyroid enlargement, no tenderness.  LUNGS: Normal breath sounds bilaterally, no wheezing, rales,rhonchi or crepitation. No use of accessory muscles of respiration.  CARDIOVASCULAR: S1, S2 normal. No murmurs, rubs, or gallops.  ABDOMEN: Soft, nontender, nondistended. Bowel sounds present. No organomegaly or mass.  EXTREMITIES: No pedal edema, cyanosis, or clubbing.  NEUROLOGIC: Cranial nerves II through XII are intact. Muscle strength 5/5 in all extremities. Sensation intact. Gait not checked.  PSYCHIATRIC: The patient is alert and oriented x 3.  SKIN: No obvious rash, lesion, or ulcer.    LABORATORY PANEL:   CBC Recent Labs  Lab 05/03/18 0525  WBC 9.9  HGB 12.3  HCT 36.1  PLT 195   ------------------------------------------------------------------------------------------------------------------  Chemistries  Recent Labs  Lab 05/01/18 1505  05/03/18 0525  NA 134*   < > 138  K 3.8   < > 3.4*  CL 100*   < > 109  CO2 21*    < > 21*  GLUCOSE 122*   < > 87  BUN 17   < > 9  CREATININE 1.09*   < > 0.75  CALCIUM 9.0   < > 8.3*  AST 36  --   --   ALT 17  --   --   ALKPHOS 83  --   --   BILITOT 0.4  --   --    < > = values in this interval not displayed.   ------------------------------------------------------------------------------------------------------------------  Cardiac Enzymes Recent Labs  Lab 05/01/18 1505  TROPONINI <0.03   ------------------------------------------------------------------------------------------------------------------  RADIOLOGY:  Ct Head Wo Contrast  Result Date: 05/01/2018 CLINICAL DATA:  Unwitnessed fall. Left eye bruising. History of dementia. Initial encounter. EXAM: CT HEAD WITHOUT CONTRAST CT MAXILLOFACIAL WITHOUT CONTRAST CT CERVICAL SPINE WITHOUT CONTRAST TECHNIQUE: Multidetector CT imaging of the head, cervical spine, and maxillofacial structures were performed using the standard protocol without intravenous contrast. Multiplanar CT image reconstructions of the cervical spine and maxillofacial structures were also generated. COMPARISON:  Head and cervical spine CT 04/13/2018. Maxillofacial CT 04/08/2018. FINDINGS: CT HEAD FINDINGS Brain: There is no evidence of acute infarct, intracranial hemorrhage, mass, midline shift, or extra-axial fluid collection. Moderately advanced cerebral atrophy is again noted with asymmetric enlargement of the right lateral ventricle, unchanged. Periventricular white matter hypodensities are unchanged and nonspecific but compatible with chronic small vessel ischemic disease, minimal for age. Vascular: Calcified atherosclerosis at the skull base. No hyperdense vessel. Skull: No acute fracture or focal osseous lesion. Other: Resolved right parietal scalp hematoma. Small left supraorbital/frontal scalp hematoma, new. Essentially  resolved right frontal scalp hematoma. CT MAXILLOFACIAL FINDINGS Osseous: Old nasal bone fracture. No acute fracture or  mandibular dislocation. Moderate bilateral TMJ arthrosis. Orbits: Bilateral cataract extraction. Left scleral buckle. Sinuses: Small right maxillary sinus mucous retention cyst. Clear mastoid air cells. Soft tissues: Small left supraorbital hematoma. CT CERVICAL SPINE FINDINGS Alignment: Unchanged slight retrolisthesis of C2 on C3 and C4 on C5 and slight anterolisthesis of C7 on T1. Skull base and vertebrae: No acute fracture or destructive osseous lesion. Soft tissues and spinal canal: No prevertebral fluid or swelling. No visible canal hematoma. Disc levels: Moderate to severe disc space narrowing at C2-3, C4-5, C5-6, and C6-7. Moderate left greater than right multilevel facet arthrosis. Moderate to severe neural foraminal stenosis from C3-4 to C6-7. No significant osseous spinal canal stenosis. Upper chest: Mild motion artifact through the lung apices with partially visualized mild streaky opacity in the posterior right upper lobe and superior segment of the right lower lobe. Other: Carotid artery atherosclerosis. IMPRESSION: 1. No evidence of acute intracranial abnormality. 2. Small left supraorbital hematoma. 3. No acute maxillofacial fracture. 4. No acute fracture or traumatic subluxation identified in the cervical spine. 5. Partially visualized mild opacity in the posterior right upper lung. Correlate for any acute pulmonary symptoms and consider further evaluation with chest radiographs. Electronically Signed   By: Logan Bores M.D.   On: 05/01/2018 14:44   Ct Cervical Spine Wo Contrast  Result Date: 05/01/2018 CLINICAL DATA:  Unwitnessed fall. Left eye bruising. History of dementia. Initial encounter. EXAM: CT HEAD WITHOUT CONTRAST CT MAXILLOFACIAL WITHOUT CONTRAST CT CERVICAL SPINE WITHOUT CONTRAST TECHNIQUE: Multidetector CT imaging of the head, cervical spine, and maxillofacial structures were performed using the standard protocol without intravenous contrast. Multiplanar CT image reconstructions of  the cervical spine and maxillofacial structures were also generated. COMPARISON:  Head and cervical spine CT 04/13/2018. Maxillofacial CT 04/08/2018. FINDINGS: CT HEAD FINDINGS Brain: There is no evidence of acute infarct, intracranial hemorrhage, mass, midline shift, or extra-axial fluid collection. Moderately advanced cerebral atrophy is again noted with asymmetric enlargement of the right lateral ventricle, unchanged. Periventricular white matter hypodensities are unchanged and nonspecific but compatible with chronic small vessel ischemic disease, minimal for age. Vascular: Calcified atherosclerosis at the skull base. No hyperdense vessel. Skull: No acute fracture or focal osseous lesion. Other: Resolved right parietal scalp hematoma. Small left supraorbital/frontal scalp hematoma, new. Essentially resolved right frontal scalp hematoma. CT MAXILLOFACIAL FINDINGS Osseous: Old nasal bone fracture. No acute fracture or mandibular dislocation. Moderate bilateral TMJ arthrosis. Orbits: Bilateral cataract extraction. Left scleral buckle. Sinuses: Small right maxillary sinus mucous retention cyst. Clear mastoid air cells. Soft tissues: Small left supraorbital hematoma. CT CERVICAL SPINE FINDINGS Alignment: Unchanged slight retrolisthesis of C2 on C3 and C4 on C5 and slight anterolisthesis of C7 on T1. Skull base and vertebrae: No acute fracture or destructive osseous lesion. Soft tissues and spinal canal: No prevertebral fluid or swelling. No visible canal hematoma. Disc levels: Moderate to severe disc space narrowing at C2-3, C4-5, C5-6, and C6-7. Moderate left greater than right multilevel facet arthrosis. Moderate to severe neural foraminal stenosis from C3-4 to C6-7. No significant osseous spinal canal stenosis. Upper chest: Mild motion artifact through the lung apices with partially visualized mild streaky opacity in the posterior right upper lobe and superior segment of the right lower lobe. Other: Carotid artery  atherosclerosis. IMPRESSION: 1. No evidence of acute intracranial abnormality. 2. Small left supraorbital hematoma. 3. No acute maxillofacial fracture. 4. No acute fracture or traumatic  subluxation identified in the cervical spine. 5. Partially visualized mild opacity in the posterior right upper lung. Correlate for any acute pulmonary symptoms and consider further evaluation with chest radiographs. Electronically Signed   By: Logan Bores M.D.   On: 05/01/2018 14:44   Dg Chest Portable 1 View  Result Date: 05/01/2018 CLINICAL DATA:  Fall. EXAM: PORTABLE CHEST 1 VIEW COMPARISON:  Multiple chest x-rays since January 04, 2017 FINDINGS: Focal infiltrate in the lateral right upper lung, not clearly seen previously. No other evidence of nodule, mass, or infiltrate. No pneumothorax. The cardiomediastinal silhouette is unremarkable. IMPRESSION: 1. Mild focal infiltrate in the lateral right upper lung. Recommend short-term follow-up to ensure resolution. Electronically Signed   By: Dorise Bullion III M.D   On: 05/01/2018 15:32   Dg Humerus Left  Result Date: 05/01/2018 CLINICAL DATA:  Bruise after fall. EXAM: LEFT HUMERUS - 2+ VIEW COMPARISON:  None. FINDINGS: There is no evidence of fracture or other focal bone lesions. Soft tissues are unremarkable. IMPRESSION: Negative. Electronically Signed   By: Dorise Bullion III M.D   On: 05/01/2018 14:56   Ct Maxillofacial Wo Contrast  Result Date: 05/01/2018 CLINICAL DATA:  Unwitnessed fall. Left eye bruising. History of dementia. Initial encounter. EXAM: CT HEAD WITHOUT CONTRAST CT MAXILLOFACIAL WITHOUT CONTRAST CT CERVICAL SPINE WITHOUT CONTRAST TECHNIQUE: Multidetector CT imaging of the head, cervical spine, and maxillofacial structures were performed using the standard protocol without intravenous contrast. Multiplanar CT image reconstructions of the cervical spine and maxillofacial structures were also generated. COMPARISON:  Head and cervical spine CT 04/13/2018.  Maxillofacial CT 04/08/2018. FINDINGS: CT HEAD FINDINGS Brain: There is no evidence of acute infarct, intracranial hemorrhage, mass, midline shift, or extra-axial fluid collection. Moderately advanced cerebral atrophy is again noted with asymmetric enlargement of the right lateral ventricle, unchanged. Periventricular white matter hypodensities are unchanged and nonspecific but compatible with chronic small vessel ischemic disease, minimal for age. Vascular: Calcified atherosclerosis at the skull base. No hyperdense vessel. Skull: No acute fracture or focal osseous lesion. Other: Resolved right parietal scalp hematoma. Small left supraorbital/frontal scalp hematoma, new. Essentially resolved right frontal scalp hematoma. CT MAXILLOFACIAL FINDINGS Osseous: Old nasal bone fracture. No acute fracture or mandibular dislocation. Moderate bilateral TMJ arthrosis. Orbits: Bilateral cataract extraction. Left scleral buckle. Sinuses: Small right maxillary sinus mucous retention cyst. Clear mastoid air cells. Soft tissues: Small left supraorbital hematoma. CT CERVICAL SPINE FINDINGS Alignment: Unchanged slight retrolisthesis of C2 on C3 and C4 on C5 and slight anterolisthesis of C7 on T1. Skull base and vertebrae: No acute fracture or destructive osseous lesion. Soft tissues and spinal canal: No prevertebral fluid or swelling. No visible canal hematoma. Disc levels: Moderate to severe disc space narrowing at C2-3, C4-5, C5-6, and C6-7. Moderate left greater than right multilevel facet arthrosis. Moderate to severe neural foraminal stenosis from C3-4 to C6-7. No significant osseous spinal canal stenosis. Upper chest: Mild motion artifact through the lung apices with partially visualized mild streaky opacity in the posterior right upper lobe and superior segment of the right lower lobe. Other: Carotid artery atherosclerosis. IMPRESSION: 1. No evidence of acute intracranial abnormality. 2. Small left supraorbital hematoma. 3. No  acute maxillofacial fracture. 4. No acute fracture or traumatic subluxation identified in the cervical spine. 5. Partially visualized mild opacity in the posterior right upper lung. Correlate for any acute pulmonary symptoms and consider further evaluation with chest radiographs. Electronically Signed   By: Logan Bores M.D.   On: 05/01/2018 14:44  EKG:   Orders placed or performed during the hospital encounter of 05/01/18  . ED EKG  . ED EKG    ASSESSMENT AND PLAN:    77 year old female with past medical history of Alzheimer's dementia, Parkinson's disease, hypertension, previous history of urinary tract infection, GERD, anxiety who presents to the hospital due to recurrent falls, generalized weakness and poor p.o. intake and noted to have a urinary tract infection.  1. Failure to thrive-dietary consult, speech therapy recommending dysphagia level 1 pured diet with thin liquids, meds crushed with pure We will do calorie count If no improvement will consider palliative care consult discussed with the daughter agrees    2.  Recurrent falls/generalized weakness-multifactorial in nature.  Related to underlying dementia with Parkinson's disease combined with 5 to thrive    We will get a physical therapy consult to assess her mobility.  3.    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  Dementia-patient is high risk for sundowning. -Continue Lamictal, Namenda, Exelon patch. -We will order some as needed Haldol for agitation.  5. History of Parkinson's disease-continue Sinemet.   6.  Chest x-ray with infiltrate- pneumonia continue IV Rocephin and added azithromycin, total antibiotic course will be 5 days ,breathing treatments as needed and sputum culture and sensitivity if we can get a sample-   7.GERD-continue Protonix.  8.  Urinary tract infection- ruled out with a negative urine culture  and sensitivity      All the records are reviewed and case discussed with Care Management/Social Workerr. VM left to pts daughter [name] CODE STATUS: fc   TOTAL TIME TAKING CARE OF THIS PATIENT: 37 minutes.   POSSIBLE D/C IN 2 DAYS, DEPENDING ON CLINICAL CONDITION.  Note: This dictation was prepared with Dragon dictation along with smaller phrase technology. Any transcriptional errors that result from this process are unintentional.   Nicholes Mango M.D on 05/03/2018 at 10:12 AM  Between 7am to 6pm - Pager - [phone] After 6pm go to [email or web address] - password [email or web address] [facility]  [facility]  Office  [phone]  CC: Primary care physician; [name], MD

## 2018-05-03 NOTE — Plan of Care (Signed)
Pt ate very little breakfast and started clenching her teeth whenever we attempted to give her food.

## 2018-05-03 NOTE — Clinical Social Work Note (Signed)
CSW received bed offers for patient and patient received a bed offer from [facility] which was daughter's preference. Patient's daughter is aware.CSW will facilitate discharge when time. Shela Leff MSW,LCSW [phone]

## 2018-05-04 ENCOUNTER — Inpatient Hospital Stay: Payer: Medicare Other

## 2018-05-04 DIAGNOSIS — Z515 Encounter for palliative care: Secondary | ICD-10-CM

## 2018-05-04 DIAGNOSIS — F0281 Dementia in other diseases classified elsewhere with behavioral disturbance: Secondary | ICD-10-CM

## 2018-05-04 DIAGNOSIS — Z7189 Other specified counseling: Secondary | ICD-10-CM

## 2018-05-04 DIAGNOSIS — W19XXXA Unspecified fall, initial encounter: Secondary | ICD-10-CM

## 2018-05-04 DIAGNOSIS — G20 Parkinson's disease: Secondary | ICD-10-CM

## 2018-05-04 DIAGNOSIS — G309 Alzheimer's disease, unspecified: Secondary | ICD-10-CM

## 2018-05-04 MED ORDER — POTASSIUM CHLORIDE 20 MEQ PO PACK
20.0000 meq | PACK | Freq: Once | ORAL | Status: AC
  Administered 2018-05-04: 20 meq via ORAL
  Filled 2018-05-04: qty 1

## 2018-05-04 MED ORDER — SODIUM CHLORIDE 0.9 % IV SOLN: INTRAVENOUS | Status: DC

## 2018-05-04 MED ORDER — MEGESTROL ACETATE 20 MG PO TABS
160.0000 mg | ORAL_TABLET | Freq: Three times a day (TID) | ORAL | Status: DC
  Administered 2018-05-04 – 2018-05-05 (×2): 160 mg via ORAL
  Filled 2018-05-04 (×5): qty 8

## 2018-05-04 NOTE — Progress Notes (Signed)
ID E note  HSV 2 PCR + Can finish the current valtrex treatment dosing.  Given frequency of recurrence in the same location over many years I would suggest prophylaxis to prevent recurrence with valtrex 500 mg once a day for prolonged period or indefinitely if needed.

## 2018-05-04 NOTE — Progress Notes (Signed)
Tele sitter discontinued today at 1030 am

## 2018-05-04 NOTE — Care Management Important Message (Signed)
Copy of signed IM left with patient in room.  

## 2018-05-04 NOTE — Progress Notes (Signed)
Physical Therapy Treatment Patient Details Name: LELLA MULLANY MRN: [id number] DOB: 29-Jul-1941 Today's Date: 05/04/2018    History of Present Illness Vallorie Niccoli  is a 77 y.o. female with a known history of advanced Alzheimer's Dementia, Parkinson's disease, hypertension, hyperlipidemia, anxiety who presents to the hospital due to recurrent falls and generalized weakness. History obtained from the daughter at bedside.  As per the daughter patient has had 5 falls in the past month.  She was found on the floor at her assisted living facility, [facility].  She was brought to the ER for further evaluation.  On routine blood work patient was noted to have a urinary tract infection.  Patient was suspected to have a UTI at her assisted living and started on oral Bactrim.  Despite being on antibiotics she feels increasingly weak, she is not eating well and has had recurrent falls and therefore brought to the ER for further evaluation. She is currently admitted for UTI and recurrent falls. Pt broke out with a rash yesterday and was placed on airborne precautions for suspected shingles.     PT Comments    Pt agreeable to PT; notes pain only at IV site. Pt progressing with bed mobility, requiring only Min A this session. Pt does move slowly with increased need for verbal and tactile cues. Mildly distractible throughout session requiring redirect to task at hand. Able to sit edge of bed with verbal cues for upright posture; no overt lateral lean. Performs LE exercises edge of bed. Transfer bed to chair with 1 person assist, no AD. Min a to stand; Min guard for steps to chair with Min A needed to continue steps for proper position in front of chair for safe sitting (pt does attempt early sit). Pt demonstrates R lateral lean in chair corrected with pillow. Continue PT to progress strength, endurance to improve functional mobility to return to PLOF.    Follow Up Recommendations  SNF     Equipment  Recommendations  None recommended by PT    Recommendations for Other Services       Precautions / Restrictions Precautions Precautions: Fall Restrictions Weight Bearing Restrictions: No    Mobility  Bed Mobility Overal bed mobility: Needs Assistance Bed Mobility: Supine to Sit     Supine to sit: Min assist     General bed mobility comments: Increased verbal/tactile cueing; trunk assist and scooting assist  Transfers Overall transfer level: Needs assistance Equipment used: 1 person hand held assist Transfers: Sit to/from Stand Sit to Stand: Min assist         General transfer comment: cues for sequence; slow to rise, maintains partial flexed trunk posture and soft knees, but no buckling.   Ambulation/Gait Ambulation/Gait assistance: Min assist;Min guard Ambulation Distance (Feet): 3 Feet Assistive device: 1 person hand held assist       General Gait Details: 1 person assist primarily Min guard, but Min A for direction and safety to get pt in front of chair before sitting; pt attempts early sit.    Stairs             Wheelchair Mobility    Modified Rankin (Stroke Patients Only)       Balance Overall balance assessment: Needs assistance Sitting-balance support: Bilateral upper extremity supported;Feet supported Sitting balance-Leahy Scale: Fair     Standing balance support: During functional activity;No upper extremity supported(Min guard from therapist) Standing balance-Leahy Scale: Fair  Cognition Arousal/Alertness: Awake/alert Behavior During Therapy: Flat affect;WFL for tasks assessed/performed Overall Cognitive Status: History of cognitive impairments - at baseline                                        Exercises General Exercises - Lower Extremity Ankle Circles/Pumps: AROM;Both;15 reps;Seated Long Arc Quad: AROM;Both;15 reps;Seated Hip Flexion/Marching: AROM;Both;15  reps;Seated Other Exercises Other Exercises: upright neutral sitting. Encouraged head up; trunk in neutral versus fwd flexed    General Comments        Pertinent Vitals/Pain Pain Assessment: Faces Faces Pain Scale: Hurts a little bit Pain Location: R arm; IV site(covered in gauze wrap; no swelling/leaking noted)    Home Living                      Prior Function            PT Goals (current goals can now be found in the care plan section) Progress towards PT goals: Progressing toward goals    Frequency    Min 2X/week      PT Plan Current plan remains appropriate    Co-evaluation              AM-PAC PT "6 Clicks" Daily Activity  Outcome Measure  Difficulty turning over in bed (including adjusting bedclothes, sheets and blankets)?: Unable Difficulty moving from lying on back to sitting on the side of the bed? : Unable Difficulty sitting down on and standing up from a chair with arms (e.g., wheelchair, bedside commode, etc,.)?: Unable Help needed moving to and from a bed to chair (including a wheelchair)?: A Little Help needed walking in hospital room?: A Lot Help needed climbing 3-5 steps with a railing? : Total 6 Click Score: 9    End of Session Equipment Utilized During Treatment: Gait belt Activity Tolerance: Patient tolerated treatment well Patient left: in chair;with call bell/phone within reach;with chair alarm set;Other (comment)(fall mats in place)   PT Visit Diagnosis: Unsteadiness on feet (R26.81);Repeated falls (R29.6);Muscle weakness (generalized) (M62.81);Difficulty in walking, not elsewhere classified (R26.2)     Time: 1020-1049 PT Time Calculation (min) (ACUTE ONLY): 29 min  Charges:  $Therapeutic Exercise: 8-22 mins $Therapeutic Activity: 8-22 mins                    G Codes:        Larae Grooms, PTA 05/04/2018, 11:01 AM

## 2018-05-04 NOTE — Consult Note (Signed)
Consultation Note Date: 05/04/2018   Patient Name: Theresa Sloan  DOB: [birth year]  MRN: [id number]  Age / Sex: 77 y.o., female  PCP: [name], MD Referring Physician: Nicholes Mango, MD  Reason for Consultation: Establishing goals of care  HPI/Patient Profile: 77 y.o. female  with past medical history of Alzheimer's dementia, Parkinson's, recurrent UTI's, frequent falls, recurrent HSV, HTN, HLD admitted on 05/01/2018 with fall and generalized weakness. Workup reveals recurrent UTI, and aspiration pnuemonia. Per SLP eval she is clenching teeth and eating and drinking very little. Palliative medicine consulted for [place].    Clinical Assessment and Goals of Care:  Reviewed chart and evaluated patient at bedside. Patient has had several admissions for falls recently. She has been living at [facility].  She was awake and alert. Noted bilateral old orbital facial ecchymoses. R hand arm somewhat contracted with tremors.  She is unable to tell me where she is or her children's name. Cannot tell me her favorite music or food. Holds her hands up in fists and tells me she is "ready for them".    Played music for patient and she calmed and repeated words she heard in the song.   She was able to talk about old memories of her daughter.  I called daughterMaudie Mercury. Introduced Palliative medicine. [name] unable to come in today for [employer]. Requests PMT call tomorrow for phone [place] around 1230.   Primary Decision Maker NEXT OF KIN- pt daughterMaudie Mercury    SUMMARY OF RECOMMENDATIONS -PMT will attempt to discuss [place] with patient's daughter 6/6     Code Status/Advance Care Planning:  Full code  Palliative Prophylaxis:   Delirium Protocol  Prognosis:    < 6 months d/t advancing Alzheimer's, Parkinson's, frequent admissions for falls, recurrent UTI's, now with aspiration pnuemonia  Discharge Planning:  To Be Determined  Primary Diagnoses: Present on Admission: . UTI (urinary tract infection)   I have reviewed the medical record, interviewed the patient and family, and examined the patient. The following aspects are pertinent.  Past Medical History:  Diagnosis Date  . Alzheimer disease   . Anxiety   . Dementia   . 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 Gait difficulty 03/05/2015  . GERD (gastroesophageal reflux disease)   . Hyperlipidemia   . Hypertension   . Memory difficulty 08/07/2014  . TMJ (dislocation of temporomandibular joint)    Social History   Socioeconomic History  . Marital status: Widowed    Spouse name: Not on file  . Number of children: 2  . Years of education: 84 th  . Highest education level: Not on file  Occupational History  . Occupation: retired  Scientific laboratory technician  . Financial resource strain: Not on file  . Food insecurity:    Worry: Not on file    Inability: Not on file  . Transportation needs:    Medical: Not on file    Non-medical: Not on file  Tobacco Use  . Smoking status: Never Smoker  .  Smokeless tobacco: Never Used  Substance and Sexual Activity  . Alcohol use: No  . Drug use: No  . Sexual activity: Not on file  Lifestyle  . Physical activity:    Days per week: Not on file    Minutes per session: Not on file  . Stress: Not on file  Relationships  . Social connections:    Talks on phone: Not on file    Gets together: Not on file    Attends religious service: Not on file    Active member of club or organization: Not on file    Attends meetings of clubs or organizations: Not on file    Relationship status: Not on file  Other Topics Concern  . Not on file  Social History Narrative   Living with daughter since July 2018   Patient is right handed.   Patient does not drink caffeine.   Family History  Problem Relation Age of Onset  . Hypertension Sister   . Tremor Sister   . Brain cancer Mother    Scheduled  Meds: . atorvastatin  80 mg Oral QPM  . carbidopa-levodopa  1 tablet Oral TID   And  . entacapone  200 mg Oral TID  . enoxaparin (LOVENOX) injection  40 mg Subcutaneous Q24H  . escitalopram  20 mg Oral Daily  . lamoTRIgine  50 mg Oral BID  . megestrol  160 mg Oral TID  . memantine  10 mg Oral BID  . multivitamin with minerals  1 tablet Oral Daily  . pantoprazole  40 mg Oral Daily  . propranolol ER  60 mg Oral Daily  . rivastigmine  9.5 mg Transdermal Daily  . valACYclovir  500 mg Oral BID   Continuous Infusions: . sodium chloride    . cefTRIAXone (ROCEPHIN)  IV Stopped (05/03/18 1915)   PRN Meds:.acetaminophen **OR** acetaminophen, ondansetron **OR** ondansetron (ZOFRAN) IV Medications Prior to Admission:  Prior to Admission medications   Medication Sig Start Date End Date Taking? Authorizing Provider  acetaminophen (TYLENOL 8 HOUR) 650 MG CR tablet Take 650 mg by mouth every 6 (six) hours as needed for pain.   Yes [provider]  acetaminophen (TYLENOL) 500 MG tablet Take 500 mg by mouth daily.   Yes [provider]  atorvastatin (LIPITOR) 80 MG tablet Take 80 mg by mouth every evening.    Yes [provider]  carbidopa-levodopa-entacapone (STALEVO 100) 25-100-200 MG tablet Take 1 tablet by mouth 3 (three) times daily. 04/20/18  Yes Kathrynn Ducking, MD  escitalopram (LEXAPRO) 20 MG tablet TAKE ONE (1) TABLET BY MOUTH EVERY DAY 05/21/17  Yes Kathrynn Ducking, MD  lamoTRIgine (LAMICTAL) 25 MG tablet Take 2 tablets (50 mg total) by mouth 2 (two) times daily. 04/20/18  Yes Kathrynn Ducking, MD  memantine (NAMENDA) 10 MG tablet Take 1 tablet (10 mg total) by mouth 2 (two) times daily. 09/02/17  Yes Ward Givens, NP  Multiple Vitamins-Minerals (PRESERVISION AREDS 2 PO) Take 1 capsule by mouth 2 (two) times daily.    Yes [provider]  omeprazole (PRILOSEC) 20 MG capsule Take 1 capsule (20 mg total) by mouth daily. 10/13/16  Yes [name],  MD  propranolol ER (INDERAL LA) 60 MG 24 hr capsule TAKE ONE (1) CAPSULE EACH DAY 08/09/17  Yes Kathrynn Ducking, MD  rivastigmine (EXELON) 9.5 mg/24hr APPLY 1 PATCH TO THE SKIN DAILY Patient taking differently: Place 9.5 mg onto the skin daily. Place in the center of  back so patient cannot remove. 09/02/17  Yes Ward Givens, NP  sulfamethoxazole-trimethoprim (BACTRIM DS,SEPTRA DS) 800-160 MG tablet Take 1 tablet by mouth 2 (two) times daily. 04/25/18  Yes [name], MD  UNABLE TO FIND Take 4 oz by mouth every evening. Med Name: Elmer Picker   Yes [provider]   No Known Allergies Review of Systems  Physical Exam  Constitutional:  frail  HENT:  Head: Normocephalic.  Healing bilateral orbital ecchymoses  Musculoskeletal:  +tremors  Neurological: She is alert.  Not oriented to place or situation  Skin: There is pallor.  Nursing note and vitals reviewed.   Vital Signs: BP (!) 118/59 (BP Location: Left Arm)   Pulse 64   Temp 97.7 F (36.5 C)   Resp 17   Ht 5\' 2"  (1.575 m)   Wt 50.9 kg (112 lb 4.8 oz)   SpO2 96%   BMI 20.54 kg/m  Pain Scale: 0-10   Pain Score: 0-No pain   SpO2: SpO2: 96 % O2 Device:SpO2: 96 % O2 Flow Rate: .   IO: Intake/output summary:   Intake/Output Summary (Last 24 hours) at 05/04/2018 1617 Last data filed at 05/04/2018 0727 Gross per 24 hour  Intake 1575 ml  Output 3 ml  Net 1572 ml    LBM: Last BM Date: 05/03/18 Baseline Weight: Weight: 50.9 kg (112 lb 4.8 oz) Most recent weight: Weight: 50.9 kg (112 lb 4.8 oz)     Palliative Assessment/Data: PPS: 20%     Thank you for this consult. Palliative medicine will continue to follow and assist as needed.   Time In: 1430 Time Out: 1530 Time Total: 60 mins Greater than 50%  of this time was spent counseling and coordinating care related to the above assessment and plan.  Signed by: Mariana Kaufman, AGNP-C Palliative Medicine    Please contact Palliative Medicine Team  phone at [phone] for questions and concerns.  For individual provider: See Shea Evans

## 2018-05-04 NOTE — Progress Notes (Signed)
[facility] [place] NAME: Theresa Sloan    MR#:  [id number]  DATE OF BIRTH:  1941-03-02  SUBJECTIVE:  CHIEF COMPLAINT: Patient is more awake and alert and eating better than yesterday , able to answer few questions  REVIEW OF SYSTEMS:  Review of system unobtainable as the patient is demented and altered.   DRUG ALLERGIES:  No Known Allergies  VITALS:  Blood pressure (!) 118/59, pulse 64, temperature 97.7 F (36.5 C), resp. rate 17, height 5\' 2"  (1.575 m), weight 50.9 kg (112 lb 4.8 oz), SpO2 96 %.  PHYSICAL EXAMINATION:  GENERAL:  77 y.o.-year-old patient lying in the bed with no acute distress.  EYES: Pupils equal, round, reactive to light and accommodation. No scleral icterus. Extraocular muscles intact.  Left eye with periorbital hematoma HEENT: Head atraumatic, normocephalic. Oropharynx and nasopharynx clear.  NECK:  Supple, no jugular venous distention. No thyroid enlargement, no tenderness.  LUNGS: Normal breath sounds bilaterally, no wheezing, rales,rhonchi or crepitation. No use of accessory muscles of respiration.  CARDIOVASCULAR: S1, S2 normal. No murmurs, rubs, or gallops.  ABDOMEN: Soft, nontender, nondistended. Bowel sounds present. No organomegaly or mass.  EXTREMITIES: No pedal edema, cyanosis, or clubbing.  NEUROLOGIC: Cranial nerves II through XII are intact. Muscle strength 5/5 in all extremities. Sensation intact. Gait not checked.  PSYCHIATRIC: The patient is alert and oriented x 3.  SKIN: No obvious rash, lesion, or ulcer.    LABORATORY PANEL:   CBC Recent Labs  Lab 05/03/18 0525  WBC 9.9  HGB 12.3  HCT 36.1  PLT 195   ------------------------------------------------------------------------------------------------------------------  Chemistries  Recent Labs  Lab 05/01/18 1505  05/03/18 0525  NA 134*   < > 138  K 3.8   < > 3.4*  CL 100*   < > 109  CO2 21*   < > 21*  GLUCOSE 122*   < > 87   BUN 17   < > 9  CREATININE 1.09*   < > 0.75  CALCIUM 9.0   < > 8.3*  AST 36  --   --   ALT 17  --   --   ALKPHOS 83  --   --   BILITOT 0.4  --   --    < > = values in this interval not displayed.   ------------------------------------------------------------------------------------------------------------------  Cardiac Enzymes Recent Labs  Lab 05/01/18 1505  TROPONINI <0.03   ------------------------------------------------------------------------------------------------------------------  RADIOLOGY:  Dg Chest 1 View  Result Date: 05/04/2018 CLINICAL DATA:  Cough.  Hypertension. EXAM: CHEST  1 VIEW COMPARISON:  05/01/2018.  04/08/2018. FINDINGS: Heart size is normal. Chronic aortic atherosclerosis. No sign of active infiltrate, mass, effusion or collapse. IMPRESSION: No active disease. Electronically Signed   By: Nelson Chimes M.D.   On: 05/04/2018 11:24    EKG:   Orders placed or performed during the hospital encounter of 05/01/18  . ED EKG  . ED EKG    ASSESSMENT AND PLAN:    77 year old female with past medical history of Alzheimer's dementia, Parkinson's disease, hypertension, previous history of urinary tract infection, GERD, anxiety who presents to the hospital due to recurrent falls, generalized weakness and poor p.o. intake and noted to have a urinary tract infection.  1. Failure to thrive-dietary consult, speech therapy recommending dysphagia level 1 pured diet with thin liquids, meds crushed with pure We will do calorie count If no improvement will consider palliative care consult discussed with the daughter agrees  2.  Recurrent falls/generalized weakness-multifactorial in nature.  Related to underlying dementia with Parkinson's disease combined with 5 to thrive    We will get a physical therapy consult to assess her mobility.  3.    Rash on the right gluteal area-probably genital herpes differential diagnosis zoster On Valtrex ID is following HSV PCR  positive, discussed with Dr. Ola Spurr plan is to continue Valtrex 500 mg p.o. once daily to prevent future recurrences as a prophylactic medicine  4.  Dementia-patient is high risk for sundowning. -Continue Lamictal, Namenda, Exelon patch. -We will order some as needed Haldol for agitation.  5. History of Parkinson's disease-continue Sinemet.   6.  Chest x-ray with infiltrate- pneumonia continue IV Rocephin and added azithromycin, total antibiotic course will be 5 days , repeat chest x-ray with no pneumonia  breathing treatments as needed and sputum culture and sensitivity if we can get a sample-   7.GERD-continue Protonix.  8.  Urinary tract infection- ruled out with a negative urine culture and sensitivity Palliative care consult is still pending  Physical therapy is recommending skilled nursing facility   All the records are reviewed and case discussed with Care Management/Social Workerr.  Plan of care discussed in detail with the patient's daughter came over phone and recommended home with home health, daughter prefers [facility] rehab center at this time as she has 2 bedroom apartment and so she will moved to 3 bedroom apartment and take mom home VM left to pts daughter [name] CODE STATUS: fc   Disposition SNF tomorrow  TOTAL TIME TAKING CARE OF THIS PATIENT: 37 minutes.   POSSIBLE D/C IN 1 DAYS, DEPENDING ON CLINICAL CONDITION.  Note: This dictation was prepared with Dragon dictation along with smaller phrase technology. Any transcriptional errors that result from this process are unintentional.   Nicholes Mango M.D on 05/04/2018 at 3:39 PM  Between 7am to 6pm - Pager - [phone] After 6pm go to [email or web address] - password [email or web address] [facility]  [facility]  Office  [phone]  CC: Primary care physician; [name], MD

## 2018-05-05 DIAGNOSIS — N390 Urinary tract infection, site not specified: Secondary | ICD-10-CM | POA: Diagnosis not present

## 2018-05-05 DIAGNOSIS — Z741 Need for assistance with personal care: Secondary | ICD-10-CM | POA: Diagnosis not present

## 2018-05-05 DIAGNOSIS — F39 Unspecified mood [affective] disorder: Secondary | ICD-10-CM | POA: Diagnosis not present

## 2018-05-05 DIAGNOSIS — Z7189 Other specified counseling: Secondary | ICD-10-CM

## 2018-05-05 DIAGNOSIS — F13282 Sedative, hypnotic or anxiolytic dependence with sedative, hypnotic or anxiolytic-induced sleep disorder: Secondary | ICD-10-CM | POA: Diagnosis not present

## 2018-05-05 DIAGNOSIS — G20 Parkinson's disease: Secondary | ICD-10-CM | POA: Diagnosis not present

## 2018-05-05 DIAGNOSIS — Z515 Encounter for palliative care: Secondary | ICD-10-CM

## 2018-05-05 DIAGNOSIS — R262 Difficulty in walking, not elsewhere classified: Secondary | ICD-10-CM | POA: Diagnosis not present

## 2018-05-05 DIAGNOSIS — W19XXXA Unspecified fall, initial encounter: Secondary | ICD-10-CM

## 2018-05-05 DIAGNOSIS — R4189 Other symptoms and signs involving cognitive functions and awareness: Secondary | ICD-10-CM | POA: Diagnosis not present

## 2018-05-05 DIAGNOSIS — E441 Mild protein-calorie malnutrition: Secondary | ICD-10-CM | POA: Diagnosis not present

## 2018-05-05 DIAGNOSIS — G3183 Dementia with Lewy bodies: Secondary | ICD-10-CM | POA: Diagnosis not present

## 2018-05-05 DIAGNOSIS — E440 Moderate protein-calorie malnutrition: Secondary | ICD-10-CM | POA: Diagnosis not present

## 2018-05-05 DIAGNOSIS — G40909 Epilepsy, unspecified, not intractable, without status epilepticus: Secondary | ICD-10-CM | POA: Diagnosis not present

## 2018-05-05 DIAGNOSIS — G309 Alzheimer's disease, unspecified: Secondary | ICD-10-CM | POA: Diagnosis not present

## 2018-05-05 DIAGNOSIS — K219 Gastro-esophageal reflux disease without esophagitis: Secondary | ICD-10-CM | POA: Diagnosis not present

## 2018-05-05 DIAGNOSIS — A6000 Herpesviral infection of urogenital system, unspecified: Secondary | ICD-10-CM | POA: Diagnosis not present

## 2018-05-05 DIAGNOSIS — R296 Repeated falls: Secondary | ICD-10-CM | POA: Diagnosis not present

## 2018-05-05 LAB — VARICELLA-ZOSTER BY PCR: VARICELLA-ZOSTER, PCR: NEGATIVE

## 2018-05-05 LAB — BASIC METABOLIC PANEL
Anion gap: 7 (ref 5–15)
CALCIUM: 8.5 mg/dL — AB (ref 8.9–10.3)
CO2: 24 mmol/L (ref 22–32)
Chloride: 107 mmol/L (ref 101–111)
Creatinine, Ser: 0.64 mg/dL (ref 0.44–1.00)
GFR calc Af Amer: >60 mL/min (ref >60)
GLUCOSE: 102 mg/dL — AB (ref 65–99)
Potassium: 3.7 mmol/L (ref 3.5–5.1)
SODIUM: 138 mmol/L (ref 135–145)

## 2018-05-05 MED ORDER — ENSURE ENLIVE PO LIQD: 237.0000 mL | Freq: Two times a day (BID) | ORAL | Status: DC

## 2018-05-05 MED ORDER — ENSURE ENLIVE PO LIQD: 237.0000 mL | Freq: Two times a day (BID) | ORAL | 12 refills | Status: AC

## 2018-05-05 MED ORDER — MEGESTROL ACETATE 40 MG PO TABS: 160.0000 mg | ORAL_TABLET | Freq: Three times a day (TID) | ORAL | Status: DC

## 2018-05-05 MED ORDER — VALACYCLOVIR HCL 500 MG PO TABS: 500.0000 mg | ORAL_TABLET | Freq: Every day | ORAL | 0 refills | Status: AC

## 2018-05-05 NOTE — Discharge Summary (Addendum)
[facility] [place] NAME: Theresa Sloan    MR#:  [id number]  DATE OF BIRTH:  1941-05-02  DATE OF ADMISSION:  05/01/2018 ADMITTING PHYSICIAN: Theresa Leber, MD  DATE OF DISCHARGE:  05/05/18   PRIMARY CARE PHYSICIAN: Theresa Frizzle, MD    ADMISSION DIAGNOSIS:  Contusion of scalp, initial encounter [S00.03XA] Fall, initial encounter [W19.XXXA] Urinary tract infection with hematuria, site unspecified [N39.0, R31.9]  DISCHARGE DIAGNOSIS:  Active Problems:   UTI (urinary tract infection)   Malnutrition of moderate degree   Fall   Parkinson disease [facility])   Advance care planning   Goals of care, counseling/discussion   Palliative care by specialist   SECONDARY DIAGNOSIS:   Past Medical History:  Diagnosis Date  . Alzheimer disease   . Anxiety   . Dementia   . 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 Gait difficulty 03/05/2015  . GERD (gastroesophageal reflux disease)   . Hyperlipidemia   . Hypertension   . Memory difficulty 08/07/2014  . TMJ (dislocation of temporomandibular joint)     HOSPITAL COURSE:   HPI  Theresa Sloan  is a 77 y.o. female with a known history of advanced Alzheimer's dementia, Parkinson's disease, hypertension, hyperlipidemia, anxiety who presents to the hospital due to recurrent falls and generalized weakness.  Patient herself is a very poor historian therefore most of the history obtained from the daughter at bedside.  As per the daughter patient has had 5 falls in the past month.  Today she was found down her assisted living this morning.  She was brought to the ER for further evaluation.  On routine blood work patient was noted to have a urinary tract infection.  Patient was suspected to have a UTI at her assisted living and started on oral Bactrim just a few days ago and has 2 more days of therapy left.  Despite being on antibiotics she feels increasingly weak, she is not eating  well and has had recurrent falls and therefore brought to the ER for further evaluation.    1. Failure to thrive-dietary consult, speech therapy recommending dysphagia level 1 pured diet with thin liquids, meds crushed with pure We will do calorie count If no improvement will consider palliative care consult discussed with the daughter agrees   2. Recurrent falls/generalized weakness-multifactorial in nature. Related to underlying dementia with Parkinson's disease combined with 5 to thrive   We will get a physical therapy consult to assess her mobility.  3.   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 Ola Sloan plan is to continue Valtrex 500 mg p.o. once daily to prevent future recurrences as a prophylactic medicine No isolation needed  4. Dementia-patient is high risk for sundowning. -Continue Lamictal, Namenda, Exelon patch. -We will order some as needed Haldol for agitation.  5. History of Parkinson's disease-continue Sinemet.   6.  Chest x-ray with infiltrate- pneumonia continue IV Rocephin and added azithromycin, total antibiotic course will be 5 days , repeat chest x-ray with no pneumonia  breathing treatments as needed and sputum culture and sensitivity if we can get a sample-  7.GERD-continue Protonix.  8.  Urinary tract infection- ruled out with a negative urine culture and sensitivity Palliative care consult : Discussed with patient's daughter Patient's prognosis is less than 6 months according to their note  Physical therapy is recommending skilled nursing facility  at [facility]  I had a lengthy discussion with the patient's daughter today regarding patient's clinical situation and how she is gradually deteriorating  Daughter Theresa Sloan has discussed with other family members and decided to go to [facility], with palliative care follow-up as per her  discussion with the social worker   Disposition [facility] [place], with outpatient palliative care follow-up   DISCHARGE CONDITIONS:   guarded  CONSULTS OBTAINED:     PROCEDURES    DRUG ALLERGIES:  No Known Allergies  DISCHARGE MEDICATIONS:   Allergies as of 05/05/2018   No Known Allergies     Medication List    STOP taking these medications   sulfamethoxazole-trimethoprim 800-160 MG tablet Commonly known as:  BACTRIM DS,SEPTRA DS     TAKE these medications   atorvastatin 80 MG tablet Commonly known as:  LIPITOR Take 80 mg by mouth every evening.   carbidopa-levodopa-entacapone 25-100-200 MG tablet Commonly known as:  STALEVO 100 Take 1 tablet by mouth 3 (three) times daily.   escitalopram 20 MG tablet Commonly known as:  LEXAPRO TAKE ONE (1) TABLET BY MOUTH EVERY DAY   feeding supplement (ENSURE ENLIVE) Liqd Take 237 mLs by mouth 2 (two) times daily between meals. Start taking on:  05/06/2018   lamoTRIgine 25 MG tablet Commonly known as:  LAMICTAL Take 2 tablets (50 mg total) by mouth 2 (two) times daily.   megestrol 40 MG tablet Commonly known as:  MEGACE Take 4 tablets (160 mg total) by mouth 3 (three) times daily.   memantine 10 MG tablet Commonly known as:  NAMENDA Take 1 tablet (10 mg total) by mouth 2 (two) times daily.   omeprazole 20 MG capsule Commonly known as:  PRILOSEC Take 1 capsule (20 mg total) by mouth daily.   PRESERVISION AREDS 2 PO Take 1 capsule by mouth 2 (two) times daily.   propranolol ER 60 MG 24 hr capsule Commonly known as:  INDERAL LA TAKE ONE (1) CAPSULE EACH DAY   rivastigmine 9.5 mg/24hr Commonly known as:  EXELON APPLY 1 PATCH TO THE SKIN DAILY What changed:    how much to take  how to take this  when to take this  additional instructions   TYLENOL 500 MG tablet Generic drug:  acetaminophen Take 500 mg by mouth daily.   TYLENOL 8 HOUR 650 MG CR tablet Generic drug:  acetaminophen Take 650 mg by mouth  every 6 (six) hours as needed for pain.   UNABLE TO FIND Take 4 oz by mouth every evening. Med Name: Elmer Picker   valACYclovir 500 MG tablet Commonly known as:  VALTREX Take 1 tablet (500 mg total) by mouth daily.        DISCHARGE INSTRUCTIONS:   Follow-up with primary care physician at the facility in 3 to 5 days Outpatient palliative care follow-up  DIET:  Dysphagia 1 diet with supplements  DISCHARGE CONDITION:  guarded  ACTIVITY:  Activity as tolerated   OXYGEN:  Home Oxygen: No.   Oxygen Delivery: room air  DISCHARGE LOCATION:  [place]   If you experience worsening of your admission symptoms, develop shortness of breath, life threatening emergency, suicidal or homicidal thoughts you must seek medical attention immediately by calling 911 or calling your MD immediately  if symptoms less severe.  You Must read complete instructions/literature along with all the possible adverse reactions/side effects for all the Medicines you take and that have been prescribed to you. Take any new Medicines after you have completely understood and  accpet all the possible adverse reactions/side effects.   Please note  You were cared for by a hospitalist during your hospital stay. If you have any questions about your discharge medications or the care you received while you were in the hospital after you are discharged, you can call the unit and asked to speak with the hospitalist on call if the hospitalist that took care of you is not available. Once you are discharged, your primary care physician will handle any further medical issues. Please note that NO REFILLS for any discharge medications will be authorized once you are discharged, as it is imperative that you return to your primary care physician (or establish a relationship with a primary care physician if you do not have one) for your aftercare needs so that they can reassess your need for medications and monitor your lab  values.     Today  Chief Complaint  Patient presents with  . Fall   Patient is very confused and half naked during my examination and not eating and drinking as reported by the RN.  Situation updated to the patient's daughter came over phone.  She has discussed with palliative care, daughter prefers her mom going to skilled nursing facility and outpatient palliative care follow-up  ROS:  Unobtainable   VITAL SIGNS:  Blood pressure 121/70, pulse 66, temperature 98 F (36.7 C), temperature source Oral, resp. rate 19, height 5\' 2"  (1.575 m), weight 50.9 kg (112 lb 4.8 oz), SpO2 99 %.  I/O:    Intake/Output Summary (Last 24 hours) at 05/05/2018 1544 Last data filed at 05/05/2018 1031 Gross per 24 hour  Intake 395 ml  Output -  Net 395 ml    PHYSICAL EXAMINATION:  GENERAL:  77 y.o.-year-old patient lying in the bed with no acute distress.  EYES: Pupils equal, round, reactive to light and accommodation. No scleral icterus. Extraocular muscles intact.  Periorbital bruising HEENT: Head atraumatic, normocephalic. Oropharynx and nasopharynx clear.  NECK:  Supple, no jugular venous distention. No thyroid enlargement, no tenderness.  LUNGS: Normal breath sounds bilaterally, no wheezing, rales,rhonchi or crepitation. No use of accessory muscles of respiration.  CARDIOVASCULAR: S1, S2 normal. No murmurs, rubs, or gallops.  ABDOMEN: Soft, non-tender, non-distended. Bowel sounds present. No organomegaly or mass.  EXTREMITIES: No pedal edema, cyanosis, or clubbing.  Multiple bruises nEUROLOGIC: Awake and alert and disoriented PSYCHIATRIC: The patient is alert and disoriented SKIN: No obvious rash, lesion, or ulcer.   DATA REVIEW:   CBC Recent Labs  Lab 05/03/18 0525  WBC 9.9  HGB 12.3  HCT 36.1  PLT 195    Chemistries  Recent Labs  Lab 05/01/18 1505  05/05/18 0554  NA 134*   < > 138  K 3.8   < > 3.7  CL 100*   < > 107  CO2 21*   < > 24  GLUCOSE 122*   < > 102*  BUN 17   <  > <5*  CREATININE 1.09*   < > 0.64  CALCIUM 9.0   < > 8.5*  AST 36  --   --   ALT 17  --   --   ALKPHOS 83  --   --   BILITOT 0.4  --   --    < > = values in this interval not displayed.    Cardiac Enzymes Recent Labs  Lab 05/01/18 1505  TROPONINI <0.03    Microbiology Results  Results for orders placed or performed during the hospital  encounter of 05/01/18  Urine culture     Status: None   Collection Time: 05/01/18  4:12 PM  Result Value Ref Range Status   Specimen Description   Final    URINE, RANDOM Performed at [facility], [address] [place], [place] [address]    Special Requests   Final    NONE Performed at [facility], [address] [place], [place] [address]    Culture   Final    NO GROWTH Performed at [facility], [place] [address], [place], [place] [address]    Report Status 05/03/2018 FINAL  Final  MRSA PCR Screening     Status: None   Collection Time: 05/01/18  6:56 PM  Result Value Ref Range Status   MRSA by PCR NEGATIVE NEGATIVE Final    Comment:        The GeneXpert MRSA Assay (FDA approved for NASAL specimens only), is one component of a comprehensive MRSA colonization surveillance program. It is not intended to diagnose MRSA infection nor to guide or monitor treatment for MRSA infections. Performed at [facility], [address], [place],  [address]     RADIOLOGY:  Dg Chest 1 View  Result Date: 05/04/2018 CLINICAL DATA:  Cough.  Hypertension. EXAM: CHEST  1 VIEW COMPARISON:  05/01/2018.  04/08/2018. FINDINGS: Heart size is normal. Chronic aortic atherosclerosis. No sign of active infiltrate, mass, effusion or collapse. IMPRESSION: No active disease. Electronically Signed   By: Nelson Chimes M.D.   On: 05/04/2018 11:24    EKG:   Orders placed or performed during the hospital encounter of 05/01/18  . ED EKG  . ED EKG      Management plans discussed with the patient, family and they are  in agreement.  CODE STATUS:     Code Status Orders  (From admission, onward)        Start     Ordered   05/01/18 1841  Full code  Continuous     05/01/18 1840    Code Status History    This patient has a current code status but no historical code status.    Advance Directive Documentation     Most Recent Value  Type of Advance Directive  Healthcare Power of Attorney  Pre-existing out of facility DNR order (yellow form or pink MOST form)  -  "MOST" Form in Place?  -      TOTAL TIME TAKING CARE OF THIS PATIENT: 43 minutes.   Note: This dictation was prepared with Dragon dictation along with smaller phrase technology. Any transcriptional errors that result from this process are unintentional.   @[facility] @  on 05/05/2018 at 3:44 PM  Between 7am to 6pm - Pager - [phone]  After 6pm go to [email or web address] - password [email or web address] [facility]  [facility]  Office  [phone]  CC: Primary care physician; Theresa Frizzle, MD

## 2018-05-05 NOTE — Progress Notes (Signed)
Nutrition Follow Up Note   DOCUMENTATION CODES:   Non-severe (moderate) malnutrition in context of chronic illness  INTERVENTION:   Add Ensure Enlive po BID, each supplement provides 350 kcal and 20 grams of protein  Vital Cuisine TID, each supplement provides 520kcal and 22g of protein.   Magic cup TID with meals, each supplement provides 290 kcal and 9 grams of protein  MVI daily   NUTRITION DIAGNOSIS:   Moderate Malnutrition related to chronic illness(dementia, parkinsons ) as evidenced by moderate fat depletion, moderate muscle depletion.  GOAL:   Patient will meet greater than or equal to 90% of their needs -not met  MONITOR:   PO intake, Supplement acceptance, Labs, Weight trends, Skin, I & O's  ASSESSMENT:   77 y.o. female with a known history of advanced Alzheimer's dementia, Parkinson's disease, hypertension, hyperlipidemia, anxiety who presents to the hospital due to recurrent falls and generalized weakness.     Pt continues to have poor appetite and oral intake since admit; pt eating only bites of meals. Pt receiving supplements on her meals trays; RD will also add Ensure to help pt meet her estimated needs. Megace initiated 6/5. Pt likely at high refeeding risk. No new weight since admit; recommend obtain new weight. RD will continue to monitor for [place]; per palliative note, family meeting scheduled for today.    Medications reviewed and include: lovenox, megace, MVI, protonix, ceftriaxone  Labs reviewed: BUN <5(L), Ca 8.5(L)  Diet Order:   Diet Order           DIET - DYS 1 Room service appropriate? Yes with Assist; Fluid consistency: Thin  Diet effective now         EDUCATION NEEDS:   Education needs have been addressed(with pt's daughter)  Skin:  Skin Assessment: Reviewed RN Assessment(ecchymosis, skin tears)  Last BM:  6/4- type 6  Height:   Ht Readings from Last 1 Encounters:  05/01/18 '5\' 2"'  (1.575 m)    Weight:   Wt Readings from Last 1  Encounters:  05/01/18 112 lb 4.8 oz (50.9 kg)    Ideal Body Weight:  50 kg  BMI:  Body mass index is 20.54 kg/m.  Estimated Nutritional Needs:   Kcal:  1200-1400kcal/day   Protein:  66-76g/day  Fluid:  >1.2L/day   Koleen Distance MS, RD, LDN Pager #- [phone] Office#- [phone] After Hours Pager: [phone]

## 2018-05-05 NOTE — Clinical Social Work Note (Signed)
Patient to discharge today to [facility] with Palliative following. CSW has spoken to patient's daughter several times today to offer support. Raquel Sarna at [facility] has the discharge information and patient's nurse to call report and patient's daughter to transport. Shela Leff MSW,LCSW [phone]

## 2018-05-05 NOTE — Discharge Instructions (Signed)
Follow-up with primary care physician at the facility in 3 to 5 days Outpatient palliative care follow-up

## 2018-05-05 NOTE — Clinical Social Work Placement (Signed)
   CLINICAL SOCIAL WORK PLACEMENT  NOTE  Date:  05/05/2018  Patient Details  Name: Theresa Sloan MRN: [id number] Date of Birth: 13-Jan-1941  Clinical Social Work is seeking post-discharge placement for this patient at the [place] level of care (*CSW will initial, date and re-position this form in  chart as items are completed):  Yes   Patient/family provided with [place] Work Department's list of facilities offering this level of care within the geographic area requested by the patient (or if unable, by the patient's family).  Yes   Patient/family informed of their freedom to choose among providers that offer the needed level of care, that participate in Medicare, Medicaid or managed care program needed by the patient, have an available bed and are willing to accept the patient.  Yes   Patient/family informed of [facility]'s ownership interest in [facility] and [facility], as well as of the fact that they are under no obligation to receive care at these facilities.  PASRR submitted to EDS on 05/02/18     PASRR number received on 05/03/18     Existing PASRR number confirmed on       FL2 transmitted to all facilities in geographic area requested by pt/family on 05/02/18     FL2 transmitted to all facilities within larger geographic area on       Patient informed that his/her managed care company has contracts with or will negotiate with certain facilities, including the following:        Yes   Patient/family informed of bed offers received.  Patient chooses bed at [facility])     Physician recommends and patient chooses bed at [facility])    Patient to be transferred to [facility]) on 05/05/18.  Patient to be transferred to facility by daughter     Patient family notified on 05/05/18 of transfer.  Name of family member notified:  Maudie Mercury)     PHYSICIAN       Additional Comment:     _______________________________________________ Shela Leff, LCSW 05/05/2018, 2:13 PM

## 2018-05-05 NOTE — Progress Notes (Signed)
Theresa Sloan to be D/C'd Theresa Sloan per MD order.  Discussed prescriptions and follow up appointments with the patient. Prescriptions given to patient, medication list explained in detail. Pt verbalized understanding.  Allergies as of 05/05/2018   No Known Allergies     Medication List    STOP taking these medications   sulfamethoxazole-trimethoprim 800-160 MG tablet Commonly known as:  BACTRIM DS,SEPTRA DS     TAKE these medications   atorvastatin 80 MG tablet Commonly known as:  LIPITOR Take 80 mg by mouth every evening.   carbidopa-levodopa-entacapone 25-100-200 MG tablet Commonly known as:  STALEVO 100 Take 1 tablet by mouth 3 (three) times daily.   escitalopram 20 MG tablet Commonly known as:  LEXAPRO TAKE ONE (1) TABLET BY MOUTH EVERY DAY   feeding supplement (ENSURE ENLIVE) Liqd Take 237 mLs by mouth 2 (two) times daily between meals. Start taking on:  05/06/2018   lamoTRIgine 25 MG tablet Commonly known as:  LAMICTAL Take 2 tablets (50 mg total) by mouth 2 (two) times daily.   megestrol 40 MG tablet Commonly known as:  MEGACE Take 4 tablets (160 mg total) by mouth 3 (three) times daily.   memantine 10 MG tablet Commonly known as:  NAMENDA Take 1 tablet (10 mg total) by mouth 2 (two) times daily.   omeprazole 20 MG capsule Commonly known as:  PRILOSEC Take 1 capsule (20 mg total) by mouth daily.   PRESERVISION AREDS 2 PO Take 1 capsule by mouth 2 (two) times daily.   propranolol ER 60 MG 24 hr capsule Commonly known as:  INDERAL LA TAKE ONE (1) CAPSULE EACH DAY   rivastigmine 9.5 mg/24hr Commonly known as:  EXELON APPLY 1 PATCH TO THE SKIN DAILY What changed:    how much to take  how to take this  when to take this  additional instructions   TYLENOL 500 MG tablet Generic drug:  acetaminophen Take 500 mg by mouth daily.   TYLENOL 8 HOUR 650 MG CR tablet Generic drug:  acetaminophen Take 650 mg by mouth every 6 (six) hours as needed for  pain.   UNABLE TO FIND Take 4 oz by mouth every evening. Med Name: Elmer Picker   valACYclovir 500 MG tablet Commonly known as:  VALTREX Take 1 tablet (500 mg total) by mouth daily.       Vitals:   05/05/18 0814 05/05/18 1100  BP:  121/70  Pulse:  66  Resp:  19  Temp:  98 F (36.7 C)  SpO2: 100% 99%    Skin clean, dry and intact without evidence of skin break down, no evidence of skin tears noted. IV catheter discontinued intact. Site without signs and symptoms of complications. Dressing and pressure applied. Pt denies pain at this time. No complaints noted.  An After Visit Summary was printed and given to the patient. Patient escorted via Theresa Sloan, and D/C to [facility] by daughter.  Sharalyn Ink

## 2018-05-05 NOTE — Progress Notes (Signed)
New referral for outpatient PALLIATIVE to follow at [facility] received from [place]. Patient to discharge today. Patient information faxed to referral. Flo Shanks RN, BSN, [facility] [place], hospital Liaison [phone]

## 2018-05-06 ENCOUNTER — Telehealth: Payer: Self-pay | Admitting: Neurology

## 2018-05-06 DIAGNOSIS — F39 Unspecified mood [affective] disorder: Secondary | ICD-10-CM | POA: Diagnosis not present

## 2018-05-06 DIAGNOSIS — W19XXXA Unspecified fall, initial encounter: Secondary | ICD-10-CM | POA: Diagnosis not present

## 2018-05-06 DIAGNOSIS — G40909 Epilepsy, unspecified, not intractable, without status epilepticus: Secondary | ICD-10-CM | POA: Diagnosis not present

## 2018-05-06 DIAGNOSIS — G3183 Dementia with Lewy bodies: Secondary | ICD-10-CM | POA: Diagnosis not present

## 2018-05-06 DIAGNOSIS — K219 Gastro-esophageal reflux disease without esophagitis: Secondary | ICD-10-CM | POA: Diagnosis not present

## 2018-05-06 DIAGNOSIS — G20 Parkinson's disease: Secondary | ICD-10-CM | POA: Diagnosis not present

## 2018-05-06 DIAGNOSIS — E441 Mild protein-calorie malnutrition: Secondary | ICD-10-CM | POA: Diagnosis not present

## 2018-05-06 NOTE — Telephone Encounter (Signed)
I called the daughter.  Patient apparently had a significant decline in her overall general health, she is not eating well, she has developed parkinsonism, she has recent been in the hospital.  She has gone back to the extended care facility, they did a palliative care consult, she is a candidate for hospice if they desire.  The daughter will be meeting with the physician at the extended care facility today.

## 2018-05-10 DIAGNOSIS — F13282 Sedative, hypnotic or anxiolytic dependence with sedative, hypnotic or anxiolytic-induced sleep disorder: Secondary | ICD-10-CM

## 2018-05-24 DIAGNOSIS — G300 Alzheimer's disease with early onset: Secondary | ICD-10-CM | POA: Diagnosis not present

## 2018-05-24 DIAGNOSIS — R262 Difficulty in walking, not elsewhere classified: Secondary | ICD-10-CM | POA: Diagnosis not present

## 2018-05-26 DIAGNOSIS — R262 Difficulty in walking, not elsewhere classified: Secondary | ICD-10-CM | POA: Diagnosis not present

## 2018-05-26 DIAGNOSIS — G300 Alzheimer's disease with early onset: Secondary | ICD-10-CM | POA: Diagnosis not present

## 2018-05-30 DIAGNOSIS — G300 Alzheimer's disease with early onset: Secondary | ICD-10-CM | POA: Diagnosis not present

## 2018-05-30 DIAGNOSIS — G20 Parkinson's disease: Secondary | ICD-10-CM | POA: Diagnosis not present

## 2018-05-30 DIAGNOSIS — R262 Difficulty in walking, not elsewhere classified: Secondary | ICD-10-CM | POA: Diagnosis not present

## 2018-05-31 DIAGNOSIS — G20 Parkinson's disease: Secondary | ICD-10-CM | POA: Diagnosis not present

## 2018-05-31 DIAGNOSIS — G300 Alzheimer's disease with early onset: Secondary | ICD-10-CM | POA: Diagnosis not present

## 2018-05-31 DIAGNOSIS — R262 Difficulty in walking, not elsewhere classified: Secondary | ICD-10-CM | POA: Diagnosis not present

## 2018-06-02 DIAGNOSIS — G20 Parkinson's disease: Secondary | ICD-10-CM | POA: Diagnosis not present

## 2018-06-02 DIAGNOSIS — R262 Difficulty in walking, not elsewhere classified: Secondary | ICD-10-CM | POA: Diagnosis not present

## 2018-06-02 DIAGNOSIS — G300 Alzheimer's disease with early onset: Secondary | ICD-10-CM | POA: Diagnosis not present

## 2018-06-03 DIAGNOSIS — G40909 Epilepsy, unspecified, not intractable, without status epilepticus: Secondary | ICD-10-CM | POA: Diagnosis not present

## 2018-06-03 DIAGNOSIS — F39 Unspecified mood [affective] disorder: Secondary | ICD-10-CM | POA: Diagnosis not present

## 2018-06-03 DIAGNOSIS — G20 Parkinson's disease: Secondary | ICD-10-CM | POA: Diagnosis not present

## 2018-06-03 DIAGNOSIS — K219 Gastro-esophageal reflux disease without esophagitis: Secondary | ICD-10-CM | POA: Diagnosis not present

## 2018-06-03 DIAGNOSIS — E441 Mild protein-calorie malnutrition: Secondary | ICD-10-CM | POA: Diagnosis not present

## 2018-06-03 DIAGNOSIS — G3183 Dementia with Lewy bodies: Secondary | ICD-10-CM | POA: Diagnosis not present

## 2018-06-06 DIAGNOSIS — G20 Parkinson's disease: Secondary | ICD-10-CM | POA: Diagnosis not present

## 2018-06-06 DIAGNOSIS — G300 Alzheimer's disease with early onset: Secondary | ICD-10-CM | POA: Diagnosis not present

## 2018-06-06 DIAGNOSIS — R262 Difficulty in walking, not elsewhere classified: Secondary | ICD-10-CM | POA: Diagnosis not present

## 2018-06-07 DIAGNOSIS — G20 Parkinson's disease: Secondary | ICD-10-CM | POA: Diagnosis not present

## 2018-06-07 DIAGNOSIS — R262 Difficulty in walking, not elsewhere classified: Secondary | ICD-10-CM | POA: Diagnosis not present

## 2018-06-07 DIAGNOSIS — G300 Alzheimer's disease with early onset: Secondary | ICD-10-CM | POA: Diagnosis not present

## 2018-06-09 DIAGNOSIS — G300 Alzheimer's disease with early onset: Secondary | ICD-10-CM | POA: Diagnosis not present

## 2018-06-09 DIAGNOSIS — R262 Difficulty in walking, not elsewhere classified: Secondary | ICD-10-CM | POA: Diagnosis not present

## 2018-06-09 DIAGNOSIS — G20 Parkinson's disease: Secondary | ICD-10-CM | POA: Diagnosis not present

## 2018-06-24 DIAGNOSIS — H219 Unspecified disorder of iris and ciliary body: Secondary | ICD-10-CM | POA: Diagnosis not present

## 2018-06-24 DIAGNOSIS — B351 Tinea unguium: Secondary | ICD-10-CM | POA: Diagnosis not present

## 2018-07-05 ENCOUNTER — Telehealth: Payer: Self-pay | Admitting: Neurology

## 2018-07-05 NOTE — Telephone Encounter (Signed)
Pt's daughter [name]/DPR c/a appt on 8/9 . She said the pt is living at [facility] care unit. She is asking for a call from Dr Jannifer Franklin please.

## 2018-07-05 NOTE — Telephone Encounter (Signed)
I called the daughter.  The patient has been transferred to [facility] after a hospitalization on 04 May 1999 19.  The patient is getting some therapy.  She has significant dementia and her gait disorder is quite significant, it is unlikely the patient will ever be able to walk safely by herself in the future.  She will continue therapy there, we may consider a revisit sometime in the next 2 to 3 months.  The daughter will keep in touch.

## 2018-07-05 NOTE — Telephone Encounter (Signed)
I called the daughter, left a message, I will call back later.

## 2018-07-06 DIAGNOSIS — K219 Gastro-esophageal reflux disease without esophagitis: Secondary | ICD-10-CM | POA: Diagnosis not present

## 2018-07-06 DIAGNOSIS — G3183 Dementia with Lewy bodies: Secondary | ICD-10-CM | POA: Diagnosis not present

## 2018-07-06 DIAGNOSIS — F39 Unspecified mood [affective] disorder: Secondary | ICD-10-CM | POA: Diagnosis not present

## 2018-07-06 DIAGNOSIS — M1990 Unspecified osteoarthritis, unspecified site: Secondary | ICD-10-CM | POA: Diagnosis not present

## 2018-07-06 DIAGNOSIS — E43 Unspecified severe protein-calorie malnutrition: Secondary | ICD-10-CM | POA: Diagnosis not present

## 2018-07-06 DIAGNOSIS — G4089 Other seizures: Secondary | ICD-10-CM | POA: Diagnosis not present

## 2018-07-06 DIAGNOSIS — G20 Parkinson's disease: Secondary | ICD-10-CM | POA: Diagnosis not present

## 2018-07-08 ENCOUNTER — Encounter: Payer: Self-pay | Admitting: Podiatry

## 2018-07-08 ENCOUNTER — Ambulatory Visit: Payer: Medicare Other | Admitting: Neurology

## 2018-07-08 ENCOUNTER — Telehealth: Payer: Self-pay

## 2018-07-08 ENCOUNTER — Ambulatory Visit (INDEPENDENT_AMBULATORY_CARE_PROVIDER_SITE_OTHER): Payer: Medicare Other | Admitting: Podiatry

## 2018-07-08 DIAGNOSIS — S91204A Unspecified open wound of right lesser toe(s) with damage to nail, initial encounter: Secondary | ICD-10-CM

## 2018-07-08 NOTE — Telephone Encounter (Signed)
Opened in error

## 2018-07-11 NOTE — Progress Notes (Signed)
   HPI: 77 year old wheelchair bound female presenting today with a chief complaint of soreness to the right third toenail that began several weeks ago. She reports associated thickening and loosening of the nail. Wearing shoes increases the pain. She has not done anything for treatment. Patient is here for further evaluation and treatment.   Past Medical History:  Diagnosis Date  . Alzheimer disease   . Anxiety   . Dementia   . 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 Gait difficulty 03/05/2015  . GERD (gastroesophageal reflux disease)   . Hyperlipidemia   . Hypertension   . Memory difficulty 08/07/2014  . TMJ (dislocation of temporomandibular joint)      Physical Exam: General: The patient is alert and oriented x3 in no acute distress.  Dermatology: Partially detached right third toenail. Skin is warm, dry and supple bilateral lower extremities. Negative for open lesions or macerations.  Vascular: Palpable pedal pulses bilaterally. No edema or erythema noted. Capillary refill within normal limits.  Neurological: Epicritic and protective threshold grossly intact bilaterally.   Musculoskeletal Exam: Range of motion within normal limits to all pedal and ankle joints bilateral. Muscle strength 5/5 in all groups bilateral.   Assessment: 1. Partially detached nail right 3rd toe    Plan of Care:  1. Patient evaluated.  2. Mechanical debridement of the right 3rd toenail performed using a nail nipper. Filed with dremel without incident.  3. Return to clinic as needed.       Edrick Kins, DPM [facility]  Dr. Edrick Kins, DPM    [address] [place], [place] [address]                Office [phone]  Fax [phone]

## 2018-07-14 DIAGNOSIS — I10 Essential (primary) hypertension: Secondary | ICD-10-CM | POA: Diagnosis not present

## 2018-07-24 DIAGNOSIS — R05 Cough: Secondary | ICD-10-CM | POA: Diagnosis not present

## 2018-07-25 DIAGNOSIS — R05 Cough: Secondary | ICD-10-CM | POA: Diagnosis not present

## 2018-07-25 DIAGNOSIS — J302 Other seasonal allergic rhinitis: Secondary | ICD-10-CM | POA: Diagnosis not present

## 2018-08-02 ENCOUNTER — Ambulatory Visit: Payer: Medicare Other | Admitting: Adult Health

## 2018-08-03 ENCOUNTER — Telehealth: Payer: Self-pay

## 2018-08-03 NOTE — Telephone Encounter (Signed)
Spoke to pt's daughter, Maudie Mercury. She said thank you so much.

## 2018-08-03 NOTE — Telephone Encounter (Signed)
Please call her and let her know that I am sorry---I thought she wanted it stopped from the communication I got. I have asked them to restart it

## 2018-08-03 NOTE — Telephone Encounter (Signed)
PLEASE NOTE: All timestamps contained within this report are represented as [place] Standard Time. CONFIDENTIALTY NOTICE: This fax transmission is intended only for the addressee. It contains information that is legally privileged, confidential or otherwise protected from use or disclosure. If you are not the intended recipient, you are strictly prohibited from reviewing, disclosing, copying using or disseminating any of this information or taking any action in reliance on or regarding this information. If you have received this fax in error, please notify us immediately by telephone so that we can arrange for its return to [place]. Phone: [phone], Toll-Free: [phone], Fax: [phone] Page: 1 of 1 Call Id: [id number] [facility] [place] Patient Name: Theresa Sloan Gender: Female DOB: 1941/02/08 Age: 77 Y 3 M 4 D Return Phone Number: [phone] (Primary) Address: City/State/Zip: [place] Client [place] Night - Client Client Site [place] Physician Viviana Simpler - MD Contact Type Call Who Is Calling Patient / Member / Family / Caregiver Call Type Triage / Clinical Caller Name Anastasia Pall Relationship To Patient Daughter Return Phone Number [phone] (Primary) Chief Complaint Appetite decreased (> THREE MONTHS) Reason for Call Symptomatic / Request for [place] states that her mom is at a facility which is [facility]. She has been off of her medication that makes her eat cause she has no appetite. She is eating at 25% and she weighed 106 when she went into [facility] and she is now 36 Ibs. She is very upset that she her mother is not eating. Translation No Nurse Assessment Nurse: Kayleen Memos, RN, [place] Date/Time (Eastern Time): 08/02/2018 10:25:59 PM Confirm and document reason for call. If  symptomatic, describe symptoms. ---Caller states that a nurse at the facility asked to DC the Pt's Megace, and the daughter is very upset. Caller states the mother needs it for her appetite. Caller states she will handle this in the morning. Caller states the pharmacy will be giving it back to her tomorrow, and she is worried that her mother may not want to eat. Caller states she has talked to the nursing supervisor tonight. Caller is not with her, no triage to be completed at this time. Does the patient have any new or worsening symptoms? ---No Guidelines Guideline Title Affirmed Question Affirmed Notes Nurse Date/Time (Eastern Time) Disp. Time Eilene Ghazi Time) Disposition Final User 08/02/2018 10:37:26 PM Clinical Call Yes Kayleen Memos, RN, [place]

## 2018-08-05 DIAGNOSIS — G309 Alzheimer's disease, unspecified: Secondary | ICD-10-CM | POA: Diagnosis not present

## 2018-08-05 DIAGNOSIS — Z515 Encounter for palliative care: Secondary | ICD-10-CM | POA: Diagnosis not present

## 2018-08-07 DIAGNOSIS — E785 Hyperlipidemia, unspecified: Secondary | ICD-10-CM | POA: Diagnosis not present

## 2018-08-07 DIAGNOSIS — Z9181 History of falling: Secondary | ICD-10-CM | POA: Diagnosis not present

## 2018-08-07 DIAGNOSIS — I10 Essential (primary) hypertension: Secondary | ICD-10-CM | POA: Diagnosis not present

## 2018-08-07 DIAGNOSIS — G309 Alzheimer's disease, unspecified: Secondary | ICD-10-CM | POA: Diagnosis not present

## 2018-08-07 DIAGNOSIS — Z681 Body mass index (BMI) 19 or less, adult: Secondary | ICD-10-CM | POA: Diagnosis not present

## 2018-08-07 DIAGNOSIS — F0280 Dementia in other diseases classified elsewhere without behavioral disturbance: Secondary | ICD-10-CM | POA: Diagnosis not present

## 2018-08-07 DIAGNOSIS — K219 Gastro-esophageal reflux disease without esophagitis: Secondary | ICD-10-CM | POA: Diagnosis not present

## 2018-08-07 DIAGNOSIS — R627 Adult failure to thrive: Secondary | ICD-10-CM | POA: Diagnosis not present

## 2018-08-07 DIAGNOSIS — L89311 Pressure ulcer of right buttock, stage 1: Secondary | ICD-10-CM | POA: Diagnosis not present

## 2018-08-07 DIAGNOSIS — F419 Anxiety disorder, unspecified: Secondary | ICD-10-CM | POA: Diagnosis not present

## 2018-08-07 DIAGNOSIS — R634 Abnormal weight loss: Secondary | ICD-10-CM | POA: Diagnosis not present

## 2018-08-07 DIAGNOSIS — R251 Tremor, unspecified: Secondary | ICD-10-CM | POA: Diagnosis not present

## 2018-08-08 DIAGNOSIS — G309 Alzheimer's disease, unspecified: Secondary | ICD-10-CM | POA: Diagnosis not present

## 2018-08-08 DIAGNOSIS — F419 Anxiety disorder, unspecified: Secondary | ICD-10-CM | POA: Diagnosis not present

## 2018-08-08 DIAGNOSIS — F0280 Dementia in other diseases classified elsewhere without behavioral disturbance: Secondary | ICD-10-CM | POA: Diagnosis not present

## 2018-08-08 DIAGNOSIS — E785 Hyperlipidemia, unspecified: Secondary | ICD-10-CM | POA: Diagnosis not present

## 2018-08-08 DIAGNOSIS — R627 Adult failure to thrive: Secondary | ICD-10-CM | POA: Diagnosis not present

## 2018-08-08 DIAGNOSIS — I10 Essential (primary) hypertension: Secondary | ICD-10-CM | POA: Diagnosis not present

## 2018-08-09 DIAGNOSIS — R627 Adult failure to thrive: Secondary | ICD-10-CM | POA: Diagnosis not present

## 2018-08-09 DIAGNOSIS — G309 Alzheimer's disease, unspecified: Secondary | ICD-10-CM | POA: Diagnosis not present

## 2018-08-09 DIAGNOSIS — I10 Essential (primary) hypertension: Secondary | ICD-10-CM | POA: Diagnosis not present

## 2018-08-09 DIAGNOSIS — F419 Anxiety disorder, unspecified: Secondary | ICD-10-CM | POA: Diagnosis not present

## 2018-08-09 DIAGNOSIS — F0280 Dementia in other diseases classified elsewhere without behavioral disturbance: Secondary | ICD-10-CM | POA: Diagnosis not present

## 2018-08-09 DIAGNOSIS — E785 Hyperlipidemia, unspecified: Secondary | ICD-10-CM | POA: Diagnosis not present

## 2018-08-10 DIAGNOSIS — R627 Adult failure to thrive: Secondary | ICD-10-CM | POA: Diagnosis not present

## 2018-08-10 DIAGNOSIS — F0280 Dementia in other diseases classified elsewhere without behavioral disturbance: Secondary | ICD-10-CM | POA: Diagnosis not present

## 2018-08-10 DIAGNOSIS — I10 Essential (primary) hypertension: Secondary | ICD-10-CM | POA: Diagnosis not present

## 2018-08-10 DIAGNOSIS — E785 Hyperlipidemia, unspecified: Secondary | ICD-10-CM | POA: Diagnosis not present

## 2018-08-10 DIAGNOSIS — G309 Alzheimer's disease, unspecified: Secondary | ICD-10-CM | POA: Diagnosis not present

## 2018-08-10 DIAGNOSIS — F419 Anxiety disorder, unspecified: Secondary | ICD-10-CM | POA: Diagnosis not present

## 2018-08-11 DIAGNOSIS — R627 Adult failure to thrive: Secondary | ICD-10-CM | POA: Diagnosis not present

## 2018-08-11 DIAGNOSIS — I10 Essential (primary) hypertension: Secondary | ICD-10-CM | POA: Diagnosis not present

## 2018-08-11 DIAGNOSIS — F419 Anxiety disorder, unspecified: Secondary | ICD-10-CM | POA: Diagnosis not present

## 2018-08-11 DIAGNOSIS — G309 Alzheimer's disease, unspecified: Secondary | ICD-10-CM | POA: Diagnosis not present

## 2018-08-11 DIAGNOSIS — F0280 Dementia in other diseases classified elsewhere without behavioral disturbance: Secondary | ICD-10-CM | POA: Diagnosis not present

## 2018-08-11 DIAGNOSIS — E785 Hyperlipidemia, unspecified: Secondary | ICD-10-CM | POA: Diagnosis not present

## 2018-08-12 DIAGNOSIS — F0280 Dementia in other diseases classified elsewhere without behavioral disturbance: Secondary | ICD-10-CM | POA: Diagnosis not present

## 2018-08-12 DIAGNOSIS — G309 Alzheimer's disease, unspecified: Secondary | ICD-10-CM | POA: Diagnosis not present

## 2018-08-12 DIAGNOSIS — F419 Anxiety disorder, unspecified: Secondary | ICD-10-CM | POA: Diagnosis not present

## 2018-08-12 DIAGNOSIS — I10 Essential (primary) hypertension: Secondary | ICD-10-CM | POA: Diagnosis not present

## 2018-08-12 DIAGNOSIS — R627 Adult failure to thrive: Secondary | ICD-10-CM | POA: Diagnosis not present

## 2018-08-12 DIAGNOSIS — E785 Hyperlipidemia, unspecified: Secondary | ICD-10-CM | POA: Diagnosis not present

## 2018-08-15 DIAGNOSIS — R627 Adult failure to thrive: Secondary | ICD-10-CM | POA: Diagnosis not present

## 2018-08-15 DIAGNOSIS — I10 Essential (primary) hypertension: Secondary | ICD-10-CM | POA: Diagnosis not present

## 2018-08-15 DIAGNOSIS — G309 Alzheimer's disease, unspecified: Secondary | ICD-10-CM | POA: Diagnosis not present

## 2018-08-15 DIAGNOSIS — E785 Hyperlipidemia, unspecified: Secondary | ICD-10-CM | POA: Diagnosis not present

## 2018-08-15 DIAGNOSIS — F419 Anxiety disorder, unspecified: Secondary | ICD-10-CM | POA: Diagnosis not present

## 2018-08-15 DIAGNOSIS — F0280 Dementia in other diseases classified elsewhere without behavioral disturbance: Secondary | ICD-10-CM | POA: Diagnosis not present

## 2018-08-16 DIAGNOSIS — I10 Essential (primary) hypertension: Secondary | ICD-10-CM | POA: Diagnosis not present

## 2018-08-16 DIAGNOSIS — G3183 Dementia with Lewy bodies: Secondary | ICD-10-CM | POA: Diagnosis not present

## 2018-08-16 DIAGNOSIS — F0280 Dementia in other diseases classified elsewhere without behavioral disturbance: Secondary | ICD-10-CM | POA: Diagnosis not present

## 2018-08-16 DIAGNOSIS — F39 Unspecified mood [affective] disorder: Secondary | ICD-10-CM

## 2018-08-16 DIAGNOSIS — G309 Alzheimer's disease, unspecified: Secondary | ICD-10-CM | POA: Diagnosis not present

## 2018-08-16 DIAGNOSIS — G4089 Other seizures: Secondary | ICD-10-CM | POA: Diagnosis not present

## 2018-08-16 DIAGNOSIS — F419 Anxiety disorder, unspecified: Secondary | ICD-10-CM | POA: Diagnosis not present

## 2018-08-16 DIAGNOSIS — R627 Adult failure to thrive: Secondary | ICD-10-CM | POA: Diagnosis not present

## 2018-08-16 DIAGNOSIS — G20 Parkinson's disease: Secondary | ICD-10-CM | POA: Diagnosis not present

## 2018-08-16 DIAGNOSIS — E441 Mild protein-calorie malnutrition: Secondary | ICD-10-CM | POA: Diagnosis not present

## 2018-08-16 DIAGNOSIS — E785 Hyperlipidemia, unspecified: Secondary | ICD-10-CM | POA: Diagnosis not present

## 2018-08-17 DIAGNOSIS — G309 Alzheimer's disease, unspecified: Secondary | ICD-10-CM | POA: Diagnosis not present

## 2018-08-17 DIAGNOSIS — F0280 Dementia in other diseases classified elsewhere without behavioral disturbance: Secondary | ICD-10-CM | POA: Diagnosis not present

## 2018-08-17 DIAGNOSIS — F419 Anxiety disorder, unspecified: Secondary | ICD-10-CM | POA: Diagnosis not present

## 2018-08-17 DIAGNOSIS — E785 Hyperlipidemia, unspecified: Secondary | ICD-10-CM | POA: Diagnosis not present

## 2018-08-17 DIAGNOSIS — R627 Adult failure to thrive: Secondary | ICD-10-CM | POA: Diagnosis not present

## 2018-08-17 DIAGNOSIS — I10 Essential (primary) hypertension: Secondary | ICD-10-CM | POA: Diagnosis not present

## 2018-08-18 DIAGNOSIS — R627 Adult failure to thrive: Secondary | ICD-10-CM | POA: Diagnosis not present

## 2018-08-18 DIAGNOSIS — I10 Essential (primary) hypertension: Secondary | ICD-10-CM | POA: Diagnosis not present

## 2018-08-18 DIAGNOSIS — E785 Hyperlipidemia, unspecified: Secondary | ICD-10-CM | POA: Diagnosis not present

## 2018-08-18 DIAGNOSIS — F0280 Dementia in other diseases classified elsewhere without behavioral disturbance: Secondary | ICD-10-CM | POA: Diagnosis not present

## 2018-08-18 DIAGNOSIS — F419 Anxiety disorder, unspecified: Secondary | ICD-10-CM | POA: Diagnosis not present

## 2018-08-18 DIAGNOSIS — G309 Alzheimer's disease, unspecified: Secondary | ICD-10-CM | POA: Diagnosis not present

## 2018-08-19 DIAGNOSIS — G309 Alzheimer's disease, unspecified: Secondary | ICD-10-CM | POA: Diagnosis not present

## 2018-08-19 DIAGNOSIS — F0280 Dementia in other diseases classified elsewhere without behavioral disturbance: Secondary | ICD-10-CM | POA: Diagnosis not present

## 2018-08-19 DIAGNOSIS — E785 Hyperlipidemia, unspecified: Secondary | ICD-10-CM | POA: Diagnosis not present

## 2018-08-19 DIAGNOSIS — F419 Anxiety disorder, unspecified: Secondary | ICD-10-CM | POA: Diagnosis not present

## 2018-08-19 DIAGNOSIS — R627 Adult failure to thrive: Secondary | ICD-10-CM | POA: Diagnosis not present

## 2018-08-19 DIAGNOSIS — I10 Essential (primary) hypertension: Secondary | ICD-10-CM | POA: Diagnosis not present

## 2018-08-22 DIAGNOSIS — E785 Hyperlipidemia, unspecified: Secondary | ICD-10-CM | POA: Diagnosis not present

## 2018-08-22 DIAGNOSIS — F0280 Dementia in other diseases classified elsewhere without behavioral disturbance: Secondary | ICD-10-CM | POA: Diagnosis not present

## 2018-08-22 DIAGNOSIS — G309 Alzheimer's disease, unspecified: Secondary | ICD-10-CM | POA: Diagnosis not present

## 2018-08-22 DIAGNOSIS — I10 Essential (primary) hypertension: Secondary | ICD-10-CM | POA: Diagnosis not present

## 2018-08-22 DIAGNOSIS — R627 Adult failure to thrive: Secondary | ICD-10-CM | POA: Diagnosis not present

## 2018-08-22 DIAGNOSIS — F419 Anxiety disorder, unspecified: Secondary | ICD-10-CM | POA: Diagnosis not present

## 2018-08-23 DIAGNOSIS — F419 Anxiety disorder, unspecified: Secondary | ICD-10-CM | POA: Diagnosis not present

## 2018-08-23 DIAGNOSIS — E785 Hyperlipidemia, unspecified: Secondary | ICD-10-CM | POA: Diagnosis not present

## 2018-08-23 DIAGNOSIS — R627 Adult failure to thrive: Secondary | ICD-10-CM | POA: Diagnosis not present

## 2018-08-23 DIAGNOSIS — F0280 Dementia in other diseases classified elsewhere without behavioral disturbance: Secondary | ICD-10-CM | POA: Diagnosis not present

## 2018-08-23 DIAGNOSIS — I10 Essential (primary) hypertension: Secondary | ICD-10-CM | POA: Diagnosis not present

## 2018-08-23 DIAGNOSIS — G309 Alzheimer's disease, unspecified: Secondary | ICD-10-CM | POA: Diagnosis not present

## 2018-08-24 DIAGNOSIS — R627 Adult failure to thrive: Secondary | ICD-10-CM | POA: Diagnosis not present

## 2018-08-24 DIAGNOSIS — F419 Anxiety disorder, unspecified: Secondary | ICD-10-CM | POA: Diagnosis not present

## 2018-08-24 DIAGNOSIS — E785 Hyperlipidemia, unspecified: Secondary | ICD-10-CM | POA: Diagnosis not present

## 2018-08-24 DIAGNOSIS — F0280 Dementia in other diseases classified elsewhere without behavioral disturbance: Secondary | ICD-10-CM | POA: Diagnosis not present

## 2018-08-24 DIAGNOSIS — I10 Essential (primary) hypertension: Secondary | ICD-10-CM | POA: Diagnosis not present

## 2018-08-24 DIAGNOSIS — G309 Alzheimer's disease, unspecified: Secondary | ICD-10-CM | POA: Diagnosis not present

## 2018-08-25 DIAGNOSIS — F419 Anxiety disorder, unspecified: Secondary | ICD-10-CM | POA: Diagnosis not present

## 2018-08-25 DIAGNOSIS — E785 Hyperlipidemia, unspecified: Secondary | ICD-10-CM | POA: Diagnosis not present

## 2018-08-25 DIAGNOSIS — I10 Essential (primary) hypertension: Secondary | ICD-10-CM | POA: Diagnosis not present

## 2018-08-25 DIAGNOSIS — R627 Adult failure to thrive: Secondary | ICD-10-CM | POA: Diagnosis not present

## 2018-08-25 DIAGNOSIS — G309 Alzheimer's disease, unspecified: Secondary | ICD-10-CM | POA: Diagnosis not present

## 2018-08-25 DIAGNOSIS — F0280 Dementia in other diseases classified elsewhere without behavioral disturbance: Secondary | ICD-10-CM | POA: Diagnosis not present

## 2018-08-26 DIAGNOSIS — R627 Adult failure to thrive: Secondary | ICD-10-CM | POA: Diagnosis not present

## 2018-08-26 DIAGNOSIS — G309 Alzheimer's disease, unspecified: Secondary | ICD-10-CM | POA: Diagnosis not present

## 2018-08-26 DIAGNOSIS — F0280 Dementia in other diseases classified elsewhere without behavioral disturbance: Secondary | ICD-10-CM | POA: Diagnosis not present

## 2018-08-26 DIAGNOSIS — I10 Essential (primary) hypertension: Secondary | ICD-10-CM | POA: Diagnosis not present

## 2018-08-26 DIAGNOSIS — F419 Anxiety disorder, unspecified: Secondary | ICD-10-CM | POA: Diagnosis not present

## 2018-08-26 DIAGNOSIS — E785 Hyperlipidemia, unspecified: Secondary | ICD-10-CM | POA: Diagnosis not present

## 2018-08-29 DIAGNOSIS — E785 Hyperlipidemia, unspecified: Secondary | ICD-10-CM | POA: Diagnosis not present

## 2018-08-29 DIAGNOSIS — R627 Adult failure to thrive: Secondary | ICD-10-CM | POA: Diagnosis not present

## 2018-08-29 DIAGNOSIS — G309 Alzheimer's disease, unspecified: Secondary | ICD-10-CM | POA: Diagnosis not present

## 2018-08-29 DIAGNOSIS — I10 Essential (primary) hypertension: Secondary | ICD-10-CM | POA: Diagnosis not present

## 2018-08-29 DIAGNOSIS — F419 Anxiety disorder, unspecified: Secondary | ICD-10-CM | POA: Diagnosis not present

## 2018-08-29 DIAGNOSIS — F0280 Dementia in other diseases classified elsewhere without behavioral disturbance: Secondary | ICD-10-CM | POA: Diagnosis not present

## 2018-08-31 DIAGNOSIS — B351 Tinea unguium: Secondary | ICD-10-CM | POA: Diagnosis not present

## 2018-09-27 DIAGNOSIS — R070 Pain in throat: Secondary | ICD-10-CM | POA: Diagnosis not present

## 2018-10-19 DIAGNOSIS — E43 Unspecified severe protein-calorie malnutrition: Secondary | ICD-10-CM | POA: Diagnosis not present

## 2018-10-19 DIAGNOSIS — F39 Unspecified mood [affective] disorder: Secondary | ICD-10-CM | POA: Diagnosis not present

## 2018-10-19 DIAGNOSIS — M1990 Unspecified osteoarthritis, unspecified site: Secondary | ICD-10-CM

## 2018-10-19 DIAGNOSIS — G3183 Dementia with Lewy bodies: Secondary | ICD-10-CM | POA: Diagnosis not present

## 2018-10-19 DIAGNOSIS — G20 Parkinson's disease: Secondary | ICD-10-CM | POA: Diagnosis not present

## 2018-10-19 DIAGNOSIS — G40909 Epilepsy, unspecified, not intractable, without status epilepticus: Secondary | ICD-10-CM | POA: Diagnosis not present

## 2018-10-27 ENCOUNTER — Emergency Department

## 2018-10-27 ENCOUNTER — Other Ambulatory Visit: Payer: Self-pay

## 2018-10-27 ENCOUNTER — Encounter: Payer: Self-pay | Admitting: Emergency Medicine

## 2018-10-27 ENCOUNTER — Emergency Department
Admission: EM | Admit: 2018-10-27 | Discharge: 2018-10-27 | Disposition: A | Discharge status: Skilled Nursing Facility | Attending: Emergency Medicine | Admitting: Emergency Medicine

## 2018-10-27 DIAGNOSIS — S0031XA Abrasion of nose, initial encounter: Secondary | ICD-10-CM | POA: Diagnosis not present

## 2018-10-27 DIAGNOSIS — F0280 Dementia in other diseases classified elsewhere without behavioral disturbance: Secondary | ICD-10-CM | POA: Diagnosis not present

## 2018-10-27 DIAGNOSIS — I10 Essential (primary) hypertension: Secondary | ICD-10-CM | POA: Diagnosis not present

## 2018-10-27 DIAGNOSIS — Z79899 Other long term (current) drug therapy: Secondary | ICD-10-CM | POA: Diagnosis not present

## 2018-10-27 DIAGNOSIS — G309 Alzheimer's disease, unspecified: Secondary | ICD-10-CM | POA: Diagnosis not present

## 2018-10-27 DIAGNOSIS — W07XXXA Fall from chair, initial encounter: Secondary | ICD-10-CM | POA: Insufficient documentation

## 2018-10-27 DIAGNOSIS — Y999 Unspecified external cause status: Secondary | ICD-10-CM | POA: Insufficient documentation

## 2018-10-27 DIAGNOSIS — J181 Lobar pneumonia, unspecified organism: Secondary | ICD-10-CM | POA: Diagnosis not present

## 2018-10-27 DIAGNOSIS — G20 Parkinson's disease: Secondary | ICD-10-CM | POA: Diagnosis not present

## 2018-10-27 DIAGNOSIS — S0081XA Abrasion of other part of head, initial encounter: Secondary | ICD-10-CM | POA: Insufficient documentation

## 2018-10-27 DIAGNOSIS — R918 Other nonspecific abnormal finding of lung field: Secondary | ICD-10-CM | POA: Diagnosis not present

## 2018-10-27 DIAGNOSIS — S0990XA Unspecified injury of head, initial encounter: Secondary | ICD-10-CM | POA: Diagnosis not present

## 2018-10-27 DIAGNOSIS — Y92128 Other place in nursing home as the place of occurrence of the external cause: Secondary | ICD-10-CM | POA: Diagnosis not present

## 2018-10-27 DIAGNOSIS — T148XXA Other injury of unspecified body region, initial encounter: Secondary | ICD-10-CM

## 2018-10-27 DIAGNOSIS — Y9389 Activity, other specified: Secondary | ICD-10-CM | POA: Insufficient documentation

## 2018-10-27 LAB — BASIC METABOLIC PANEL
Anion gap: 8 (ref 5–15)
BUN: 16 mg/dL (ref 8–23)
CALCIUM: 8.6 mg/dL — AB (ref 8.9–10.3)
CHLORIDE: 111 mmol/L (ref 98–111)
CO2: 22 mmol/L (ref 22–32)
CREATININE: 0.44 mg/dL (ref 0.44–1.00)
GFR calc non Af Amer: >60 mL/min (ref >60)
GLUCOSE: 162 mg/dL — AB (ref 70–99)
Potassium: 3.7 mmol/L (ref 3.5–5.1)
Sodium: 141 mmol/L (ref 135–145)

## 2018-10-27 LAB — CBC
HEMATOCRIT: 36.5 % (ref 36.0–46.0)
HEMOGLOBIN: 11.8 g/dL — AB (ref 12.0–15.0)
MCH: 35.3 pg — ABNORMAL HIGH (ref 26.0–34.0)
MCHC: 32.3 g/dL (ref 30.0–36.0)
MCV: 109.3 fL — ABNORMAL HIGH (ref 80.0–100.0)
Platelets: 290 10*3/uL (ref 150–400)
RBC: 3.34 MIL/uL — ABNORMAL LOW (ref 3.87–5.11)
RDW: 14.1 % (ref 11.5–15.5)
WBC: 9.7 10*3/uL (ref 4.0–10.5)
nRBC: 0 % (ref 0.0–0.2)

## 2018-10-27 LAB — URINALYSIS, COMPLETE (UACMP) WITH MICROSCOPIC
Bacteria, UA: NONE SEEN
Bilirubin Urine: NEGATIVE
GLUCOSE, UA: NEGATIVE mg/dL
Hgb urine dipstick: NEGATIVE
Ketones, ur: NEGATIVE mg/dL
Leukocytes, UA: NEGATIVE
Nitrite: POSITIVE — AB
Protein, ur: NEGATIVE mg/dL
SPECIFIC GRAVITY, URINE: 1.023 (ref 1.005–1.030)
Squamous Epithelial / HPF: NONE SEEN (ref 0–5)
pH: 5 (ref 5.0–8.0)

## 2018-10-27 MED ORDER — AZITHROMYCIN 100 MG/5ML PO SUSR: 250.0000 mg | Freq: Every day | ORAL | 0 refills | Status: AC

## 2018-10-27 MED ORDER — AZITHROMYCIN 200 MG/5ML PO SUSR
500.0000 mg | Freq: Once | ORAL | Status: AC
  Administered 2018-10-27: 500 mg via ORAL
  Filled 2018-10-27: qty 15

## 2018-10-27 NOTE — ED Triage Notes (Signed)
Pt fell forward out of chair and hit the floor. Abrasion on face and nose.

## 2018-10-27 NOTE — Discharge Instructions (Addendum)
Follow-up closely with your primary doctor.  Your CT scan demonstrates that you may have scarring or possibly an early developing pneumonia in the left upper lung.  Please also take antibiotic as prescribed. Follow-up closely with patient's doctor.

## 2018-10-27 NOTE — ED Notes (Signed)
[facility] called to transport back to [facility]

## 2018-10-27 NOTE — ED Notes (Signed)
[facility] contacted to given report, no answer

## 2018-10-27 NOTE — ED Notes (Signed)
PT daughter signed due to pt baseline neuro status. PT in NAD at time of departure.

## 2018-10-27 NOTE — ED Provider Notes (Signed)
[facility] Emergency Department Provider Note   ____________________________________________   First MD Initiated Contact with Patient 10/27/18 1946     (approximate)  I have reviewed the triage vital signs and the nursing notes.   HISTORY  Chief Complaint Fall  EM caveat: Limited by severe dementia  HPI Theresa Sloan is a 77 y.o. female   who history is provided by patient's daughter who also reports she is healthcare power of attorney  The circumstances slightly unclear, but at her nursing home today she was felt to have slumped forward or falling forward out of the chair causing an abrasion and swelling across her forehead.  Daughter reports she has had many falls in the past, she has basically no mobility, can sit in a chair most all day long but sometimes has falls from the chair.  She reports the [facility] RN told her that some type of fall had occurred.  Her mom's been in her otherwise normal health which is very poor.  She has severe dementia to the point that some days she is nonverbal.  Daughter reports that her mom's behavior at present seems normal, but it is very difficult to assess because she sleeps almost all day and only occasionally will say a word or 2 some days none.  No recent illnesses or infections.   *Clarification, the patient resides in [facility] assisted living not nursing home  Past Medical History:  Diagnosis Date  . Alzheimer disease ([place])   . Anxiety   . Dementia ([place])   . 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 Gait difficulty 03/05/2015  . GERD (gastroesophageal reflux disease)   . Hyperlipidemia   . Hypertension   . Memory difficulty 08/07/2014  . TMJ (dislocation of temporomandibular joint)     Patient Active Problem List   Diagnosis Date Noted  . Fall   . Parkinson disease ([place])   . Advance care planning   . Goals of care, counseling/discussion   . Palliative care by specialist   .  Malnutrition of moderate degree 05/02/2018  . UTI (urinary tract infection) 05/01/2018  . Essential tremor 02/03/2016  . Subacute confusional state 02/03/2016  . Gait difficulty 03/05/2015  . Dementia ([place])   . Memory difficulty 08/07/2014  . 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 GERD (gastroesophageal reflux disease) 12/21/2013  . Insomnia 12/21/2013  . 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 Dry eyes 07/07/2012  . Epiretinal membrane (ERM) of left eye 07/07/2012  . 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 Pseudophakia of both eyes 07/07/2012    Past Surgical History:  Procedure Laterality Date  . CESAREAN SECTION  1992  . RETINAL TEAR REPAIR CRYOTHERAPY      Prior to Admission medications   Medication Sig Start Date End Date Taking? Authorizing Provider  acetaminophen (TYLENOL 8 HOUR) 650 MG CR tablet Take 650 mg by mouth every 6 (six) hours as needed for pain.   Yes [provider]  acetaminophen (TYLENOL) 500 MG tablet Take 500 mg by mouth 2 (two) times daily.    Yes [provider]  atorvastatin (LIPITOR) 80 MG tablet Take 80 mg by mouth every evening.    Yes [provider]  feeding supplement, ENSURE ENLIVE, (ENSURE ENLIVE) LIQD Take 237 mLs by mouth 2 (two) times daily between meals. 05/06/18  Yes Gouru, Illene Silver, MD  loratadine (CLARITIN) 10 MG tablet Take 10 mg by  mouth daily as needed for allergies.   Yes [provider]  megestrol (MEGACE) 40 MG/ML suspension Take 800 mg by mouth at bedtime.   Yes [provider]  memantine (NAMENDA) 10 MG tablet Take 1 tablet (10 mg total) by mouth 2 (two) times daily. 09/02/17  Yes Ward Givens, NP  Multiple Vitamin (THEREMS) TABS Take 1 tablet by mouth daily.   Yes [provider]  Multiple Vitamins-Minerals (PRESERVISION AREDS 2 PO) Take 1 capsule by mouth 2 (two) times daily.    Yes [provider]    nystatin [facility]) powder Apply topically 4 (four) times daily.   Yes [provider]  UNABLE TO FIND Take 4 oz by mouth every evening. Med Name: [facility]   Yes [provider]  valACYclovir (VALTREX) 500 MG tablet Take 500 mg by mouth daily.   Yes [provider]  azithromycin (ZITHROMAX) 100 MG/5ML suspension Take 12.5 mLs (250 mg total) by mouth daily for 4 days. 10/27/18 10/31/18  Delman Kitten, MD  carbidopa-levodopa-entacapone (STALEVO 100) 25-100-200 MG tablet Take 1 tablet by mouth 3 (three) times daily. 04/20/18   Kathrynn Ducking, MD  omeprazole (PRILOSEC) 20 MG capsule Take 1 capsule (20 mg total) by mouth daily. 10/13/16   [name], MD  propranolol ER (INDERAL LA) 60 MG 24 hr capsule TAKE ONE (1) CAPSULE EACH DAY 08/09/17   Kathrynn Ducking, MD  rivastigmine (EXELON) 9.5 mg/24hr APPLY 1 PATCH TO THE SKIN DAILY Patient taking differently: Place 9.5 mg onto the skin daily. Place in the center of back so patient cannot remove. 09/02/17   Ward Givens, NP    Allergies Patient has no known allergies.  Family History  Problem Relation Age of Onset  . Hypertension Sister   . Tremor Sister   . Brain cancer Mother     Social History Social History   Tobacco Use  . Smoking status: Never Smoker  . Smokeless tobacco: Never Used  Substance Use Topics  . Alcohol use: No  . Drug use: No    Review of Systems  EM caveat   ____________________________________________   PHYSICAL EXAM:  VITAL SIGNS: ED Triage Vitals  Enc Vitals Group     BP 10/27/18 1935 134/71     Pulse Rate 10/27/18 1935 96     Resp 10/27/18 1935 17     Temp 10/27/18 1931 98.7 F (37.1 C)     Temp Source 10/27/18 1931 Oral     SpO2 10/27/18 1935 96 %     Weight 10/27/18 1931 114 lb (51.7 kg)     Height --      Head Circumference --      Peak Flow --      Pain Score --      Pain Loc --      Pain Edu? --      Excl. in [place]? --     Constitutional: Alert and  very chronically ill-appearing.  Laying in bed in no distress. Eyes: Conjunctivae are normal. Head: Atraumatic except for an abrasion over the left anterior forehead and across the bridge of the nose.  There is no epistaxis or septal hematoma.  No deformity. Nose: No congestion/rhinnorhea. Mouth/Throat: Mucous membranes are moist. Neck: No stridor.  Cardiovascular: Normal rate, regular rhythm. Grossly normal heart sounds.  Good peripheral circulation. Respiratory: Normal respiratory effort.  No retractions. Lungs CTAB. Gastrointestinal: Soft and nontender. No distention. Musculoskeletal: No lower extremity tenderness nor edema. Neurologic: The patient  is nonverbal.  She does alert, tracks examiner.  Daughter at the bedside reports her behavior is normal, and some days they cannot get her to talk either.  Moves all extremities to touch, but weak in the lower extremities bilaterally.  Somewhat cachectic. Skin:  Skin is warm, dry and intact. No rash noted. Psychiatric: Mood and affect are very flat, difficult to assess due to severity dementia.  ____________________________________________   LABS (all labs ordered are listed, but only abnormal results are displayed)  Labs Reviewed  CBC - Abnormal; Notable for the following components:      Result Value   RBC 3.34 (*)    Hemoglobin 11.8 (*)    MCV 109.3 (*)    MCH 35.3 (*)    All other components within normal limits  BASIC METABOLIC PANEL - Abnormal; Notable for the following components:   Glucose, Bld 162 (*)    Calcium 8.6 (*)    All other components within normal limits  URINALYSIS, COMPLETE (UACMP) WITH MICROSCOPIC - Abnormal; Notable for the following components:   Color, Urine YELLOW (*)    APPearance CLEAR (*)    Nitrite POSITIVE (*)    All other components within normal limits  CBG MONITORING, ED   ____________________________________________  EKG  Reviewed and entered by me at 2015 Heart rate 100 QRS 120 QT  400 Sinus rhythm, incomplete left bundle branch block appearance.  No evidence of acute ST elevation or ischemia.  ____________________________________________  RADIOLOGY  Ct Head Wo Contrast  Result Date: 10/27/2018 CLINICAL DATA:  Fall from chair with facial injury. EXAM: CT HEAD WITHOUT CONTRAST CT CERVICAL SPINE WITHOUT CONTRAST TECHNIQUE: Multidetector CT imaging of the head and cervical spine was performed following the standard protocol without intravenous contrast. Multiplanar CT image reconstructions of the cervical spine were also generated. COMPARISON:  05/01/2018 head and cervical spine CT. FINDINGS: CT HEAD FINDINGS Brain: No evidence of parenchymal hemorrhage or extra-axial fluid collection. No mass lesion, mass effect, or midline shift. No CT evidence of acute infarction. Generalized cerebral volume loss. Nonspecific moderate subcortical and periventricular white matter hypodensity, most in keeping with chronic small vessel ischemic change. Cerebral ventricle sizes are stable and concordant with the degree of cerebral volume loss. Vascular: No acute abnormality. Skull: No evidence of calvarial fracture. Sinuses/Orbits: No fluid levels. Mucous retention cyst versus polyp in the inferior right maxillary sinus is stable. Other: Small left anterior frontal scalp contusion. The mastoid air cells are unopacified. CT CERVICAL SPINE FINDINGS Alignment: Normal cervical lordosis. No facet subluxation. Dens is well positioned between the lateral masses of C1. Stable minimal 2 mm retrolisthesis at C2-3 and C4-5 and minimal 2 mm anterolisthesis at C7-T1. Skull base and vertebrae: No acute fracture. No primary bone lesion or focal pathologic process. Soft tissues and spinal canal: No prevertebral edema. No visible canal hematoma. Disc levels: Moderate multilevel degenerative disc disease throughout the cervical spine. Moderate bilateral facet arthropathy, asymmetric to the left. Mild degenerative foraminal  stenosis on the right at C3-4. Mild to moderate degenerative foraminal stenosis on the right at C4-5. Moderate bilateral degenerative foraminal stenosis at C5-6 and C6-7. Upper chest: Subcentimeter faintly calcified peripheral apical left upper lobe granuloma is stable. New mild patchy consolidation in the peripheral apical left upper lobe. Other: Visualized mastoid air cells appear clear. No discrete thyroid nodules. No pathologically enlarged cervical nodes. IMPRESSION: CT HEAD: 1. Small left anterior frontal scalp contusion. No evidence of calvarial fracture. 2. No evidence of acute intracranial abnormality. 3. Generalized  cerebral volume loss and moderate chronic small vessel ischemic changes in the cerebral white matter. CT CERVICAL SPINE: 1. No cervical spine fracture or facet subluxation. 2. Moderate multilevel degenerative changes in the cervical spine as detailed. 3. New mild patchy consolidation in the peripheral apical left upper lobe, probably infectious or inflammatory. Suggest attention on follow-up chest radiographs or chest CT as clinically warranted. Electronically Signed   By: Ilona Sorrel M.D.   On: 10/27/2018 20:58   Ct Cervical Spine Wo Contrast  Result Date: 10/27/2018 CLINICAL DATA:  Fall from chair with facial injury. EXAM: CT HEAD WITHOUT CONTRAST CT CERVICAL SPINE WITHOUT CONTRAST TECHNIQUE: Multidetector CT imaging of the head and cervical spine was performed following the standard protocol without intravenous contrast. Multiplanar CT image reconstructions of the cervical spine were also generated. COMPARISON:  05/01/2018 head and cervical spine CT. FINDINGS: CT HEAD FINDINGS Brain: No evidence of parenchymal hemorrhage or extra-axial fluid collection. No mass lesion, mass effect, or midline shift. No CT evidence of acute infarction. Generalized cerebral volume loss. Nonspecific moderate subcortical and periventricular white matter hypodensity, most in keeping with chronic small  vessel ischemic change. Cerebral ventricle sizes are stable and concordant with the degree of cerebral volume loss. Vascular: No acute abnormality. Skull: No evidence of calvarial fracture. Sinuses/Orbits: No fluid levels. Mucous retention cyst versus polyp in the inferior right maxillary sinus is stable. Other: Small left anterior frontal scalp contusion. The mastoid air cells are unopacified. CT CERVICAL SPINE FINDINGS Alignment: Normal cervical lordosis. No facet subluxation. Dens is well positioned between the lateral masses of C1. Stable minimal 2 mm retrolisthesis at C2-3 and C4-5 and minimal 2 mm anterolisthesis at C7-T1. Skull base and vertebrae: No acute fracture. No primary bone lesion or focal pathologic process. Soft tissues and spinal canal: No prevertebral edema. No visible canal hematoma. Disc levels: Moderate multilevel degenerative disc disease throughout the cervical spine. Moderate bilateral facet arthropathy, asymmetric to the left. Mild degenerative foraminal stenosis on the right at C3-4. Mild to moderate degenerative foraminal stenosis on the right at C4-5. Moderate bilateral degenerative foraminal stenosis at C5-6 and C6-7. Upper chest: Subcentimeter faintly calcified peripheral apical left upper lobe granuloma is stable. New mild patchy consolidation in the peripheral apical left upper lobe. Other: Visualized mastoid air cells appear clear. No discrete thyroid nodules. No pathologically enlarged cervical nodes. IMPRESSION: CT HEAD: 1. Small left anterior frontal scalp contusion. No evidence of calvarial fracture. 2. No evidence of acute intracranial abnormality. 3. Generalized cerebral volume loss and moderate chronic small vessel ischemic changes in the cerebral white matter. CT CERVICAL SPINE: 1. No cervical spine fracture or facet subluxation. 2. Moderate multilevel degenerative changes in the cervical spine as detailed. 3. New mild patchy consolidation in the peripheral apical left upper  lobe, probably infectious or inflammatory. Suggest attention on follow-up chest radiographs or chest CT as clinically warranted. Electronically Signed   By: Ilona Sorrel M.D.   On: 10/27/2018 20:58   Dg Chest Port 1 View  Result Date: 10/27/2018 CLINICAL DATA:  Fall EXAM: PORTABLE CHEST 1 VIEW COMPARISON:  05/04/2018 FINDINGS: Low lung volume. No focal opacity or pleural effusion. Cardiomediastinal silhouette within normal limits. Aortic atherosclerosis. No pneumothorax. IMPRESSION: No active disease. Electronically Signed   By: Donavan Foil M.D.   On: 10/27/2018 20:41      CT and x-ray imaging reviewed by me.  No acute traumatic injuries except for small scalp contusion.  Of note however, there is some possible patchy consolidation in  the left apical lobe.  This is however not demonstrated on chest x-ray.  ____________________________________________   PROCEDURES  Procedure(s) performed: None  Procedures  Critical Care performed: No  ____________________________________________   INITIAL IMPRESSION / ASSESSMENT AND PLAN / ED COURSE  Pertinent labs & imaging results that were available during my care of the patient were reviewed by me and considered in my medical decision making (see chart for details).   Patient returns for a fall.  In the description it somewhat hard to know, but suspect likely a mechanical fall out of her chair that she sits in.  She has an abrasion across the forehead.  Was unwitnessed, no report of syncope or preceding illness.  Family currently reports she is at her baseline which is very poor, severe dementia.  The patient is DO NOT RESUSCITATE and has hospice care involved.  Discussion with her family we will provide laboratory testing, and obtain imaging studies.  Goals of care discussed with family.  No heroic measures.  After assessment, patient resting comfortably with normal vital signs.  No evidence of major traumatic injury, appears to be at her  baseline status.  Will treat with azithromycin in the event that she does have a early infiltrate or consolidation in the left upper lobe, though no evidence of acute respiratory illness, fever, leukocytosis or other noted symptoms at this time.  Discussed with patient's daughter and family at the bedside, she will be returned to her home at [facility].  Follow-up with her primary doctor.  Return precautions and treatment recommendations and follow-up discussed with the patient's daughter who is agreeable with the plan.       ____________________________________________   FINAL CLINICAL IMPRESSION(S) / ED DIAGNOSES  Final diagnoses:  Left upper lobe consolidation ([place])  Abrasion  Closed head injury, initial encounter        Note:  This document was prepared using Dragon voice recognition software and may include unintentional dictation errors       Delman Kitten, MD 10/27/18 2349

## 2018-11-07 DIAGNOSIS — R0989 Other specified symptoms and signs involving the circulatory and respiratory systems: Secondary | ICD-10-CM | POA: Diagnosis not present

## 2018-11-07 DIAGNOSIS — R05 Cough: Secondary | ICD-10-CM | POA: Diagnosis not present

## 2018-11-08 DIAGNOSIS — J181 Lobar pneumonia, unspecified organism: Secondary | ICD-10-CM | POA: Diagnosis not present

## 2018-11-09 ENCOUNTER — Telehealth: Payer: Self-pay

## 2018-11-09 NOTE — Telephone Encounter (Signed)
[facility] faxed note;[name] Pearline Cables pts daughter (DPR signed) request cb; pts mom is at [facility] and was dx with pneumonia and pt is wheezing. [name] request cb from Dr Silvio Pate. Unable to reach Anastasia Pall to get additional info.

## 2018-11-09 NOTE — Telephone Encounter (Signed)
Mearl Latin had called because she received a [facility] Note.

## 2018-11-09 NOTE — Telephone Encounter (Signed)
Spoke to daughter She has had a hard time accepting the severity of her dementia (now on hospice care) Was concerned about rattling last night after drinking---discussed that it is from her inability to swallow properly. Will have Rollene Fare NP check her today just in case Albuterol prn ordered

## 2018-11-09 NOTE — Telephone Encounter (Signed)
She is not a [place] pt. I will see what Dr Silvio Pate says as to who she needs to contact or what he says to do.

## 2018-11-09 NOTE — Telephone Encounter (Signed)
Maudie Mercury (pt daughter) was call back to talk to medical assistant she stated someone called but didn't leave message  Best number [phone].

## 2018-11-14 DIAGNOSIS — R600 Localized edema: Secondary | ICD-10-CM | POA: Diagnosis not present

## 2018-11-15 DIAGNOSIS — R509 Fever, unspecified: Secondary | ICD-10-CM | POA: Diagnosis not present

## 2018-11-15 DIAGNOSIS — R05 Cough: Secondary | ICD-10-CM | POA: Diagnosis not present

## 2018-11-16 ENCOUNTER — Emergency Department

## 2018-11-16 ENCOUNTER — Emergency Department
Admission: EM | Admit: 2018-11-16 | Discharge: 2018-11-16 | Disposition: A | Discharge status: Home / Self Care | Attending: Emergency Medicine | Admitting: Emergency Medicine

## 2018-11-16 ENCOUNTER — Other Ambulatory Visit: Payer: Self-pay

## 2018-11-16 DIAGNOSIS — G309 Alzheimer's disease, unspecified: Secondary | ICD-10-CM | POA: Diagnosis not present

## 2018-11-16 DIAGNOSIS — F419 Anxiety disorder, unspecified: Secondary | ICD-10-CM | POA: Diagnosis not present

## 2018-11-16 DIAGNOSIS — Z79899 Other long term (current) drug therapy: Secondary | ICD-10-CM | POA: Diagnosis not present

## 2018-11-16 DIAGNOSIS — R0902 Hypoxemia: Secondary | ICD-10-CM | POA: Diagnosis not present

## 2018-11-16 DIAGNOSIS — R279 Unspecified lack of coordination: Secondary | ICD-10-CM | POA: Diagnosis not present

## 2018-11-16 DIAGNOSIS — I10 Essential (primary) hypertension: Secondary | ICD-10-CM | POA: Insufficient documentation

## 2018-11-16 DIAGNOSIS — G20 Parkinson's disease: Secondary | ICD-10-CM | POA: Diagnosis not present

## 2018-11-16 DIAGNOSIS — Z743 Need for continuous supervision: Secondary | ICD-10-CM | POA: Diagnosis not present

## 2018-11-16 DIAGNOSIS — F0280 Dementia in other diseases classified elsewhere without behavioral disturbance: Secondary | ICD-10-CM | POA: Diagnosis not present

## 2018-11-16 DIAGNOSIS — R05 Cough: Secondary | ICD-10-CM | POA: Diagnosis not present

## 2018-11-16 DIAGNOSIS — R404 Transient alteration of awareness: Secondary | ICD-10-CM | POA: Diagnosis not present

## 2018-11-16 DIAGNOSIS — R2242 Localized swelling, mass and lump, left lower limb: Secondary | ICD-10-CM | POA: Diagnosis not present

## 2018-11-16 DIAGNOSIS — R0602 Shortness of breath: Secondary | ICD-10-CM | POA: Insufficient documentation

## 2018-11-16 LAB — CBC WITH DIFFERENTIAL/PLATELET
Abs Immature Granulocytes: 0.12 10*3/uL — ABNORMAL HIGH (ref 0.00–0.07)
BASOS PCT: 1 %
Basophils Absolute: 0.1 10*3/uL (ref 0.0–0.1)
Eosinophils Absolute: 0.4 10*3/uL (ref 0.0–0.5)
Eosinophils Relative: 3 %
HCT: 37.8 % (ref 36.0–46.0)
Hemoglobin: 12.4 g/dL (ref 12.0–15.0)
IMMATURE GRANULOCYTES: 1 %
Lymphocytes Relative: 20 %
Lymphs Abs: 2.6 10*3/uL (ref 0.7–4.0)
MCH: 34.9 pg — ABNORMAL HIGH (ref 26.0–34.0)
MCHC: 32.8 g/dL (ref 30.0–36.0)
MCV: 106.5 fL — ABNORMAL HIGH (ref 80.0–100.0)
Monocytes Absolute: 1.2 10*3/uL — ABNORMAL HIGH (ref 0.1–1.0)
Monocytes Relative: 10 %
NEUTROS PCT: 65 %
NRBC: 0 % (ref 0.0–0.2)
Neutro Abs: 8.2 10*3/uL — ABNORMAL HIGH (ref 1.7–7.7)
Platelets: 347 10*3/uL (ref 150–400)
RBC: 3.55 MIL/uL — ABNORMAL LOW (ref 3.87–5.11)
RDW: 14 % (ref 11.5–15.5)
WBC: 12.5 10*3/uL — ABNORMAL HIGH (ref 4.0–10.5)

## 2018-11-16 LAB — BASIC METABOLIC PANEL
Anion gap: 11 (ref 5–15)
BUN: 23 mg/dL (ref 8–23)
CO2: 21 mmol/L — ABNORMAL LOW (ref 22–32)
Calcium: 8.9 mg/dL (ref 8.9–10.3)
Chloride: 113 mmol/L — ABNORMAL HIGH (ref 98–111)
Creatinine, Ser: 0.73 mg/dL (ref 0.44–1.00)
GFR calc Af Amer: >60 mL/min (ref >60)
GFR calc non Af Amer: >60 mL/min (ref >60)
Glucose, Bld: 154 mg/dL — ABNORMAL HIGH (ref 70–99)
POTASSIUM: 3.8 mmol/L (ref 3.5–5.1)
Sodium: 145 mmol/L (ref 135–145)

## 2018-11-16 LAB — PROTIME-INR
INR: 1.18
Prothrombin Time: 14.9 s (ref 11.4–15.2)

## 2018-11-16 LAB — TROPONIN I
Troponin I: 0.04 ng/mL (ref <0.03)
Troponin I: 0.04 ng/mL

## 2018-11-16 MED ORDER — IOPAMIDOL (ISOVUE-370) INJECTION 76%
75.0000 mL | Freq: Once | INTRAVENOUS | Status: AC | PRN
  Administered 2018-11-16: 75 mL via INTRAVENOUS

## 2018-11-16 NOTE — ED Notes (Signed)
Pts family members at bedside, updated and pt given mouth swabs at this time.

## 2018-11-16 NOTE — ED Triage Notes (Signed)
Pt to ED via EMS from [facility]. Per ems facility called to have pt transported over here to have CT scan to rule out PE. Per facility pt has confimred DVT in left thigh and facility states pt has had incresed shortness of breath. Pt has bronchitis and is not on blood thinners at this time. Pt has hx of dementia. VSS. Pt is at baseline per facility/

## 2018-11-16 NOTE — ED Notes (Signed)
Pt's daughter and POA Anastasia Pall called and said that she cannot be here right now because she has to be with her son, who just had biopsy. Gave her an update and told that we will have more results after CT angio results. She requests callback or she will call us later.

## 2018-11-16 NOTE — ED Provider Notes (Signed)
[facility] Emergency Department Provider Note ____________________________________________   First MD Initiated Contact with Patient 11/16/18 1538     (approximate)  I have reviewed the triage vital signs and the nursing notes.   HISTORY  Chief Complaint Shortness of Breath  Level 5 caveat: History of present illness limited due to dementia  HPI Theresa Sloan is a 77 y.o. female with PMH as noted below who presents for evaluation for possible PE.  Per her nursing home paperwork, she has been noted to be increasingly short of breath and was just diagnosed with a left lower extremity DVT.  The patient was recently treated for pneumonia.  Chest x-ray today did not show focal infiltrate.  The patient herself is unable to provide any history.  Past Medical History:  Diagnosis Date  . Alzheimer disease ([place])   . Anxiety   . Dementia ([place])   . 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 Gait difficulty 03/05/2015  . GERD (gastroesophageal reflux disease)   . Hyperlipidemia   . Hypertension   . Memory difficulty 08/07/2014  . TMJ (dislocation of temporomandibular joint)     Patient Active Problem List   Diagnosis Date Noted  . Fall   . Parkinson disease ([place])   . Advance care planning   . Goals of care, counseling/discussion   . Palliative care by specialist   . Malnutrition of moderate degree 05/02/2018  . UTI (urinary tract infection) 05/01/2018  . Essential tremor 02/03/2016  . Subacute confusional state 02/03/2016  . Gait difficulty 03/05/2015  . Dementia ([place])   . Memory difficulty 08/07/2014  . 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 GERD (gastroesophageal reflux disease) 12/21/2013  . Insomnia 12/21/2013  . 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 Dry eyes 07/07/2012  . Epiretinal membrane (ERM) of left eye 07/07/2012  . 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 Nonexudative age-related macular degeneration  07/07/2012  . Pseudophakia of both eyes 07/07/2012    Past Surgical History:  Procedure Laterality Date  . CESAREAN SECTION  1992  . RETINAL TEAR REPAIR CRYOTHERAPY      Prior to Admission medications   Medication Sig Start Date End Date Taking? Authorizing Provider  acetaminophen (TYLENOL) 325 MG tablet Take by mouth 3 (three) times daily as needed.   Yes [provider]  acetaminophen (TYLENOL) 650 MG suppository Place 650 mg rectally every 4 (four) hours as needed.   Yes [provider]  albuterol (PROVENTIL) (2.5 MG/3ML) 0.083% nebulizer solution Take 2.5 mg by nebulization every 6 (six) hours as needed for wheezing or shortness of breath.   Yes [provider]  feeding supplement, ENSURE ENLIVE, (ENSURE ENLIVE) LIQD Take 237 mLs by mouth 2 (two) times daily between meals. 05/06/18  Yes Gouru, Illene Silver, MD  furosemide (LASIX) 40 MG tablet Take 40 mg by mouth daily.   Yes [provider]  guaiFENesin 200 MG/10ML SOLN Take 10 mLs by mouth every 4 (four) hours as needed.   Yes [provider]  loratadine (CLARITIN) 10 MG tablet Take 10 mg by mouth daily as needed for allergies.   Yes [provider]  megestrol (MEGACE) 40 MG/ML suspension Take 400 mg by mouth at bedtime.    Yes [provider]  memantine (NAMENDA) 10 MG tablet Take 1 tablet (10 mg total) by mouth 2 (two) times daily. 09/02/17  Yes Ward Givens, NP  Multiple Vitamin (THEREMS) TABS Take 1 tablet by mouth daily.  Yes [provider]  nystatin [facility]) powder Apply topically daily as needed.    Yes [provider]  valACYclovir (VALTREX) 500 MG tablet Take 500 mg by mouth daily.   Yes [provider]  atorvastatin (LIPITOR) 80 MG tablet Take 80 mg by mouth every evening.     [provider]  carbidopa-levodopa-entacapone (STALEVO 100) 25-100-200 MG tablet Take 1 tablet by mouth 3 (three) times daily. Patient not taking: Reported on  11/16/2018 04/20/18   Kathrynn Ducking, MD  Multiple Vitamins-Minerals (PRESERVISION AREDS 2 PO) Take 1 capsule by mouth 2 (two) times daily.     [provider]  omeprazole (PRILOSEC) 20 MG capsule Take 1 capsule (20 mg total) by mouth daily. Patient not taking: Reported on 11/16/2018 10/13/16   [name], MD  propranolol ER (INDERAL LA) 60 MG 24 hr capsule TAKE ONE (1) CAPSULE EACH DAY Patient not taking: Reported on 11/16/2018 08/09/17   Kathrynn Ducking, MD  rivastigmine (EXELON) 9.5 mg/24hr APPLY 1 PATCH TO THE SKIN DAILY Patient not taking: Reported on 11/16/2018 09/02/17   Ward Givens, NP  UNABLE TO FIND Take 4 oz by mouth every evening. Med Name: [facility]    [provider]    Allergies Patient has no known allergies.  Family History  Problem Relation Age of Onset  . Hypertension Sister   . Tremor Sister   . Brain cancer Mother     Social History Social History   Tobacco Use  . Smoking status: Never Smoker  . Smokeless tobacco: Never Used  Substance Use Topics  . Alcohol use: No  . Drug use: No    Review of Systems Level 5 caveat: Unable to obtain review of systems due to dementia    ____________________________________________   PHYSICAL EXAM:  VITAL SIGNS: ED Triage Vitals  Enc Vitals Group     BP 11/16/18 1517 (!) 143/95     Pulse Rate 11/16/18 1517 (!) 107     Resp 11/16/18 1517 16     Temp 11/16/18 1517 99.6 F (37.6 C)     Temp Source 11/16/18 1517 Axillary     SpO2 11/16/18 1517 97 %     Weight 11/16/18 1512 113 lb 15.7 oz (51.7 kg)     Height 11/16/18 1512 5\' 5"  (1.651 m)     Head Circumference --      Peak Flow --      Pain Score --      Pain Loc --      Pain Edu? --      Excl. in [place]? --     Constitutional: Alert, confused.  Relatively comfortable appearing. Eyes: Conjunctivae are normal.  EOMI. Head: Atraumatic. Nose: No congestion/rhinnorhea. Mouth/Throat: Mucous membranes are slightly dry.     Neck: Normal range of motion.  Cardiovascular: Borderline tachycardic, regular rhythm. Grossly normal heart sounds.  Good peripheral circulation. Respiratory: Normal respiratory effort.  No retractions.  Somewhat coarse breath sounds bilaterally. Gastrointestinal: Soft and nontender. No distention.  Genitourinary: No flank tenderness. Musculoskeletal: No lower extremity edema.  Mild left lower extremity swelling.  Extremities warm and well perfused.  Neurologic: Nonverbal.  Motor intact in all extremities. Skin:  Skin is warm and dry. No rash noted. Psychiatric: Unable to assess.  ____________________________________________   LABS (all labs ordered are listed, but only abnormal results are displayed)  Labs Reviewed  BASIC METABOLIC PANEL - Abnormal; Notable for the following components:      Result Value  Chloride 113 (*)    CO2 21 (*)    Glucose, Bld 154 (*)    All other components within normal limits  CBC WITH DIFFERENTIAL/PLATELET - Abnormal; Notable for the following components:   WBC 12.5 (*)    RBC 3.55 (*)    MCV 106.5 (*)    MCH 34.9 (*)    Neutro Abs 8.2 (*)    Monocytes Absolute 1.2 (*)    Abs Immature Granulocytes 0.12 (*)    All other components within normal limits  TROPONIN I - Abnormal; Notable for the following components:   Troponin I 0.04 (*)    All other components within normal limits  TROPONIN I - Abnormal; Notable for the following components:   Troponin I 0.04 (*)    All other components within normal limits  PROTIME-INR   ____________________________________________  EKG  ED ECG REPORT I, Arta Silence, the attending physician, personally viewed and interpreted this ECG.  Date: 11/16/2018 EKG Time: 1708 Rate: 87 Rhythm: normal sinus rhythm (read by machine as atrial flutter) QRS Axis: normal Intervals: Incomplete LBBB ST/T Wave abnormalities: Nonspecific abnormalities Narrative Interpretation: no evidence of acute ischemia; no  significant change when compared to EKG of 04/08/2018  ____________________________________________  RADIOLOGY  CXR: Right hilar density with no focal opacity CT chest: No acute PE ____________________________________________   PROCEDURES  Procedure(s) performed: No  Procedures  Critical Care performed: No ____________________________________________   INITIAL IMPRESSION / ASSESSMENT AND PLAN / ED COURSE  Pertinent labs & imaging results that were available during my care of the patient were reviewed by me and considered in my medical decision making (see chart for details).  77 year old female with PMH as noted above presents with apparent worsening shortness of breath, and concern for PE after she was diagnosed with a DVT in the left leg.  I reviewed the past medical records in [place]; she was most recently seen in the ED last month after a fall with negative work-up.  Per the nursing home paperwork she was being treated for pneumonia over the last week.  Nursing home paperwork revealed ultrasound performed today showing left lower extremity DVT.  On exam the patient is relatively comfortable appearing and apparently at her baseline.  She has a borderline temperature and heart rate but otherwise normal vital signs.  Her O2 saturation is in the mid to high 90s on room air.  There is no increased work of breathing.    Differential includes either continued symptoms related to her recent pneumonia, acute bronchitis, or possible PE.  I obtained lab work-up and chest x-ray.  Given that the chest x-ray shows no focal infiltrate I will proceed with CT to rule out PE.  ----------------------------------------- 7:45 PM on 11/16/2018 -----------------------------------------  The lab work-up is unremarkable except for slightly elevated WBC count, which is consistent with the patient's recent pneumonia.  Troponin is minimally elevated.  I repeated it after 3 hours and there was no  significant change.  There is no evidence of ischemia on the EKG.  CT chest shows likely chronic appearing stenoses due to calcifications in the bilateral subclavian arteries, with no evidence of PE.  The patient's clinical status is unchanged.  I discussed the results of the work-up with her son who is present in the ED, as well as with her daughter Anastasia Pall over the phone.  The patient is ordered for Eliquis at the nursing home.  She is stable for discharge at this time.  I discussed return precautions  with the family members and they expressed understanding, and I provided them in the discharge paperwork. ____________________________________________   FINAL CLINICAL IMPRESSION(S) / ED DIAGNOSES  Final diagnoses:  Shortness of breath      NEW MEDICATIONS STARTED DURING THIS VISIT:  New Prescriptions   No medications on file     Note:  This document was prepared using Dragon voice recognition software and may include unintentional dictation errors.    Arta Silence, MD 11/16/18 2026

## 2018-11-16 NOTE — Discharge Instructions (Addendum)
CT of the chest shows no evidence of a blood clot.  Since there is a blood clot in the leg, the Eliquis should be given as prescribed.  Theresa Sloan should return to the emergency department for new or worsening shortness of breath, fever, weakness, low oxygen level, or any other new or worsening symptoms that are of concern.

## 2018-11-17 DIAGNOSIS — I82402 Acute embolism and thrombosis of unspecified deep veins of left lower extremity: Secondary | ICD-10-CM | POA: Diagnosis not present

## 2018-11-24 ENCOUNTER — Telehealth: Payer: Self-pay | Admitting: *Deleted

## 2018-11-30 NOTE — Telephone Encounter (Signed)
Received call from patient daughter Maudie Mercury.   Reports that patient passed at facility.   MD made aware.

## 2018-11-30 DEATH — deceased

## 2019-04-08 IMAGING — CT CT CERVICAL SPINE W/O CM
3 of 6 series · 12 of 33 positions shown, 13 images · non-contrast
Comparison: CT HEAD and cervical spine April 08, 2018

CLINICAL DATA: Unwitnessed fall, posterior head hematoma. At
baseline. History of dementia, gait abnormality, hypertension and
hyperlipidemia.

EXAM:
CT HEAD WITHOUT CONTRAST
CT CERVICAL SPINE WITHOUT CONTRAST
TECHNIQUE: Multidetector CT imaging of the head and cervical spine was
performed following the standard protocol without intravenous
contrast. Multiplanar CT image reconstructions of the cervical spine
were also generated.

[Series 4: c spine soft · axial · 0.40mm/px · z∈[+39,+135]mm · 4 of 72 slices shown, 5 images]
[im 16/72  soft-tissue]
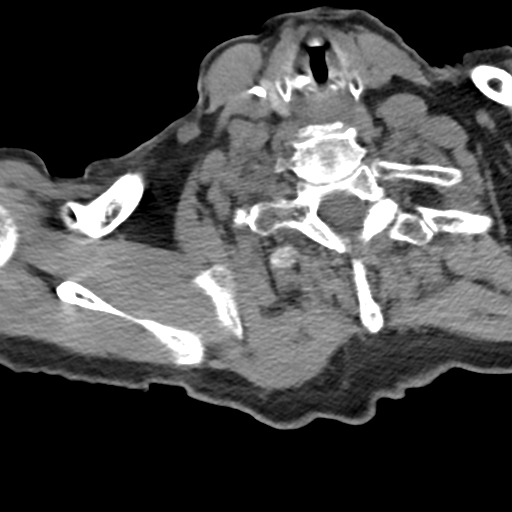
[im 16/72  bone]
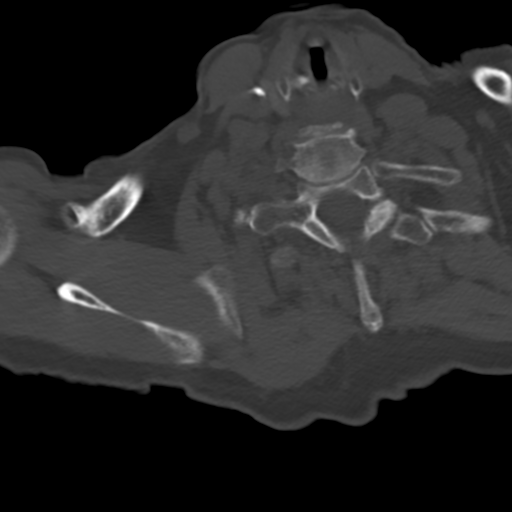
[im 32/72  bone]
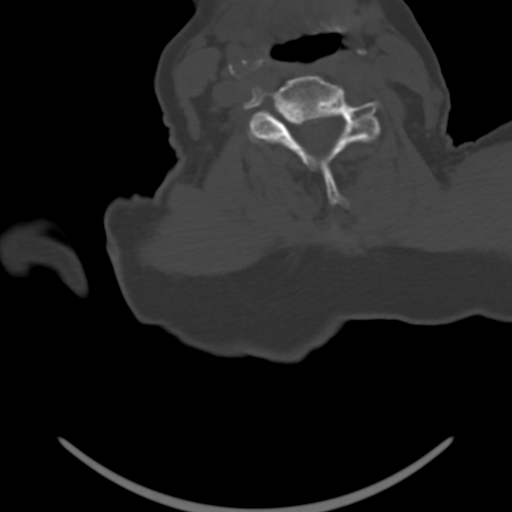
[im 48/72  bone]
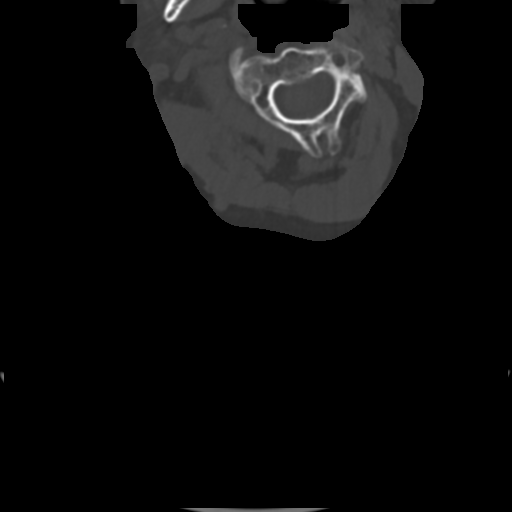
[im 64/72  bone]
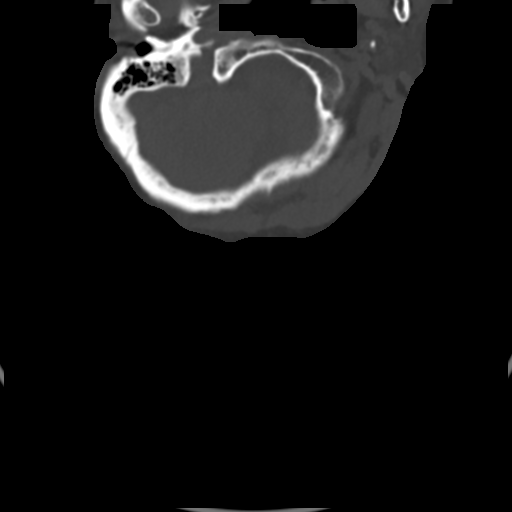

[Series 6: coronal soft tissue · coronal · 0.31mm/px · 3 of 68 slices shown]
[im 17/68  bone]
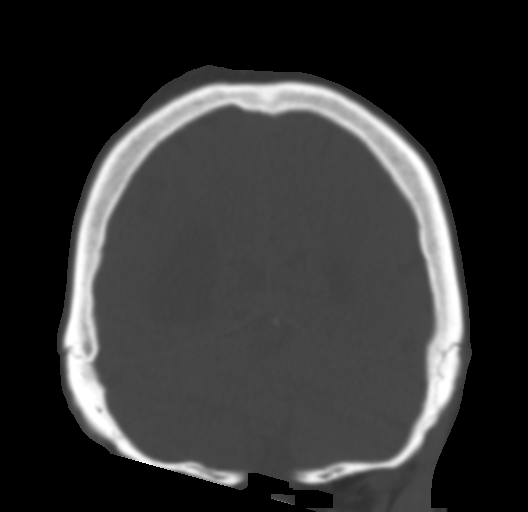
[im 34/68  bone]
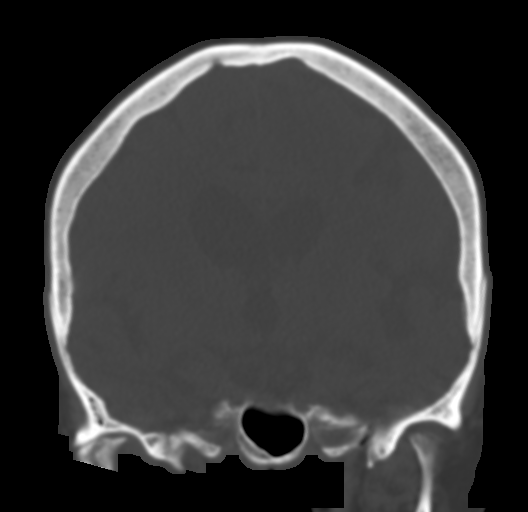
[im 51/68  bone]
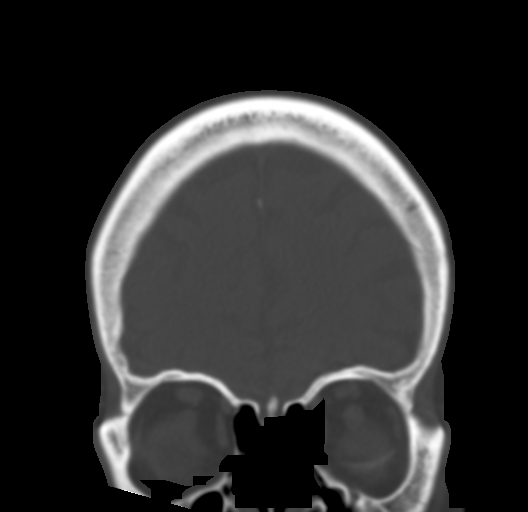

[Series 8: sagittal bone · sagittal · 0.22mm/px · 5 of 60 slices shown]
[im 10/60  bone]
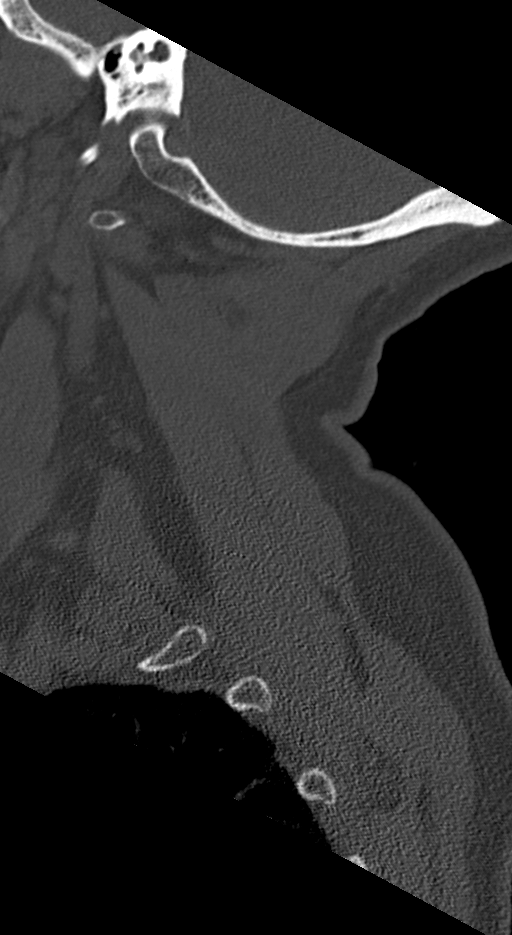
[im 20/60  bone]
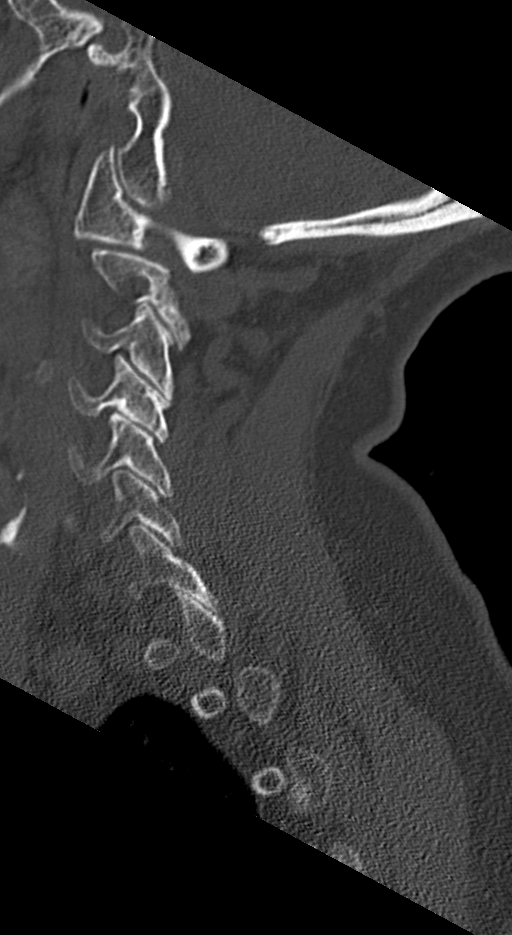
[im 30/60  bone]
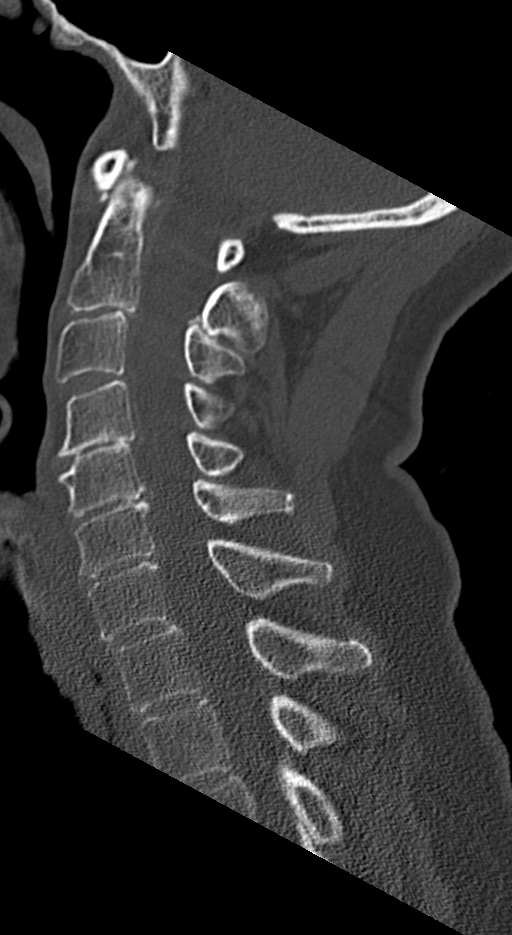
[im 40/60  bone]
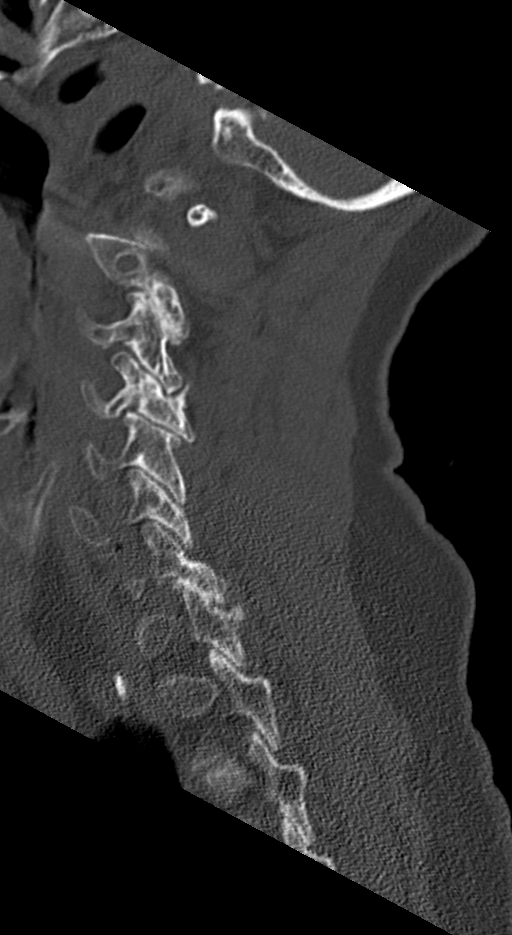
[im 50/60  bone]
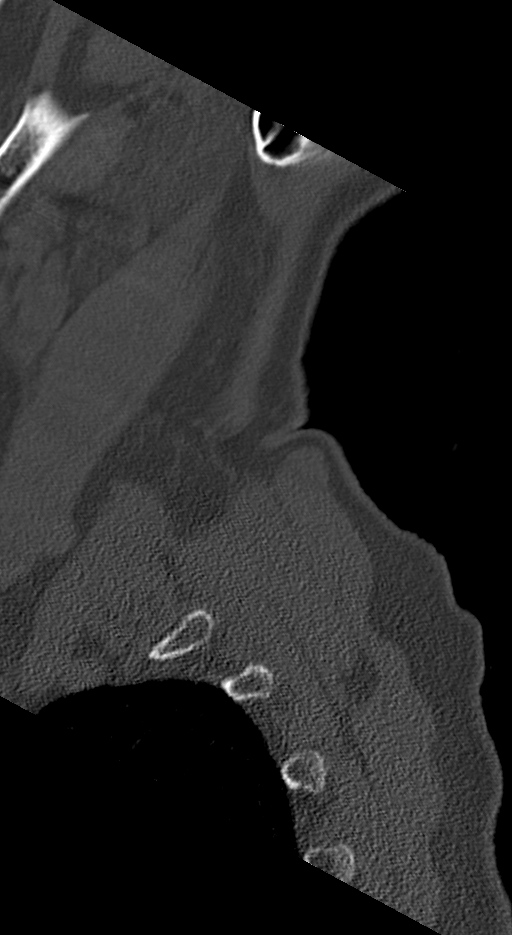

[12 of 33 positions shown; findings below may reference images not displayed]

FINDINGS: CT HEAD FINDINGS

BRAIN: No intraparenchymal hemorrhage, mass effect nor midline
shift. Moderate to severe parenchymal brain volume loss. No
hydrocephalus. Patchy supratentorial white matter hypodensities less
than expected for patient's age, though non-specific are most
compatible with chronic small vessel ischemic disease. No acute
large vascular territory infarcts. No abnormal extra-axial fluid
collections. Basal cisterns are patent.

VASCULAR: Moderate calcific atherosclerosis of the carotid siphons.

SKULL: No skull fracture. Moderate RIGHT greater than LEFT
temporomandibular osteoarthrosis. Old RIGHT nasal bone fracture.
Small residual RIGHT frontal scalp hematoma. Moderate RIGHT parietal
scalp hematoma without subcutaneous gas or radiopaque foreign
bodies.

SINUSES/ORBITS: The mastoid air-cells and included paranasal sinuses
are well-aerated.The included ocular globes and orbital contents are
non-suspicious. Status post bilateral ocular lens implants and LEFT
scleral banding.

OTHER: None.

CT CERVICAL SPINE FINDINGS

ALIGNMENT: Maintained lordosis. Stable minimal grade 1 C2-3
retrolisthesis and C7-T1 anterolisthesis.

SKULL BASE AND VERTEBRAE: Cervical vertebral bodies and posterior
elements are intact. Severe C4-5 disc height loss, moderate to
severe C2-3, C5-6 and C6-7 with endplate sclerosis and marginal
spurring compatible with degenerative discs, unchanged. No
destructive bony lesions.

SOFT TISSUES AND SPINAL CANAL: Nonacute. Moderate calcific
atherosclerosis carotid bifurcations.

DISC LEVELS: No significant osseous canal stenosis. Severe bilateral
C3-4, RIGHT C4-5, bilateral C5-6 and LEFT C6-7 neural foraminal
narrowing.

UPPER CHEST: Lung apices are clear.

OTHER: None.
IMPRESSION: CT HEAD:

1. No acute intracranial process. New RIGHT scalp hematoma, subacute
RIGHT frontal scalp hematoma. No skull fracture.
2. Stable examination including moderate to severe parenchymal brain
volume loss.

CT CERVICAL SPINE:

1. No acute fracture or malalignment.
2. Stable minimal grade 1 C2-3 and C7-T1 spondylolisthesis.
